# Patient Record
Sex: Male | Born: 1955 | Race: White | Hispanic: No | Smoking: Former smoker
Health system: Southern US, Community
[De-identification: ages and names within clinical notes are randomized; demographics above are authoritative.]

## PROBLEM LIST (undated history)

## (undated) DIAGNOSIS — Z860100 Personal history of colon polyps, unspecified: Secondary | ICD-10-CM

## (undated) DIAGNOSIS — Z8249 Family history of ischemic heart disease and other diseases of the circulatory system: Secondary | ICD-10-CM

## (undated) DIAGNOSIS — M1990 Unspecified osteoarthritis, unspecified site: Secondary | ICD-10-CM

## (undated) DIAGNOSIS — I719 Aortic aneurysm of unspecified site, without rupture: Secondary | ICD-10-CM

## (undated) DIAGNOSIS — J189 Pneumonia, unspecified organism: Secondary | ICD-10-CM

## (undated) DIAGNOSIS — I739 Peripheral vascular disease, unspecified: Secondary | ICD-10-CM

## (undated) DIAGNOSIS — R519 Headache, unspecified: Secondary | ICD-10-CM

## (undated) DIAGNOSIS — I10 Essential (primary) hypertension: Secondary | ICD-10-CM

## (undated) DIAGNOSIS — N189 Chronic kidney disease, unspecified: Secondary | ICD-10-CM

## (undated) DIAGNOSIS — R0600 Dyspnea, unspecified: Secondary | ICD-10-CM

## (undated) DIAGNOSIS — I2510 Atherosclerotic heart disease of native coronary artery without angina pectoris: Secondary | ICD-10-CM

## (undated) DIAGNOSIS — G5602 Carpal tunnel syndrome, left upper limb: Secondary | ICD-10-CM

## (undated) DIAGNOSIS — I671 Cerebral aneurysm, nonruptured: Secondary | ICD-10-CM

## (undated) DIAGNOSIS — K219 Gastro-esophageal reflux disease without esophagitis: Secondary | ICD-10-CM

## (undated) DIAGNOSIS — C6290 Malignant neoplasm of unspecified testis, unspecified whether descended or undescended: Secondary | ICD-10-CM

## (undated) DIAGNOSIS — D352 Benign neoplasm of pituitary gland: Secondary | ICD-10-CM

## (undated) HISTORY — DX: Malignant neoplasm of unspecified testis, unspecified whether descended or undescended: C62.90

## (undated) HISTORY — DX: Personal history of colonic polyps: Z86.010

## (undated) HISTORY — DX: Personal history of colon polyps, unspecified: Z86.0100

## (undated) HISTORY — DX: Unspecified osteoarthritis, unspecified site: M19.90

## (undated) HISTORY — PX: INGUINAL HERNIA REPAIR: SUR1180

## (undated) HISTORY — PX: OTHER SURGICAL HISTORY: SHX169

## (undated) HISTORY — PX: CARPAL TUNNEL RELEASE: SHX101

## (undated) HISTORY — DX: Family history of ischemic heart disease and other diseases of the circulatory system: Z82.49

## (undated) HISTORY — PX: MOHS SURGERY: SUR867

## (undated) HISTORY — DX: Benign neoplasm of pituitary gland: D35.2

## (undated) HISTORY — PX: APPENDECTOMY: SHX54

## (undated) HISTORY — PX: TONSILLECTOMY: SUR1361

## (undated) HISTORY — DX: Cerebral aneurysm, nonruptured: I67.1

## (undated) HISTORY — DX: Essential (primary) hypertension: I10

## (undated) HISTORY — PX: JOINT REPLACEMENT: SHX530

---

## 1976-10-11 DIAGNOSIS — C6290 Malignant neoplasm of unspecified testis, unspecified whether descended or undescended: Secondary | ICD-10-CM

## 1976-10-11 HISTORY — PX: OTHER SURGICAL HISTORY: SHX169

## 1976-10-11 HISTORY — PX: ABDOMINAL EXPLORATION SURGERY: SHX538

## 1976-10-11 HISTORY — DX: Malignant neoplasm of unspecified testis, unspecified whether descended or undescended: C62.90

## 1999-12-22 ENCOUNTER — Emergency Department (HOSPITAL_COMMUNITY): Admission: EM | Admit: 1999-12-22 | Discharge: 1999-12-22 | Payer: Self-pay | Admitting: Emergency Medicine

## 1999-12-22 ENCOUNTER — Encounter: Payer: Self-pay | Admitting: Emergency Medicine

## 2003-05-12 DIAGNOSIS — D352 Benign neoplasm of pituitary gland: Secondary | ICD-10-CM | POA: Insufficient documentation

## 2003-05-12 DIAGNOSIS — D353 Benign neoplasm of craniopharyngeal duct: Secondary | ICD-10-CM

## 2003-05-12 HISTORY — DX: Benign neoplasm of pituitary gland: D35.2

## 2003-05-23 ENCOUNTER — Ambulatory Visit (HOSPITAL_COMMUNITY): Admission: RE | Admit: 2003-05-23 | Discharge: 2003-05-23 | Payer: Self-pay | Admitting: Internal Medicine

## 2003-05-23 ENCOUNTER — Encounter: Payer: Self-pay | Admitting: Internal Medicine

## 2003-06-12 HISTORY — PX: OTHER SURGICAL HISTORY: SHX169

## 2005-06-04 ENCOUNTER — Ambulatory Visit: Payer: Self-pay | Admitting: Internal Medicine

## 2005-12-07 ENCOUNTER — Ambulatory Visit: Payer: Self-pay | Admitting: Internal Medicine

## 2006-03-22 ENCOUNTER — Ambulatory Visit: Payer: Self-pay | Admitting: Internal Medicine

## 2006-06-07 ENCOUNTER — Ambulatory Visit: Payer: Self-pay | Admitting: Internal Medicine

## 2006-06-07 LAB — CONVERTED CEMR LAB: PSA: 0.62 ng/mL

## 2006-12-07 ENCOUNTER — Ambulatory Visit: Payer: Self-pay | Admitting: Internal Medicine

## 2006-12-07 LAB — CONVERTED CEMR LAB
Basophils Absolute: 0 10*3/uL (ref 0.0–0.1)
Basophils Relative: 0.3 % (ref 0.0–1.0)
Eosinophils Absolute: 0.3 10*3/uL (ref 0.0–0.6)
Eosinophils Relative: 2.6 % (ref 0.0–5.0)
H Pylori IgG: NEGATIVE
HCT: 48.5 % (ref 39.0–52.0)
Hemoglobin: 16.8 g/dL (ref 13.0–17.0)
Lymphocytes Relative: 21.6 % (ref 12.0–46.0)
MCHC: 34.7 g/dL (ref 30.0–36.0)
MCV: 93.4 fL (ref 78.0–100.0)
Monocytes Absolute: 0.5 10*3/uL (ref 0.2–0.7)
Monocytes Relative: 4.3 % (ref 3.0–11.0)
Neutro Abs: 7.5 10*3/uL (ref 1.4–7.7)
Neutrophils Relative %: 71.2 % (ref 43.0–77.0)
Platelets: 198 10*3/uL (ref 150–400)
RBC: 5.2 M/uL (ref 4.22–5.81)
RDW: 12.2 % (ref 11.5–14.6)
WBC: 10.6 10*3/uL — ABNORMAL HIGH (ref 4.5–10.5)

## 2006-12-09 ENCOUNTER — Ambulatory Visit: Payer: Self-pay | Admitting: Surgery

## 2006-12-10 HISTORY — PX: ESOPHAGOGASTRODUODENOSCOPY: SHX1529

## 2006-12-14 ENCOUNTER — Ambulatory Visit: Payer: Self-pay | Admitting: Internal Medicine

## 2007-01-02 ENCOUNTER — Ambulatory Visit: Payer: Self-pay | Admitting: Unknown Physician Specialty

## 2007-06-08 ENCOUNTER — Encounter: Payer: Self-pay | Admitting: Internal Medicine

## 2007-06-08 DIAGNOSIS — I10 Essential (primary) hypertension: Secondary | ICD-10-CM | POA: Insufficient documentation

## 2007-06-08 DIAGNOSIS — I671 Cerebral aneurysm, nonruptured: Secondary | ICD-10-CM | POA: Insufficient documentation

## 2007-06-08 DIAGNOSIS — Z8547 Personal history of malignant neoplasm of testis: Secondary | ICD-10-CM | POA: Insufficient documentation

## 2007-06-14 ENCOUNTER — Ambulatory Visit: Payer: Self-pay | Admitting: Unknown Physician Specialty

## 2007-06-22 ENCOUNTER — Ambulatory Visit: Payer: Self-pay | Admitting: Internal Medicine

## 2007-06-22 DIAGNOSIS — N529 Male erectile dysfunction, unspecified: Secondary | ICD-10-CM | POA: Insufficient documentation

## 2007-06-23 LAB — CONVERTED CEMR LAB
ALT: 32 units/L (ref 0–53)
AST: 25 units/L (ref 0–37)
Albumin: 3.9 g/dL (ref 3.5–5.2)
Alkaline Phosphatase: 79 units/L (ref 39–117)
BUN: 13 mg/dL (ref 6–23)
Basophils Absolute: 0 10*3/uL (ref 0.0–0.1)
Basophils Relative: 0.3 % (ref 0.0–1.0)
Bilirubin, Direct: 0.2 mg/dL (ref 0.0–0.3)
CO2: 27 meq/L (ref 19–32)
Calcium: 9 mg/dL (ref 8.4–10.5)
Chloride: 105 meq/L (ref 96–112)
Creatinine, Ser: 1.3 mg/dL (ref 0.4–1.5)
Eosinophils Absolute: 0.2 10*3/uL (ref 0.0–0.6)
Eosinophils Relative: 2.5 % (ref 0.0–5.0)
Free T4: 0.6 ng/dL (ref 0.6–1.6)
GFR calc Af Amer: 75 mL/min
GFR calc non Af Amer: 62 mL/min
Glucose, Bld: 88 mg/dL (ref 70–99)
HCT: 46.7 % (ref 39.0–52.0)
Hemoglobin: 16.3 g/dL (ref 13.0–17.0)
Lymphocytes Relative: 25.3 % (ref 12.0–46.0)
MCHC: 35 g/dL (ref 30.0–36.0)
MCV: 93.5 fL (ref 78.0–100.0)
Monocytes Absolute: 0.5 10*3/uL (ref 0.2–0.7)
Monocytes Relative: 4.7 % (ref 3.0–11.0)
Neutro Abs: 6.6 10*3/uL (ref 1.4–7.7)
Neutrophils Relative %: 67.2 % (ref 43.0–77.0)
Phosphorus: 3.7 mg/dL (ref 2.3–4.6)
Platelets: 177 10*3/uL (ref 150–400)
Potassium: 4.7 meq/L (ref 3.5–5.1)
Prolactin: 5.6 ng/mL
RBC: 4.99 M/uL (ref 4.22–5.81)
RDW: 12 % (ref 11.5–14.6)
Sodium: 138 meq/L (ref 135–145)
TSH: 1.49 microintl units/mL (ref 0.35–5.50)
Total Bilirubin: 0.9 mg/dL (ref 0.3–1.2)
Total Protein: 5.9 g/dL — ABNORMAL LOW (ref 6.0–8.3)
WBC: 9.8 10*3/uL (ref 4.5–10.5)

## 2008-01-11 ENCOUNTER — Telehealth: Payer: Self-pay | Admitting: Internal Medicine

## 2008-01-22 ENCOUNTER — Telehealth (INDEPENDENT_AMBULATORY_CARE_PROVIDER_SITE_OTHER): Payer: Self-pay | Admitting: *Deleted

## 2008-07-11 ENCOUNTER — Encounter: Payer: Self-pay | Admitting: Family Medicine

## 2008-07-12 ENCOUNTER — Ambulatory Visit: Payer: Self-pay | Admitting: Family Medicine

## 2008-07-12 DIAGNOSIS — K219 Gastro-esophageal reflux disease without esophagitis: Secondary | ICD-10-CM | POA: Insufficient documentation

## 2008-07-17 ENCOUNTER — Ambulatory Visit: Payer: Self-pay | Admitting: Cardiovascular Disease

## 2008-07-24 ENCOUNTER — Ambulatory Visit: Payer: Self-pay | Admitting: Cardiology

## 2008-07-24 ENCOUNTER — Ambulatory Visit: Payer: Self-pay

## 2008-07-30 ENCOUNTER — Ambulatory Visit: Payer: Self-pay | Admitting: Cardiology

## 2008-09-20 ENCOUNTER — Encounter: Payer: Self-pay | Admitting: Internal Medicine

## 2008-11-29 ENCOUNTER — Ambulatory Visit: Payer: Self-pay | Admitting: Family Medicine

## 2008-11-29 DIAGNOSIS — K5732 Diverticulitis of large intestine without perforation or abscess without bleeding: Secondary | ICD-10-CM | POA: Insufficient documentation

## 2008-11-29 LAB — CONVERTED CEMR LAB
Bilirubin Urine: NEGATIVE
Blood in Urine, dipstick: NEGATIVE
Glucose, Urine, Semiquant: NEGATIVE
Ketones, urine, test strip: NEGATIVE
Nitrite: NEGATIVE
Specific Gravity, Urine: 1.01
Urobilinogen, UA: 0.2
WBC Urine, dipstick: NEGATIVE
pH: 6

## 2008-12-02 LAB — CONVERTED CEMR LAB
Basophils Absolute: 0 10*3/uL (ref 0.0–0.1)
Basophils Relative: 0 % (ref 0.0–3.0)
Eosinophils Absolute: 0.2 10*3/uL (ref 0.0–0.7)
Eosinophils Relative: 2.5 % (ref 0.0–5.0)
HCT: 48.4 % (ref 39.0–52.0)
Hemoglobin: 17 g/dL (ref 13.0–17.0)
Lymphocytes Relative: 25.6 % (ref 12.0–46.0)
MCHC: 35.1 g/dL (ref 30.0–36.0)
MCV: 94.6 fL (ref 78.0–100.0)
Monocytes Absolute: 0.3 10*3/uL (ref 0.1–1.0)
Monocytes Relative: 3.5 % (ref 3.0–12.0)
Neutro Abs: 5.7 10*3/uL (ref 1.4–7.7)
Neutrophils Relative %: 68.4 % (ref 43.0–77.0)
Platelets: 184 10*3/uL (ref 150–400)
RBC: 5.11 M/uL (ref 4.22–5.81)
RDW: 12.1 % (ref 11.5–14.6)
WBC: 8.4 10*3/uL (ref 4.5–10.5)

## 2009-07-09 ENCOUNTER — Ambulatory Visit: Payer: Self-pay | Admitting: Internal Medicine

## 2009-07-23 ENCOUNTER — Ambulatory Visit: Payer: Self-pay | Admitting: Cardiology

## 2009-07-30 ENCOUNTER — Encounter: Payer: Self-pay | Admitting: Cardiology

## 2009-07-30 ENCOUNTER — Ambulatory Visit: Payer: Self-pay | Admitting: Cardiology

## 2009-08-01 LAB — CONVERTED CEMR LAB
AST: 17 units/L (ref 0–37)
Albumin: 4.1 g/dL (ref 3.5–5.2)
Alkaline Phosphatase: 81 units/L (ref 39–117)
BUN: 15 mg/dL (ref 6–23)
Calcium: 8.8 mg/dL (ref 8.4–10.5)
Chloride: 108 meq/L (ref 96–112)
Creatinine, Ser: 1.13 mg/dL (ref 0.40–1.50)
Glucose, Bld: 100 mg/dL — ABNORMAL HIGH (ref 70–99)
HDL: 40 mg/dL (ref >39)
TSH: 1.608 microintl units/mL (ref 0.350–4.500)
Total CHOL/HDL Ratio: 5.1
Triglycerides: 243 mg/dL — ABNORMAL HIGH (ref <150)

## 2009-08-04 ENCOUNTER — Telehealth: Payer: Self-pay | Admitting: Cardiology

## 2009-09-22 ENCOUNTER — Ambulatory Visit: Payer: Self-pay | Admitting: Internal Medicine

## 2009-09-22 ENCOUNTER — Encounter: Payer: Self-pay | Admitting: Cardiology

## 2009-09-22 DIAGNOSIS — E785 Hyperlipidemia, unspecified: Secondary | ICD-10-CM | POA: Insufficient documentation

## 2009-09-24 LAB — CONVERTED CEMR LAB
ALT: 24 units/L (ref 0–53)
BUN: 12 mg/dL (ref 6–23)
CO2: 23 meq/L (ref 19–32)
Calcium: 8.9 mg/dL (ref 8.4–10.5)
Chloride: 106 meq/L (ref 96–112)
Cholesterol: 211 mg/dL — ABNORMAL HIGH (ref 0–200)
Creatinine, Ser: 1.1 mg/dL (ref 0.40–1.50)
Glucose, Bld: 94 mg/dL (ref 70–99)
HDL: 41 mg/dL (ref >39)
Total Bilirubin: 0.4 mg/dL (ref 0.3–1.2)
Total CHOL/HDL Ratio: 5.1
Triglycerides: 155 mg/dL — ABNORMAL HIGH (ref <150)
VLDL: 31 mg/dL (ref 0–40)

## 2010-01-06 ENCOUNTER — Ambulatory Visit: Payer: Self-pay | Admitting: Internal Medicine

## 2010-01-07 LAB — CONVERTED CEMR LAB: Free T4: 0.7 ng/dL (ref 0.6–1.6)

## 2010-08-05 ENCOUNTER — Ambulatory Visit: Payer: Self-pay | Admitting: Internal Medicine

## 2010-08-05 DIAGNOSIS — R42 Dizziness and giddiness: Secondary | ICD-10-CM | POA: Insufficient documentation

## 2010-08-11 ENCOUNTER — Telehealth: Payer: Self-pay | Admitting: Internal Medicine

## 2010-09-28 ENCOUNTER — Encounter: Payer: Self-pay | Admitting: Internal Medicine

## 2010-11-10 NOTE — Assessment & Plan Note (Signed)
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--increased work but more success also  Still smoking Terrible reaction to chantix Did quit on patch in 2002  No real exercise--hopes to go bird hunting  Has a place behind left ear Long standing--since he was a child Now somewhat larger Notes slight discomfort at it  Preventive Screening-Counseling & Management  Alcohol-Tobacco     Smoking Status: current  Allergies: 1)  Chantix (Varenicline Tartrate) 2)  Wellbutrin (Bupropion Hcl) 3)  Zegerid (Omeprazole-Sodium Bicarbonate)  Past History:  Past medical, surgical, family and social histories (including risk factors) reviewed for relevance to current acute and chronic problems.  Past Medical History: Reviewed history from 11/29/2008 and no changes required. 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 Bil. ing hernia - child Tonsillectomy Right testes removed 1978 Abd. exploration  1978 Prolactinoma excision [name]) 09/04 Colon/EGD - hiatal hernia 03/08 Appendectomy (at same time as lymph node dissection)  Stress Nuclear neg, 10/09  Family History: Reviewed history from 06/08/2007 and no changes required. Dad died of suicide.  Had glioblastoma Pat aunt died of lung cancer Brother with melanoma Cancer strong in family  Social  History: Reviewed history from 11/29/2008 and no changes required. Marital Status: Divorced Long term monogamous relationship Children: None Occupation: Public relations account executive Alcohol use-yes Current Smoker  Review of Systems General:  Denies sleep disorder; weight stable wears seat belt. Eyes:  Denies double vision and vision loss-1 eye. ENT:  Complains of decreased hearing and ringing in ears; no change in chronic symptoms Uses hearing protection teeth okay--regular with dentist. CV:  Complains of chest pain or discomfort and lightheadness; denies difficulty breathing at night, difficulty breathing while lying down, fainting, palpitations, and shortness of breath with exertion; occ tightness in chest if he gets tense. Resp:  Complains of cough; denies shortness of breath; AM congestion but then clears. GI:  Complains of indigestion; denies abdominal pain, bloody stools, change in bowel habits, dark tarry stools, nausea, and vomiting; more reflux due to stress and no exercise using OTC acid reducer with good result (ranitidine). GU:  Complains of erectile dysfunction; denies urinary frequency and urinary hesitancy; still uses cialis---not as good as viagra but he got flushed with it. MS:  Complains of joint pain; denies joint swelling; occ knee and shoulder pain--no meds. Derm:  Denies lesion(s) and rash. Neuro:  Complains of headaches; denies weakness; left arm numbness if holds it wrong--quickly resolves occ headaches--sinus?. Psych:  Denies anxiety and depression; no ongoing mood issues despite stress Rarely uses valium to help sleep. Heme:  Denies abnormal bruising and enlarge lymph nodes. Allergy:  Complains of seasonal allergies and sneezing; seens to have some spring symptoms.  Physical Exam  General:  alert and normal appearance.   Eyes:  pupils equal, pupils round, pupils reactive  to light, and no optic disk abnormalities.   Ears:  R ear normal and L ear normal.   Mouth:   no erythema and no lesions.   Neck:  supple, no masses, no thyromegaly, no carotid bruits, and no cervical lymphadenopathy.   Lungs:  normal respiratory effort and normal breath sounds.   Heart:  normal rate, regular rhythm, no murmur, and no gallop.   Abdomen:  soft and non-tender.   Rectal:  no hemorrhoids and no masses.   Prostate:  no gland enlargement and no nodules.   Msk:  no joint tenderness and no joint swelling.   Pulses:  2+ in feet Extremities:  no edema Neurologic:  alert & oriented X3, strength normal in all extremities, and gait normal.   Skin:  no rashes and no suspicious lesions.   Has  ~1cm raised lesion behind left ear (?dermatofibroma) Axillary Nodes:  No palpable lymphadenopathy Psych:  normally interactive, good eye contact, not anxious appearing, and not depressed appearing.     Impression & Recommendations:  Problem # 1:  PREVENTIVE HEALTH CARE (ICD-V70.0) Assessment Comment Only discussed exercise and smoking cessation due for PSA  Problem # 2:  HYPERTENSION (ICD-401.9) Assessment: Unchanged reasonable though suboptimal control he should be able to bring it down with lifestyle measures  His updated medication list for this problem includes:    Amlodipine Besylate 5 Mg Tabs (Amlodipine besylate) .Marland Kitchen... 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 1/2 -1 tab about 1 hour before sex 2)  Amlodipine Besylate 5 Mg Tabs (Amlodipine besylate) .Marland Kitchen.. 1 tab by mouth daily for high blood pressure 3)  Aspirin 81 Mg Tbec (Aspirin) .... Take one tablet by mouth daily as needed 4)  Niacin Cr 1000 Mg Cr-tabs (Niacin) .... 2 tabs daily 5)  Vitamin E 1000 Unit Caps (Vitamin e) .... Takes 4000 units once daily 6)  Acid Reducer 75 Mg Tabs (Ranitidine hcl) .Marland Kitchen.. 1 daily  Other Orders: TLB-PSA (Prostate Specific Antigen) (84153-PSA)  Patient Instructions: 1)  Please try loratadine 10mg  1-2 tabs daily for allergy symptoms 2)  Please stop smoking using the patch for 2-3 months 3)  Please schedule a follow-up appointment in 1 year for physical  Current Allergies (reviewed today): CHANTIX (VARENICLINE TARTRATE) WELLBUTRIN (BUPROPION HCL) ZEGERID (OMEPRAZOLE-SODIUM BICARBONATE)  Appended Document: CPX/DLO Blood work is fine Thyroid and prostate tests are both normal

## 2010-11-10 NOTE — Progress Notes (Signed)
Summary: pt requests phone call  Phone Note Call from Patient Call back at Home Phone [phone]   Caller: Patient Call For: Cindee Salt MD Summary of Call: Pt is asking that you call him today, he wants to discuss the meds that you gave him for vertigo. Initial call taken by: Lowella Petties CMA, AAMA,  August 11, 2010 10:02 AM  Follow-up for Phone Call        message left Cindee Salt MD  August 11, 2010 1:54 PM   Seems to have had some improvement with the meclizine but does make him a bit groggy Esp bad in AM when he first moves his head Has had better relief with baby aspirin--taking two times a day Okay to continue that and reduce daytime meclizine to help sedation can expect he will be able to wean off these in the upcoming time Follow-up by: Cindee Salt MD,  August 12, 2010 8:03 AM

## 2010-11-10 NOTE — Assessment & Plan Note (Signed)
Summary: ?VERTIGO/CLE   Vital Signs:  Patient profile:   55 year old male Weight:      216 pounds BMI:     32.96 Temp:     98.5 degrees F oral Pulse rate:   76 / minute Pulse rhythm:   regular BP sitting:   138 / 88  (left arm) Cuff size:   large  Vitals Entered By: Mervin Hack CMA Duncan Dull) (August 05, 2010 9:38 AM) CC: DIZZY   History of Present Illness: Having bad vertigo Started about 2 weeks ago at boat show in [place] felt swimmy headed while brushing teeth seemed to get better after shower and coffee  Now gets worse anytime he lies down horizontal Has had some nausea  No true rotatory symptoms but does have sense of movement  Did try baby aspirin once and he felt better 30 minutes later Tried single bonine without much help  No sinus symptoms BP has been okay  Chronic tinnitus and hearing loss  Allergies: 1)  Chantix (Varenicline Tartrate) 2)  Wellbutrin (Bupropion Hcl) 3)  Zegerid (Omeprazole-Sodium Bicarbonate)  Past History:  Past medical, surgical, family and social histories (including risk factors) reviewed for relevance to current acute and chronic problems.  Past Medical History: Reviewed history from 11/29/2008 and no changes required. 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 Bil. ing hernia - child Tonsillectomy Right testes removed 1978 Abd. exploration  1978 Prolactinoma excision [name]) 09/04 Colon/EGD - hiatal hernia 03/08 Appendectomy (at same time as lymph node dissection)  Stress Nuclear neg, Aug 15, 2023  Family History: Dad died of suicide.  Had glioblastoma and aortic aneurysm Pat aunt died of lung cancer Brother with melanoma Cancer strong in family  Social History: Reviewed history from 01/06/2010  and no changes required. Marital Status: Divorced Long term monogamous relationship Children: None Occupation: Public relations account executive Alcohol use-yes Current Smoker  Review of Systems       weight is down a few pounds no edema No speech or swallowing problems No focal weakness  Physical Exam  General:  alert and normal appearance.   Eyes:  pupils equal, pupils round, pupils reactive to light, no optic disk abnormalities, and no nystagmus.   Mouth:  no erythema and no exudates.   Neck:  supple, no masses, no thyromegaly, no carotid bruits, and no cervical lymphadenopathy.   Lungs:  normal respiratory effort, no intercostal retractions, no accessory muscle use, and normal breath sounds.   Heart:  normal rate, regular rhythm, no murmur, and no gallop.   Extremities:  no edema Neurologic:  alert & oriented X3, cranial nerves II-XII intact, strength normal in all extremities, gait normal, finger-to-nose normal, and Romberg negative.   Psych:  normally interactive, good eye contact, not anxious appearing, and not depressed appearing.     Impression & Recommendations:  Problem # 1:  VERTIGO (ICD-780.4) Assessment New  clearly seems to be vestibular Nothing to suggest intracranial process or Meniere's neuro exam reassuring  discussed salt restriction  will try meclizine he did use one valium and he seemed to be worse  His updated medication list for this problem includes:    Meclizine Hcl 25 Mg Tabs (Meclizine hcl) .Marland Kitchen... 1  tab by mouth three times a day for vertigo. wean when symptoms are gone for several days  Complete Medication List: 1)  Cialis 20 Mg Tabs (Tadalafil) .... 1/2 -1 tab about 1 hour before sex 2)  Amlodipine Besylate 5 Mg Tabs (Amlodipine besylate) .Marland Kitchen.. 1 tab by mouth daily for high blood pressure 3)  Aspirin 81 Mg Tbec (Aspirin) .... Take one tablet by mouth daily as needed 4)  Niacin Cr 1000 Mg Cr-tabs (Niacin) .... 2 tabs daily 5)  Vitamin E 1000 Unit Caps  (Vitamin e) .... Takes 4000 units once daily 6)  Acid Reducer 75 Mg Tabs (Ranitidine hcl) .Marland Kitchen.. 1 daily 7)  Meclizine Hcl 25 Mg Tabs (Meclizine hcl) .Marland Kitchen.. 1  tab by mouth three times a day for vertigo. wean when symptoms are gone for several days  Patient Instructions: 1)  Please call for reevaluation if the vertigo doesn't get better 2)  Keep regular follow up Prescriptions: MECLIZINE HCL 25 MG TABS (MECLIZINE HCL) 1  tab by mouth three times a day for vertigo. Wean when symptoms are gone for several days  #90 x 1   Entered and Authorized by:   Cindee Salt MD   Signed by:   Cindee Salt MD on 08/05/2010   Method used:   Electronically to        [facility]  [address] [id number] (retail)       [address]       [place], [place]  [address]       Ph: [phone] or [phone]       Fax: [phone]   RxID:   [id number]    Orders Added: 1)  Est. Patient Level IV [32951]    Current Allergies (reviewed today): CHANTIX (VARENICLINE TARTRATE) WELLBUTRIN (BUPROPION HCL) ZEGERID (OMEPRAZOLE-SODIUM BICARBONATE)

## 2010-11-12 NOTE — Letter (Signed)
Summary: Cancer Registry Form/[facility]  Cancer Registry Form/[facility]   Imported By: Lanelle Bal 10/06/2010 15:12:11  _____________________________________________________________________  External Attachment:    Type:   Image     Comment:   External Document

## 2010-11-30 ENCOUNTER — Other Ambulatory Visit: Payer: Self-pay | Admitting: Family Medicine

## 2010-11-30 ENCOUNTER — Ambulatory Visit (INDEPENDENT_AMBULATORY_CARE_PROVIDER_SITE_OTHER): Payer: 59 | Admitting: Family Medicine

## 2010-11-30 ENCOUNTER — Encounter: Payer: Self-pay | Admitting: Family Medicine

## 2010-11-30 DIAGNOSIS — R42 Dizziness and giddiness: Secondary | ICD-10-CM

## 2010-11-30 DIAGNOSIS — R269 Unspecified abnormalities of gait and mobility: Secondary | ICD-10-CM | POA: Insufficient documentation

## 2010-12-01 ENCOUNTER — Encounter: Payer: Self-pay | Admitting: Family Medicine

## 2010-12-02 ENCOUNTER — Encounter (INDEPENDENT_AMBULATORY_CARE_PROVIDER_SITE_OTHER): Payer: Self-pay | Admitting: *Deleted

## 2010-12-02 ENCOUNTER — Other Ambulatory Visit: Payer: Self-pay | Admitting: Internal Medicine

## 2010-12-02 ENCOUNTER — Other Ambulatory Visit (INDEPENDENT_AMBULATORY_CARE_PROVIDER_SITE_OTHER): Payer: 59

## 2010-12-02 ENCOUNTER — Encounter: Payer: Self-pay | Admitting: Internal Medicine

## 2010-12-02 DIAGNOSIS — R42 Dizziness and giddiness: Secondary | ICD-10-CM

## 2010-12-02 LAB — CREATININE, SERUM: Creatinine, Ser: 1.3 mg/dL (ref 0.4–1.5)

## 2010-12-03 ENCOUNTER — Encounter: Payer: Self-pay | Admitting: Family Medicine

## 2010-12-03 ENCOUNTER — Ambulatory Visit
Admission: RE | Admit: 2010-12-03 | Discharge: 2010-12-03 | Disposition: A | Discharge status: Home / Self Care | Payer: 59 | Source: Ambulatory Visit | Attending: Family Medicine | Admitting: Family Medicine

## 2010-12-03 DIAGNOSIS — R42 Dizziness and giddiness: Secondary | ICD-10-CM

## 2010-12-03 MED ORDER — GADOBENATE DIMEGLUMINE 529 MG/ML IV SOLN
20.0000 mL | Freq: Once | INTRAVENOUS | Status: AC | PRN
  Administered 2010-12-03: 20 mL via INTRAVENOUS

## 2010-12-08 NOTE — Assessment & Plan Note (Signed)
Summary: VERTIGO   Vital Signs:  Patient profile:   55 year old male Height:      68 inches Weight:      221 pounds BMI:     33.72 Temp:     98.1 degrees F oral Pulse rate:   76 / minute Pulse rhythm:   regular BP sitting:   120 / 90  (left arm) Cuff size:   large  Vitals Entered By: Benny Lennert CMA Duncan Dull) (November 30, 2010 12:32 PM)  History of Present Illness: 55 year old male with notable history for prolactinoma s/p resection in 2004 and a father with Glioblastoma dx at 64 who presents with a 3 day history of acute vertigo, but with vertigo sensations for 5 months with several exacerbations.   ome vertigo in the fall. woke up yesterday omrning, and two mornings in a row. Now has lasted up into the morning. Dizzy here just sitting. At various times with severe vertigo, awoke from the morning.   Took a meclizine a couple of hours ago. Made his sensations improve somewhat.   Distantly, had a problem for the first time [place]. then had some relatively brief amount of dizziness.   He may be coming down with a viral illness as well.    Allergies: 1)  Chantix (Varenicline Tartrate) 2)  Wellbutrin (Bupropion Hcl) 3)  Zegerid (Omeprazole-Sodium Bicarbonate)  Past History:  Past medical, surgical, family and social histories (including risk factors) reviewed, and no changes noted (except as noted below).  Past Medical History: Reviewed history from 11/29/2008 and no changes required. 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 Bil. ing hernia - child Tonsillectomy Right testes removed 1978 Abd. exploration  1978 Prolactinoma excision [name]) 09/04 Colon/EGD - hiatal hernia 03/08 Appendectomy (at same time as lymph node dissection)  Stress Nuclear neg, 10/09   Family  History: Reviewed history from 08/05/2010 and no changes required. Dad died of suicide.  Had glioblastoma and aortic aneurysm Pat aunt died of lung cancer Brother with melanoma Cancer strong in family  Social History: Reviewed history from 01/06/2010 and no changes required. Marital Status: Divorced Long term monogamous relationship Children: None Occupation: Public relations account executive Alcohol use-yes Current Smoker  Review of Systems      See HPI General:  Complains of fatigue; denies chills and fever. GI:  Complains of nausea. Neuro:  Complains of disturbances in coordination, poor balance, and sensation of room spinning; denies inability to speak, memory loss, numbness, and tremors.  Physical Exam  General:  Well-developed,well-nourished,in no acute distress; alert,appropriate and cooperative throughout examination Head:  normocephalic and atraumatic.   Ears:  no external deformities.   LAID SUPINE, WITH RAPID HEAD ROTATION, NO NYSTAGMUS, BUT WITH SEVERE VERTIGO ON SITTING UP. Mouth:  Oral mucosa and oropharynx without lesions or exudates.  Teeth in good repair. Neck:  No deformities, masses, or tenderness noted. Lungs:  normal respiratory effort.   Psych:  Cognition and judgment appear intact. Alert and cooperative with normal attention span and concentration. No apparent delusions, illusions, hallucinations   Detailed Neurologic Exam  Speech:    Speech is normal; fluent and spontaneous with normal comprehension Cognition:    The patient is oriented to person, place, and time; memory intact; language fluent; normal attention, concentration, and fund of knowledge Cranial Nerves:    The pupils are equal, round, and reactive to light. The  fundi are normal and spontaneous venous pulsations are present. Visual fields are full to finger confrontation. Extraocular movements are intact. Trigeminal sensation is intact and the muscles of mastication are normal. The face is symmetric.  The palate elevates in the midline. Voice is normal. Shoulder shrug is normal. The tongue has normal motion without fasciculations.  Coordination:    finger nose normal romberg normal  Gait:    UNABLE TO COMPLETE HEEL TO TOE WALKING, IMBALANCE Observation:    No asymmetry, no atrophy, and no involuntary movements noted.   Tone:    Normal muscle tone.  Posture:    Posture is normal.  Strength:    Strength is V/V in the upper and lower limbs.  Light Touch:    Normal light touch sensation in upper and lower extremities.  Reflex Exam: DTR's:    Deep tendon reflexes in the upper and lower extremities are normal bilaterally.     Impression & Recommendations:  Problem # 1:  VERTIGO (ICD-780.4) Assessment Deteriorated 5 month history of vertigo in a patient with a history of prolactinoma, s/p resection with a father with glioblastoma. Abnormal neurological exam with failed cerebellar testing. May be due to inner ear process, but with this case, MRI of the brain without contrast to evaluate for potential tumor, to evaluate CN8 is needed given risk.  for now, rest, meclizine, valium over the next few days.  His updated medication list for this problem includes:    Meclizine Hcl 25 Mg Tabs (Meclizine hcl) .Marland Kitchen... 1  tab by mouth three times a day for vertigo. wean when symptoms are gone for several days  Orders: Radiology Referral (Radiology)  Problem # 2:  GAIT DISTURBANCE (ICD-781.2) Assessment: New  Orders: Radiology Referral (Radiology)  Complete Medication List: 1)  Cialis 20 Mg Tabs (Tadalafil) .... 1/2 -1 tab about 1 hour before sex 2)  Amlodipine Besylate 5 Mg Tabs (Amlodipine besylate) .Marland Kitchen.. 1 tab by mouth daily for high blood pressure 3)  Aspirin 81 Mg Tbec (Aspirin) .... Take one tablet by mouth daily as needed 4)  Niacin Cr 1000 Mg Cr-tabs (Niacin) .... 2 tabs daily 5)  Vitamin E 1000 Unit Caps (Vitamin e) .... Takes 4000 units once daily 6)  Acid Reducer 75 Mg Tabs  (Ranitidine hcl) .Marland Kitchen.. 1 daily 7)  Meclizine Hcl 25 Mg Tabs (Meclizine hcl) .Marland Kitchen.. 1  tab by mouth three times a day for vertigo. wean when symptoms are gone for several days 8)  Diazepam 2 Mg Tabs (Diazepam) .Marland Kitchen.. 1 by mouth three times a day  Patient Instructions: 1)  Referral Appointment Information 2)  Day/Date: 3)  Time: 4)  Place/MD: 5)  Address: 6)  Phone/Fax: 7)  Patient given appointment information. Information/Orders faxed/mailed.  Prescriptions: DIAZEPAM 2 MG TABS (DIAZEPAM) 1 by mouth three times a day  #30 x 0   Entered and Authorized by:   Hannah Beat MD   Signed by:   Hannah Beat MD on 11/30/2010   Method used:   Print then Give to Patient   RxID:   [id number]    Orders Added: 1)  Radiology Referral [Radiology] 2)  Est. Patient Level IV [95621]    Current Allergies (reviewed today): CHANTIX (VARENICLINE TARTRATE) WELLBUTRIN (BUPROPION HCL) ZEGERID (OMEPRAZOLE-SODIUM BICARBONATE)

## 2010-12-22 NOTE — Letter (Signed)
Summary: [facility]   [facility]   Imported By: Kassie Mends 12/14/2010 11:00:16  _____________________________________________________________________  External Attachment:    Type:   Image     Comment:   External Document  Appended Document: [facility]  Epley manuever done for vertigo getting MRI

## 2010-12-29 NOTE — Letter (Signed)
Summary: [employer]   Imported By: Kassie Mends 12/21/2010 10:06:41  _____________________________________________________________________  External Attachment:    Type:   Image     Comment:   External Document

## 2011-02-23 NOTE — Assessment & Plan Note (Signed)
[facility]                            [place] OFFICE NOTE   NAME:[name], Dustin                          MRN:          [id number]  DATE:07/30/2008                            DOB:          10-28-1955    The patient comes in today for followup.  He saw Verne Carrow in  my absence.  He is a friend of the [place].   He had a stress Myoview, which showed an ejection fraction of 60% with  normal perfusion.  There was no ischemia.   He quit smoking for 6 days.  He complains of cold feet and poor  circulation.  His blood pressure has also been running a little high  around 140-155 over about 90.   His blood pressure today is 166/100 and his pulse is 76 and regular.  The rest of exam is unchanged.  He has good capillary refill bounding  posterior tibial and dorsalis pedis pulses.   He had been prescribed HCTZ in the past but makes him feel bad.   I have had a long discussion, I placed him on amlodipine 2.5 mg q.a.m.  He will watch his blood pressure.  Goal is less than 140 over less than  90.  If he is doing, we will see him back in a year or p.r.n.     Thomas C. Daleen Squibb, MD, [facility]  Electronically Signed    TCW/MedQ  DD: 07/30/2008  DT: 07/30/2008  Job #: [id number]   cc:   Karie Schwalbe, MD

## 2011-02-23 NOTE — Assessment & Plan Note (Signed)
[name]                            [place] OFFICE NOTE   NAME:Dustin [name], Dustin [name]                        MRN:          [id number]  DATE:07/17/2008                            DOB:          31-Aug-1956    HISTORY OF PRESENT ILLNESS:  Mr. Dustin [name] is a pleasant 55 year old  Caucasian male with a past medical history significant for tobacco  abuse, hypertension, mild obesity, hiatal hernia, prolactinoma and  testicular cancer who comes in to establish cardiology care with  complaints of one episode of chest discomfort 5 days ago.  The patient  was seen by Dr. Karleen Hampshire [name] in his primary care office on July 12, 2008, after he awoke that morning with left-sided chest pain.  The  patient describes it as a dull and aching sensation underneath his left  breast.  There was no radiation of the pain.  There were no associated  palpitations, although the pain did seem to cause mild shortness of  breath and worsened slightly when he would take in deep breath.  The  patient got up and worked a little, but had no resolution of the pain.  He came home and had another cup of coffee and relaxes and the pain  resolved on its own.  The pain lasted for approximately 2 hours.  There  was no associated diaphoresis, nausea, vomiting, dizziness, near-  syncope, or syncope.  He also denies any orthopnea, PND, or lower  extremity edema.  He tells me that he has not had chest pain like this  prior.  He does occasionally have bronchitis, which he relates to his  chronic use of tobacco.  Dr. Patsy [name] felt that the patient had some  wheezing on exam during his visit there 5 days ago.  The patient was  given azithromycin for possible bronchitis.  He tells me today that he  has had no recurrence of his chest pain at all.  He feels that his  breathing is back at baseline and says that he feels much better.  The  patient also tells me that he has had borderline hypertension over the  last several years.  Of note, he did have a stress test 5 years ago that  he tells me was normal.   PAST MEDICAL HISTORY:  1. Tobacco abuse with 2 packs per day for the last 35-40 years.  2. Hypertension.  3. Obesity.  4. Prolactinoma, status post removal.  5. Testicular cancer, status post removal of the right testes.  6. Hiatal hernia.  7. Bilateral inguinal hernia repair as a child.  8. Tonsillectomy.  9. Abdominal exploration.   ALLERGIES:  No known drug allergies.   MEDICATIONS:  None.   SOCIAL HISTORY:  The patient smokes 2 packs of cigarettes per day and  has done so for 35-40 years.  He admits to drinking 2-3 alcoholic  beverages per week.  He denies use of illicit drugs.  He is single and  has a long time live-in girlfriend.  He does not have any children.  He  is employed as a Astronomer.  FAMILY HISTORY:  The patient's father committed suicide after being  diagnosed with a brain tumor.  His father also had an abdominal aortic  aneurysm prior to his death.  His mother is alive and has  osteoarthritis.  He has 2 brothers that have no coronary artery disease.  There is no other family history that sounds significant for premature  coronary artery disease.   REVIEW OF SYSTEMS:  As stated in the history of present illness, is  otherwise negative.   PHYSICAL EXAMINATION:  GENERAL:  He is a pleasant middle-aged Caucasian  male, in no acute distress.  VITAL SIGNS:  Blood pressure 151/97, pulse 109 and regular, respirations  12 and nonlabored.  NECK:  No JVD.  No carotid bruits.  No thyromegaly, no lymphadenopathy.  SKIN:  Warm and dry.  HEENT:  Oropharynx clear.  Mucous membranes moist.  NEUROLOGIC:  Nonfocal.  Cranial nerves II through XII grossly intact.  LUNGS:  Clear to auscultation bilaterally with no wheezes, rhonchi, or  crackles noted.  CARDIOVASCULAR:  Regular rate and rhythm without murmurs, gallops, or  rubs noted.  ABDOMEN:  Soft, nontender, and  nondistended.  Bowel sounds are present.  EXTREMITIES:  No evidence of edema.  Pulses are 2+ in all extremities.   DIAGNOSTIC STUDIES:  A 12-lead electrocardiogram obtained in the office  of Dr. Patsy [name] on July 11, 2008, shows normal sinus rhythm with normal  axis.  There are no ischemic changes noted.  All the intervals are  within normal ranges.   ASSESSMENT AND PLAN:  This is a pleasant 55 year old Caucasian male with  several risk factors for coronary artery disease including tobacco  abuse, hypertension, and mild obesity who presents for evaluation of one  episode of left-sided chest discomfort that was associated with  shortness of breath, but no diaphoresis, nausea, dizziness, or  palpitations.  It seems like his symptoms on Friday were associated with  pleuritic-type pain and have improved since he was treated with  antibiotics for possible bronchitis.  I am still concerned about the  left-sided pain.  I feel that it would be best to proceed with an  exercise treadmill nuclear stress test.  We will set this up for some  time in the next week.  The patient's blood pressure is elevated today  and has been elevated the last several times it has been checked.  I  have talked about this and would like to start hydrochlorothiazide 25 mg  once daily.  I have also encouraged the patient start taking aspirin 81  mg once daily as well as a multivitamin daily.  I will plan on seeing  him back in this office in several weeks following his stress test.  I  have encouraged him to continue to follow in Dr. Cyndie [name] office for  his primary care needs.     Dustin Carrow, MD  Electronically Signed    CM/MedQ  DD: 07/17/2008  DT: 07/18/2008  Job #: [id number]   cc:   Dustin China, MD

## 2011-03-01 ENCOUNTER — Encounter: Payer: Self-pay | Admitting: Internal Medicine

## 2011-03-01 ENCOUNTER — Ambulatory Visit (INDEPENDENT_AMBULATORY_CARE_PROVIDER_SITE_OTHER): Payer: 59 | Admitting: Internal Medicine

## 2011-03-01 VITALS — BP 138/84 | HR 84 | Temp 98.8°F | Wt 215.2 lb

## 2011-03-01 DIAGNOSIS — R1033 Periumbilical pain: Secondary | ICD-10-CM

## 2011-03-01 DIAGNOSIS — F411 Generalized anxiety disorder: Secondary | ICD-10-CM

## 2011-03-01 MED ORDER — FLUOXETINE HCL (PMDD) 10 MG PO CAPS: 1.0000 | ORAL_CAPSULE | Freq: Every day | ORAL | Status: DC

## 2011-03-01 NOTE — Progress Notes (Signed)
Subjective:    Patient ID: Dustin [name], male    DOB: 1956-06-30, 55 y.o.   MRN: [id number]  HPI Was at [facility] for boat show Awoke 2 days ago and had coffee and Egg McMuffin Several hours later started having periumbilical discomfort Thought it might be hunger pains Worsened after [employer] Radiated up to epigastrium and some nausea  Went to ER with girlfriend He was fairly anxious by this time Used 1/2 of her xanax Has had worsening of anxiety Now with daily depression as well  Records from [facility] reviewed Was feeling cold then Did have some heaviness on chest at first--gone after vomiting Got med for nausea---then vomited and had diarrhea  Stools have been normal brown Notes clear drainage from rectum lately Generally gets this when he wipes Did feel better after vomiting Wanted to admit but cardiology eval didn't seem to think it was cardiac---so he left after cardiologist  Current outpatient prescriptions:amLODipine (NORVASC) 5 MG tablet, Take 5 mg by mouth daily.  , Disp: , Rfl: ;  aspirin 81 MG tablet, Take 81 mg by mouth once a week. , Disp: , Rfl: ;  diazepam (VALIUM) 2 MG tablet, Take by mouth 3 (three) times daily as needed. , Disp: , Rfl: ;  ranitidine (ZANTAC) 75 MG tablet, Take 75 mg by mouth daily.  , Disp: , Rfl:  tadalafil (CIALIS) 20 MG tablet, Take 1/2 to 1 tablet about 1 hour before sex , Disp: , Rfl: ;  Niacin CR 1000 MG TBCR, Take by mouth. Take 2 tablets by mouth daily , Disp: , Rfl: ;  vitamin E 1000 UNIT capsule, Take 4000 units once daily , Disp: , Rfl:   Past Medical History  Diagnosis Date  . History of colonic polyps   . Hypertension   . Cerebral aneurysm   . 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 Inguinal hernia repair child    bilateral  . Tonsillectomy   . Right testicle removed 1978  . Abdominal exploration surgery 1978  . Prolactinoma excision 09/04     [name]  . Esophagogastroduodenoscopy 03/08     and colon  . Appendectomy     at same time as lymph node dissection  . Stress nuclear negative    10/09    Family History  Problem Relation Age of Onset  . Cancer Brother     melanoma  . Cancer Paternal Aunt     lung  . Cancer Other     strong in family    History   Social History  . Marital Status: Significant Other    Spouse Name: N/A    Number of Children: 0  . 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 Smoking status: Current Everyday Smoker -- 1.5 packs/day for 40 years  . Smokeless tobacco: Not on file  . Alcohol Use: Yes     occassionally  . Drug Use: No  . Sexually Active: Not on file   Other Topics Concern  . Not on file   Social History Narrative   Cardiology -Dr. Clearnce Hasten, long term monogamous relationship   Review of Systems No similar episodes Was exposed to man at meeting who did have GI illness Does note indigestion with fried food---tums will resolve SOme chronic cough---relates to cigarettes and allergies No blood in stool  Objective:   Physical Exam  Constitutional: He appears well-developed and well-nourished. No distress.  Neck: Normal range of motion. Neck supple. No thyromegaly present.  Cardiovascular: Normal rate, regular rhythm, normal heart sounds and intact distal pulses.  Exam reveals no gallop.   No murmur heard. Pulmonary/Chest: Effort normal and breath sounds normal. No respiratory distress. He has no wheezes. He has no rales.  Abdominal: Soft. Bowel sounds are normal. He exhibits no mass. There is tenderness. There is no rebound and no guarding.       Mild non distinct tenderness on left side  Musculoskeletal: Normal range of motion. He exhibits no edema and no tenderness.  Lymphadenopathy:    He has no cervical adenopathy.  Psychiatric: His behavior is normal. Judgment and thought content normal.        Some anxiety Seems dysthymic          Assessment & Plan:

## 2011-03-20 ENCOUNTER — Encounter: Payer: Self-pay | Admitting: Internal Medicine

## 2011-03-22 ENCOUNTER — Encounter: Payer: Self-pay | Admitting: Internal Medicine

## 2011-03-22 ENCOUNTER — Ambulatory Visit (INDEPENDENT_AMBULATORY_CARE_PROVIDER_SITE_OTHER): Payer: 59 | Admitting: Internal Medicine

## 2011-03-22 VITALS — BP 128/80 | HR 86 | Temp 98.9°F | Ht 68.0 in | Wt 215.0 lb

## 2011-03-22 DIAGNOSIS — F419 Anxiety disorder, unspecified: Secondary | ICD-10-CM

## 2011-03-22 DIAGNOSIS — F39 Unspecified mood [affective] disorder: Secondary | ICD-10-CM | POA: Insufficient documentation

## 2011-03-22 DIAGNOSIS — F411 Generalized anxiety disorder: Secondary | ICD-10-CM

## 2011-03-22 NOTE — Assessment & Plan Note (Signed)
Ongoing throughout his life Prefers not to take the valium Has used girlfriend's xanax rarely Stomach spell likely anxiety related  Didn't do well with the fluoxetine due to ED Not excited about trying something else  Will hold off  See back in 3 months

## 2011-03-22 NOTE — Progress Notes (Signed)
Subjective:    Patient ID: Dustin [name], male    DOB: May 04, 1956, 55 y.o.   MRN: [id number]  HPI Has not had any problems with his stomach No further spells and eating okay  Did note some reduction in anger while on the prozac Stopped it last week due to severe ED  He notes ongoing mood problems that he feels are largely business related Prefers not to even take the valium he has  Admits he had some lack of energy, nothing "to look forward to" Some degree of anhedonia  Current Outpatient Prescriptions on File Prior to Visit  Medication Sig Dispense Refill  . amLODipine (NORVASC) 5 MG tablet Take 5 mg by mouth daily.        Marland Kitchen aspirin 81 MG tablet Take 81 mg by mouth once a week.       . diazepam (VALIUM) 2 MG tablet Take by mouth 3 (three) times daily as needed.       . ranitidine (ZANTAC) 75 MG tablet Take 75 mg by mouth daily.        . tadalafil (CIALIS) 20 MG tablet Take 1/2 to 1 tablet about 1 hour before sex       . DISCONTD: Fluoxetine HCl, PMDD, 10 MG CAPS Take 1 capsule (10 mg total) by mouth daily.  30 each  5  . DISCONTD: Niacin CR 1000 MG TBCR Take by mouth. Take 2 tablets by mouth daily        Past Medical History  Diagnosis Date  . History of colonic polyps   . Hypertension   . Cerebral aneurysm   . 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 Inguinal hernia repair child    bilateral  . Tonsillectomy   . Right testicle removed 1978  . Abdominal exploration surgery 1978  . Prolactinoma excision 09/04    [name]  . Esophagogastroduodenoscopy 03/08     and colon  . Appendectomy     at same time as lymph node dissection  . Stress nuclear negative    10/09    Family History  Problem Relation Age of Onset  . Cancer Brother     melanoma  . Cancer Paternal Aunt     lung  . Cancer Other     strong in family    History   Social History  . Marital Status: Significant Other    Spouse  Name: N/A    Number of Children: 0  . 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 Smoking status: Current Everyday Smoker -- 1.5 packs/day for 40 years  . Smokeless tobacco: Not on file  . Alcohol Use: Yes     occassionally  . Drug Use: No  . Sexually Active: Not on file   Other Topics Concern  . Not on file   Social History Narrative   Cardiology -Dr. 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 No distress.  Skin:       Skin tag in right groin Some noninflamed ingrown hair follicles in groin as well  Psychiatric: He has a normal mood and affect. His behavior is normal. Judgment and  thought content normal.          Assessment & Plan:

## 2011-03-26 ENCOUNTER — Encounter: Payer: Self-pay | Admitting: Cardiovascular Disease

## 2011-04-07 ENCOUNTER — Ambulatory Visit (INDEPENDENT_AMBULATORY_CARE_PROVIDER_SITE_OTHER): Payer: 59 | Admitting: Internal Medicine

## 2011-04-07 ENCOUNTER — Encounter: Payer: Self-pay | Admitting: Internal Medicine

## 2011-04-07 VITALS — BP 158/80 | HR 89 | Temp 98.6°F | Ht 68.0 in | Wt 215.0 lb

## 2011-04-07 DIAGNOSIS — R109 Unspecified abdominal pain: Secondary | ICD-10-CM

## 2011-04-07 DIAGNOSIS — N410 Acute prostatitis: Secondary | ICD-10-CM | POA: Insufficient documentation

## 2011-04-07 LAB — POCT URINALYSIS DIPSTICK
Ketones, UA: NEGATIVE
Leukocytes, UA: NEGATIVE
Nitrite, UA: NEGATIVE
Protein, UA: NEGATIVE
Urobilinogen, UA: 0.2

## 2011-04-07 MED ORDER — CIPROFLOXACIN HCL 500 MG PO TABS: 500.0000 mg | ORAL_TABLET | Freq: Two times a day (BID) | ORAL | Status: AC

## 2011-04-07 MED ORDER — HYDROCODONE-ACETAMINOPHEN 5-325 MG PO TABS: 1.0000 | ORAL_TABLET | Freq: Four times a day (QID) | ORAL | Status: AC | PRN

## 2011-04-07 NOTE — Assessment & Plan Note (Addendum)
Fairly classic presentation Urinalysis negative for blood and infection May be related to retrograde ejaculation but this is not new Will treat with cipro for 3 weeks Hydrocodone for pain---NSAIDs no help

## 2011-04-07 NOTE — Progress Notes (Signed)
Subjective:    Patient ID: Dustin [name], male    DOB: 03/14/1956, 55 y.o.   MRN: [id number]  HPI Has been having back pain radiating to front since 4 days ago Worried about stone but no history in past Some relief with hydrocodone Very stiff in AM  Increased fluids and may have helped some No dysuria or hematuria No urgency No nausea or vomiting  No sex since symptoms have started Chronic retrograde ejaculation  Current Outpatient Prescriptions on File Prior to Visit  Medication Sig Dispense Refill  . amLODipine (NORVASC) 5 MG tablet Take 5 mg by mouth daily.        Marland Kitchen aspirin 81 MG tablet Take 81 mg by mouth once a week.       . diazepam (VALIUM) 2 MG tablet Take by mouth 3 (three) times daily as needed.       . ranitidine (ZANTAC) 75 MG tablet Take 75 mg by mouth daily.        . tadalafil (CIALIS) 20 MG tablet Take 1/2 to 1 tablet about 1 hour before sex       . DISCONTD: meclizine (ANTIVERT) 25 MG tablet Take 25 mg by mouth 3 (three) times daily as needed.        Marland Kitchen DISCONTD: Niacin CR 1000 MG TBCR Take 2 tablets by mouth daily.        Marland Kitchen DISCONTD: vitamin E 1000 UNIT capsule Take 4,000 Units by mouth daily.          Allergies  Allergen Reactions  . Bupropion Hcl     REACTION: Mood swings  . Omeprazole-Sodium Bicarbonate     REACTION: Didn't feel good  . Varenicline Tartrate     Past Medical History  Diagnosis Date  . History of colonic polyps   . Hypertension   . Cerebral aneurysm   . 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 Inguinal hernia repair child    bilateral  . Tonsillectomy   . Right testicle removed 1978  . Abdominal exploration surgery 1978  . Prolactinoma excision 09/04    [name]  . Esophagogastroduodenoscopy 03/08     and colon  . Appendectomy     at same time as lymph node dissection  . Stress nuclear negative    10/09    Family History  Problem Relation Age of Onset   . Cancer Brother     melanoma  . Cancer Paternal Aunt     lung  . Cancer Other     strong in family    History   Social History  . Marital Status: Significant Other    Spouse Name: N/A    Number of Children: 0  . 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 Smoking status: Current Everyday Smoker -- 1.5 packs/day for 40 years  . Smokeless tobacco: Not on file  . Alcohol Use: Yes     occassionally  . Drug Use: No  . Sexually Active: Not on file   Other Topics Concern  . Not on file   Social History Narrative   Cardiology -Dr. Clearnce Hasten, long term monogamous relationship   Review of Systems No fever Appetite okay    Objective:   Physical Exam  Constitutional: He appears well-developed and well-nourished. No distress.  Abdominal:       Mild  suprapubic and LLQ tenderness  Genitourinary:       Scrotum quiet Right testis absent Prostate without nodule---moderate tenderness  Musculoskeletal:       Pain centered over mid lumbar back but no specific tenderness No CVA tenderness          Assessment & Plan:

## 2011-05-07 ENCOUNTER — Other Ambulatory Visit: Payer: Self-pay | Admitting: *Deleted

## 2011-05-07 MED ORDER — TADALAFIL 20 MG PO TABS: ORAL_TABLET | ORAL | Status: DC

## 2011-05-07 NOTE — Telephone Encounter (Signed)
LETVAK PATIENT, ok to refill? 

## 2011-05-07 NOTE — Telephone Encounter (Signed)
Sent in

## 2011-06-21 ENCOUNTER — Ambulatory Visit (INDEPENDENT_AMBULATORY_CARE_PROVIDER_SITE_OTHER): Payer: 59 | Admitting: Internal Medicine

## 2011-06-21 ENCOUNTER — Encounter: Payer: Self-pay | Admitting: Internal Medicine

## 2011-06-21 DIAGNOSIS — D1739 Benign lipomatous neoplasm of skin and subcutaneous tissue of other sites: Secondary | ICD-10-CM

## 2011-06-21 DIAGNOSIS — F17210 Nicotine dependence, cigarettes, uncomplicated: Secondary | ICD-10-CM | POA: Insufficient documentation

## 2011-06-21 DIAGNOSIS — F411 Generalized anxiety disorder: Secondary | ICD-10-CM

## 2011-06-21 DIAGNOSIS — I10 Essential (primary) hypertension: Secondary | ICD-10-CM

## 2011-06-21 DIAGNOSIS — F17200 Nicotine dependence, unspecified, uncomplicated: Secondary | ICD-10-CM

## 2011-06-21 DIAGNOSIS — D170 Benign lipomatous neoplasm of skin and subcutaneous tissue of head, face and neck: Secondary | ICD-10-CM | POA: Insufficient documentation

## 2011-06-21 DIAGNOSIS — F419 Anxiety disorder, unspecified: Secondary | ICD-10-CM

## 2011-06-21 NOTE — Assessment & Plan Note (Signed)
Has ~2x2cm mass under left occipital scalp No inflammation or tenderness Could be cyst but probably lipoma No Rx needed

## 2011-06-21 NOTE — Assessment & Plan Note (Signed)
counselled 5 minutes Gave info on 1-800 [employer]

## 2011-06-21 NOTE — Assessment & Plan Note (Signed)
Improved Mild depressed mood at times as well but nothing that is severe Will hold off on meds Rarely uses diazepam

## 2011-06-21 NOTE — Assessment & Plan Note (Signed)
BP Readings from Last 3 Encounters:  06/21/11 142/85  04/07/11 158/80  03/22/11 128/80   Reasonable control No changes

## 2011-06-21 NOTE — Progress Notes (Signed)
Subjective:    Patient ID: Dustin [name], male    DOB: 07-10-1956, 54 y.o.   MRN: [id number]  HPI Prostatitis cleared up completely  Hasn't been on regular meds for his anxiety Has had spells of anxiety but is better Not on the road as much so less stress occ depressed mood but nothing persistent No regular use of valium  Has area on the back of his head Seems to be more itchy  Still concerned about his smoking Feels his mood is better and he could try again Got "ill" ---angry, on bupropion chantix didn't work  No chest pain No SOB  Current Outpatient Prescriptions on File Prior to Visit  Medication Sig Dispense Refill  . amLODipine (NORVASC) 5 MG tablet Take 5 mg by mouth daily.        Marland Kitchen aspirin 81 MG tablet Take 81 mg by mouth once a week.       . diazepam (VALIUM) 2 MG tablet Take by mouth 3 (three) times daily as needed.       . ranitidine (ZANTAC) 75 MG tablet Take 75 mg by mouth daily.        . tadalafil (CIALIS) 20 MG tablet Take 1/2 to 1 tablet about 1 hour before sex  10 tablet  0    Allergies  Allergen Reactions  . Bupropion Hcl     REACTION: Mood swings  . Omeprazole-Sodium Bicarbonate     REACTION: Didn't feel good  . Varenicline Tartrate     Past Medical History  Diagnosis Date  . History of colonic polyps   . Hypertension   . Cerebral aneurysm   . 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 Inguinal hernia repair child    bilateral  . Tonsillectomy   . Right testicle removed 1978  . Abdominal exploration surgery 1978  . Prolactinoma excision 09/04    [name]  . Esophagogastroduodenoscopy 03/08     and colon  . Appendectomy     at same time as lymph node dissection  . Stress nuclear negative    10/09    Family History  Problem Relation Age of Onset  . Cancer Brother     melanoma  . Cancer Paternal Aunt     lung  . Cancer Other     strong in family    History    Social History  . Marital Status: Significant Other    Spouse Name: N/A    Number of Children: 0  . 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 Smoking status: Current Everyday Smoker -- 1.5 packs/day for 40 years  . Smokeless tobacco: Never Used  . Alcohol Use: Yes     occassionally  . Drug Use: No  . Sexually Active: Not on file   Other Topics Concern  . Not on file   Social History Narrative   Cardiology -Dr. Clearnce Hasten, long term monogamous relationship   Review of Systems Sleeps well occ early awakening if he has a lot on his mind    Objective:   Physical Exam  Constitutional: He appears well-developed and well-nourished. No distress.  HENT:       Lump on scalp---left occiput 2x2cm No inflammation  Psychiatric: He has a normal mood and affect. His behavior is normal. Judgment and thought content normal.  Assessment & Plan:

## 2011-08-23 ENCOUNTER — Other Ambulatory Visit: Payer: Self-pay | Admitting: Internal Medicine

## 2011-10-26 ENCOUNTER — Encounter: Payer: Self-pay | Admitting: Internal Medicine

## 2011-10-26 ENCOUNTER — Ambulatory Visit (INDEPENDENT_AMBULATORY_CARE_PROVIDER_SITE_OTHER): Payer: 59 | Admitting: Internal Medicine

## 2011-10-26 VITALS — BP 138/80 | HR 84 | Temp 98.2°F | Ht 68.0 in | Wt 217.0 lb

## 2011-10-26 DIAGNOSIS — Z0000 Encounter for general adult medical examination without abnormal findings: Secondary | ICD-10-CM

## 2011-10-26 DIAGNOSIS — D352 Benign neoplasm of pituitary gland: Secondary | ICD-10-CM

## 2011-10-26 DIAGNOSIS — I10 Essential (primary) hypertension: Secondary | ICD-10-CM

## 2011-10-26 DIAGNOSIS — F419 Anxiety disorder, unspecified: Secondary | ICD-10-CM

## 2011-10-26 DIAGNOSIS — F411 Generalized anxiety disorder: Secondary | ICD-10-CM

## 2011-10-26 DIAGNOSIS — Z0001 Encounter for general adult medical examination with abnormal findings: Secondary | ICD-10-CM | POA: Insufficient documentation

## 2011-10-26 DIAGNOSIS — E785 Hyperlipidemia, unspecified: Secondary | ICD-10-CM

## 2011-10-26 LAB — HEPATIC FUNCTION PANEL
Albumin: 3.8 g/dL (ref 3.5–5.2)
Total Bilirubin: 0.5 mg/dL (ref 0.3–1.2)

## 2011-10-26 LAB — CBC WITH DIFFERENTIAL/PLATELET
Basophils Absolute: 0 10*3/uL (ref 0.0–0.1)
Eosinophils Absolute: 0.3 10*3/uL (ref 0.0–0.7)
Eosinophils Relative: 3.6 % (ref 0.0–5.0)
MCHC: 34.5 g/dL (ref 30.0–36.0)
MCV: 95.2 fl (ref 78.0–100.0)
Monocytes Absolute: 0.5 10*3/uL (ref 0.1–1.0)
Neutrophils Relative %: 65.8 % (ref 43.0–77.0)
Platelets: 185 10*3/uL (ref 150.0–400.0)
RDW: 12.8 % (ref 11.5–14.6)
WBC: 8.9 10*3/uL (ref 4.5–10.5)

## 2011-10-26 LAB — BASIC METABOLIC PANEL
BUN: 17 mg/dL (ref 6–23)
Chloride: 107 mEq/L (ref 96–112)
Creatinine, Ser: 1.2 mg/dL (ref 0.4–1.5)
Glucose, Bld: 89 mg/dL (ref 70–99)

## 2011-10-26 LAB — TSH: TSH: 1.34 u[IU]/mL (ref 0.35–5.50)

## 2011-10-26 LAB — LIPID PANEL
Cholesterol: 233 mg/dL — ABNORMAL HIGH (ref 0–200)
VLDL: 29.4 mg/dL (ref 0.0–40.0)

## 2011-10-26 LAB — LDL CHOLESTEROL, DIRECT: Direct LDL: 153.2 mg/dL

## 2011-10-26 LAB — PROLACTIN: Prolactin: 4.9 ng/mL (ref 2.1–17.1)

## 2011-10-26 MED ORDER — DIAZEPAM 2 MG PO TABS: 2.0000 mg | ORAL_TABLET | Freq: Two times a day (BID) | ORAL | Status: DC | PRN

## 2011-10-26 NOTE — Assessment & Plan Note (Signed)
Will recheck prolactin given nipple sensitivity (though no PE findings) No weakness detected on left side either

## 2011-10-26 NOTE — Assessment & Plan Note (Signed)
Worse with multiple stressors Also with dysthymia Will set up with Dr Laymond Purser

## 2011-10-26 NOTE — Progress Notes (Signed)
Subjective:    Patient ID: Dustin [name], male    DOB: [birth year], 56 y.o.   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 buying a company and then he would have more control  Has noticed right breast sensitivity in right nipple in shower Even without real hot water No swelling No nipple discharge  Left arm and leg are weaker than right Notices when getting up from sitting Trouble carrying water buckets Pain when trying to pick things up Sleeps on left side advil may help the pain some--uses it in the evening prn  Lots of anxiety and depression in the past few months Had some improvement with fluoxetine but completely destroyed sex life No energy Some degree of anhedonia No sig thoughts about death or suicide but self esteem issues (then wonders about his place in the world) If he gets out to do something, he does feel better Not every day but will have a bad 3-7 days-----then improves again Feels he got worse since having to put down 57 year old horse of his mothers (had been in distress, stuck in muddy pond). This may have brought back memories of father's suicide (he shot himself when had inoperable brain cancer) Did see counselor after dad's death (and going through divorce)  Current Outpatient Prescriptions on File Prior to Visit  Medication Sig Dispense Refill  . amLODipine (NORVASC) 5 MG tablet TAKE1 TAB BY MOUTH DAILY FOR HIGH BLOOD PRESSURE  30 tablet  3  . ranitidine (ZANTAC) 75 MG tablet Take 75 mg by mouth daily.        . tadalafil (CIALIS) 20 MG tablet Take 1/2 to 1 tablet about 1 hour before sex  10 tablet  0    Allergies  Allergen Reactions  . Bupropion Hcl     REACTION: Mood swings  . Omeprazole-Sodium Bicarbonate     REACTION: Didn't feel good  . Varenicline Tartrate     Past Medical History  Diagnosis Date  . History of colonic polyps   .  Hypertension   . Cerebral aneurysm   . 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 Inguinal hernia repair child    bilateral  . Tonsillectomy   . Right testicle removed 1978  . Abdominal exploration surgery 1978  . Prolactinoma excision 09/04    [name]  . Esophagogastroduodenoscopy 03/08     and colon  . Appendectomy     at same time as lymph node dissection  . Stress nuclear negative    10/09    Family History  Problem Relation Age of Onset  . Cancer Brother     melanoma  . Cancer Paternal Aunt     lung  . Cancer Other     strong in family    History   Social History  . Marital Status: Significant Other    Spouse Name: N/A    Number of Children: 0  . 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 Smoking status: Current Everyday Smoker -- 1.5 packs/day for 40 years  . Smokeless tobacco: Never Used  . Alcohol Use: Yes     occassionally  . Drug Use: No  . Sexually Active: Not on file   Other Topics Concern  . Not on  file   Social History Narrative   Cardiology -Dr. Clearnce Hasten, long term monogamous relationship   Review of Systems  Constitutional: Positive for fatigue. Negative for unexpected weight change.       Wears seat belt  HENT: Positive for congestion, rhinorrhea, dental problem and tinnitus. Negative for hearing loss.        Doesn't use allergy meds Regular with dentist---some gum issues and cracked molar  Eyes: Negative for visual disturbance.       Some vision changes but no diplopia or unilateral vision loss  Respiratory: Negative for cough, chest tightness and shortness of breath.   Cardiovascular: Negative for chest pain, palpitations and leg swelling.  Gastrointestinal: Negative for nausea, vomiting, abdominal pain, constipation and blood in stool.       Occ reflux---mostly controlled with qAM ranitidine    Genitourinary: Negative for dysuria, urgency and difficulty urinating.       Not totally satisfied with the cialis Got flushing with viagra but it helped  Musculoskeletal: Positive for back pain and arthralgias. Negative for joint swelling.       Various joint pains Occ back sprains  Skin: Negative for rash.       No suspicious areas---does want me to check a couple of areas on vertex  Neurological: Positive for dizziness, weakness, numbness and headaches. Negative for syncope and light-headedness.       Has vertigo if extends neck or gets up too quick Occ headaches  Hematological: Negative for adenopathy. Does not bruise/bleed easily.  Psychiatric/Behavioral: Positive for dysphoric mood. Negative for sleep disturbance. The patient is nervous/anxious.        Objective:   Physical Exam  Constitutional: He is oriented to person, place, and time. He appears well-developed and well-nourished. No distress.  HENT:  Head: Normocephalic and atraumatic.  Right Ear: External ear normal.  Left Ear: External ear normal.  Mouth/Throat: Oropharynx is clear and moist. No oropharyngeal exudate.       TMs normal  Eyes: Conjunctivae and EOM are normal. Pupils are equal, round, and reactive to light.       Fundi benign  Neck: Normal range of motion. Neck supple. No thyromegaly present.  Cardiovascular: Normal rate, regular rhythm, normal heart sounds and intact distal pulses.  Exam reveals no gallop.   No murmur heard. Pulmonary/Chest: Effort normal and breath sounds normal. No respiratory distress. He has no wheezes. He has no rales.  Abdominal: Soft. There is no tenderness.  Musculoskeletal: Normal range of motion. He exhibits no edema and no tenderness.  Lymphadenopathy:    He has no cervical adenopathy.  Neurological: He is alert and oriented to person, place, and time.  Skin: No rash noted.       No scalp lesions Multiple benign nevi  Psychiatric: His behavior is normal. Judgment and  thought content normal.       Mild anxiety          Assessment & Plan:

## 2011-10-26 NOTE — Assessment & Plan Note (Signed)
BP Readings from Last 3 Encounters:  10/26/11 138/80  06/21/11 142/85  04/07/11 158/80   Reasonable control No changes

## 2011-10-26 NOTE — Assessment & Plan Note (Signed)
Will restart niacin and ASA before

## 2011-10-26 NOTE — Assessment & Plan Note (Signed)
UTD on colon  Will check PSA after discussion Discussed fitness

## 2011-11-16 ENCOUNTER — Ambulatory Visit (INDEPENDENT_AMBULATORY_CARE_PROVIDER_SITE_OTHER): Payer: 59 | Admitting: Psychology

## 2011-11-16 DIAGNOSIS — F331 Major depressive disorder, recurrent, moderate: Secondary | ICD-10-CM

## 2011-11-24 ENCOUNTER — Ambulatory Visit (INDEPENDENT_AMBULATORY_CARE_PROVIDER_SITE_OTHER): Payer: 59 | Admitting: Psychology

## 2011-11-24 DIAGNOSIS — F4321 Adjustment disorder with depressed mood: Secondary | ICD-10-CM

## 2011-12-20 ENCOUNTER — Other Ambulatory Visit: Payer: Self-pay | Admitting: Internal Medicine

## 2012-04-16 ENCOUNTER — Other Ambulatory Visit: Payer: Self-pay | Admitting: Internal Medicine

## 2012-04-24 ENCOUNTER — Encounter: Payer: Self-pay | Admitting: Internal Medicine

## 2012-04-24 ENCOUNTER — Ambulatory Visit (INDEPENDENT_AMBULATORY_CARE_PROVIDER_SITE_OTHER): Payer: 59 | Admitting: Internal Medicine

## 2012-04-24 VITALS — BP 148/80 | HR 77 | Temp 97.9°F | Ht 68.0 in | Wt 216.0 lb

## 2012-04-24 DIAGNOSIS — F411 Generalized anxiety disorder: Secondary | ICD-10-CM

## 2012-04-24 DIAGNOSIS — F17210 Nicotine dependence, cigarettes, uncomplicated: Secondary | ICD-10-CM

## 2012-04-24 DIAGNOSIS — F419 Anxiety disorder, unspecified: Secondary | ICD-10-CM

## 2012-04-24 DIAGNOSIS — I10 Essential (primary) hypertension: Secondary | ICD-10-CM

## 2012-04-24 DIAGNOSIS — F17200 Nicotine dependence, unspecified, uncomplicated: Secondary | ICD-10-CM

## 2012-04-24 MED ORDER — BUPROPION HCL ER (SR) 150 MG PO TB12: 150.0000 mg | ORAL_TABLET | Freq: Two times a day (BID) | ORAL | Status: DC

## 2012-04-24 MED ORDER — SILDENAFIL CITRATE 100 MG PO TABS: 100.0000 mg | ORAL_TABLET | Freq: Every day | ORAL | Status: DC | PRN

## 2012-04-24 NOTE — Progress Notes (Signed)
Subjective:    Patient ID: Dustin [name], male    DOB: 11-09-1955, 56 y.o.   MRN: [id number]  HPI Is wondering about chantix again He got "extremely ill" but thinks it may be related to other stressors at the time Business is picking up No debt now Much less stress Tried wellbutrin but only briefly hsan't found nicotine replacement helpful  Went to Dr Laymond Purser  Able to get past issue with putting horse down Done with counseling now  No chest pain No SOB Only does yard work No set exercise Has some hip pain if he walks too much  Occ numbness in right arm when sitting upright---relates to shoulder problems Plans to try chiropractor  Current Outpatient Prescriptions on File Prior to Visit  Medication Sig Dispense Refill  . amLODipine (NORVASC) 5 MG tablet TAKE1 TAB BY MOUTH DAILY FOR HIGH BLOOD PRESSURE  30 tablet  3  . tadalafil (CIALIS) 20 MG tablet Take 1/2 to 1 tablet about 1 hour before sex  10 tablet  0    Allergies  Allergen Reactions  . Bupropion Hcl     REACTION: Mood swings  . Omeprazole-Sodium Bicarbonate     REACTION: Didn't feel good  . Varenicline Tartrate     Past Medical History  Diagnosis Date  . History of colonic polyps   . Hypertension   . Cerebral aneurysm   . 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 Inguinal hernia repair child    bilateral  . Tonsillectomy   . Right testicle removed 1978  . Abdominal exploration surgery 1978  . Prolactinoma excision 09/04    [name]  . Esophagogastroduodenoscopy 03/08     and colon  . Appendectomy     at same time as lymph node dissection  . Stress nuclear negative    10/09    Family History  Problem Relation Age of Onset  . Cancer Brother     melanoma  . Cancer Paternal Aunt     lung  . Cancer Other     strong in family    History   Social History  . Marital Status: Significant Other    Spouse Name: N/A    Number of  Children: 0  . 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 Smoking status: Current Everyday Smoker -- 1.5 packs/day for 40 years  . Smokeless tobacco: Never Used  . Alcohol Use: Yes     occassionally  . Drug Use: No  . Sexually Active: Not on file   Other Topics Concern  . Not on file   Social History Narrative   Cardiology -Dr. Clearnce Hasten, long term monogamous relationship   Review of Systems Still has some nipple sensitivity--no breast mass Sleeps okay Weight is stable     Objective:   Physical Exam  Constitutional: He appears well-developed and well-nourished. No distress.  Neck: Normal range of motion. Neck supple. No thyromegaly present.  Cardiovascular: Normal rate, regular rhythm and normal heart sounds.  Exam reveals no gallop.   No murmur heard. Pulmonary/Chest: Effort normal and breath sounds normal. No respiratory distress. He has no wheezes. He has no rales.  Genitourinary:       No breast mass  Musculoskeletal: He exhibits no edema and no tenderness.  Lymphadenopathy:    He has no cervical adenopathy.  Psychiatric:  He has a normal mood and affect. His behavior is normal.          Assessment & Plan:

## 2012-04-24 NOTE — Assessment & Plan Note (Signed)
Better now Worked through the stressful things

## 2012-04-24 NOTE — Assessment & Plan Note (Signed)
BP Readings from Last 3 Encounters:  04/24/12 148/80  10/26/11 138/80  06/21/11 142/85   Up a little No change for now

## 2012-04-24 NOTE — Assessment & Plan Note (Signed)
Discussed options Will try bupropion  Use nicotine lozenges if needed

## 2012-04-24 NOTE — Patient Instructions (Signed)
Start bupropion 150mg  daily for 3 days. If no side effects, go up to twice a day after 3 days. Your quit date should be about 10 days after stopping Please use the nicotine patch 21mg  daily at your quit date. Use this for at least 1-2 months also

## 2012-05-26 ENCOUNTER — Ambulatory Visit (INDEPENDENT_AMBULATORY_CARE_PROVIDER_SITE_OTHER): Payer: 59 | Admitting: Internal Medicine

## 2012-05-26 ENCOUNTER — Encounter: Payer: Self-pay | Admitting: Internal Medicine

## 2012-05-26 ENCOUNTER — Telehealth: Payer: Self-pay | Admitting: Internal Medicine

## 2012-05-26 VITALS — BP 148/82 | HR 87 | Temp 97.7°F | Wt 214.0 lb

## 2012-05-26 DIAGNOSIS — J209 Acute bronchitis, unspecified: Secondary | ICD-10-CM

## 2012-05-26 MED ORDER — AMOXICILLIN 500 MG PO TABS: 1000.0000 mg | ORAL_TABLET | Freq: Two times a day (BID) | ORAL | Status: AC

## 2012-05-26 NOTE — Telephone Encounter (Signed)
Caller: Camron/Patient; Patient Name: Dustin [name]; PCP: Tillman Abide; Best Callback Phone Number: [phone]. Started not feeling well onset 05/25/12. Took 2 Advil  and felt better. This morning woke up and with drainage in throat, and chest tender at sternum. Didn't sleep will last night. Feeling slightly short of breath and occasional dry cough worse with laying down. Afebrile. Coughing up brownish sputum. Hx Bronchitis and Pneumonia. Triage and Care Advice per Cough Protocol and Appointment scheduled @ 1530- 05/26/12.

## 2012-05-26 NOTE — Assessment & Plan Note (Signed)
Clearly seems to have viral infection Discussed supportive care Will give Rx for amoxil if seems to have secondary bacterial infection---discussed this

## 2012-05-26 NOTE — Progress Notes (Signed)
  Subjective:    Patient ID: Dustin [name], male    DOB: 01-24-1956, 56 y.o.   MRN: [id number]  HPI Very hoarse since yesterday---coming down with something No fever Feels "rotten" No major congestion in head Has soreness in sternum Not much cough No sig SOB No ear pain Worried abut bronchitis  Current Outpatient Prescriptions on File Prior to Visit  Medication Sig Dispense Refill  . amLODipine (NORVASC) 5 MG tablet TAKE1 TAB BY MOUTH DAILY FOR HIGH BLOOD PRESSURE  30 tablet  3  . buPROPion (WELLBUTRIN SR) 150 MG 12 hr tablet Take 1 tablet (150 mg total) by mouth 2 (two) times daily.  60 tablet  5  . DISCONTD: sildenafil (VIAGRA) 100 MG tablet Take 1 tablet (100 mg total) by mouth daily as needed for erectile dysfunction.  6 tablet  11    Allergies  Allergen Reactions  . Bupropion Hcl     REACTION: Mood swings  . Omeprazole-Sodium Bicarbonate     REACTION: Didn't feel good  . Varenicline Tartrate     Past Medical History  Diagnosis Date  . History of colonic polyps   . Hypertension   . Cerebral aneurysm   . 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 Inguinal hernia repair child    bilateral  . Tonsillectomy   . Right testicle removed 1978  . Abdominal exploration surgery 1978  . Prolactinoma excision 09/04    [name]  . Esophagogastroduodenoscopy 03/08     and colon  . Appendectomy     at same time as lymph node dissection  . Stress nuclear negative    10/09    Family History  Problem Relation Age of Onset  . Cancer Brother     melanoma  . Cancer Paternal Aunt     lung  . Cancer Other     strong in family    History   Social History  . Marital Status: Significant Other    Spouse Name: N/A    Number of Children: 0  . 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 Smoking status: Former Smoker -- 1.5 packs/day for 40  years    Quit date: 04/25/2012  . Smokeless tobacco: Never Used  . Alcohol Use: Yes     occassionally  . Drug Use: No  . Sexually Active: Not on file   Other Topics Concern  . Not on file   Social History Narrative   Cardiology -Dr. Clearnce Hasten, long term monogamous relationship   Review of Systems Will be travelling over next couple of weeks Bad sleep last night No nausea or vomiting    Objective:   Physical Exam  Constitutional: He appears well-developed and well-nourished. No distress.  HENT:  Mouth/Throat: Oropharynx is clear and moist. No oropharyngeal exudate.       No sinus tenderness Mild nasal congestion TMs normal  Neck: Normal range of motion. Neck supple. No thyromegaly present.  Pulmonary/Chest: Effort normal and breath sounds normal. No respiratory distress. He has no wheezes. He has no rales.  Lymphadenopathy:    He has no cervical adenopathy.          Assessment & Plan:

## 2012-08-19 ENCOUNTER — Other Ambulatory Visit: Payer: Self-pay | Admitting: Internal Medicine

## 2012-08-25 ENCOUNTER — Emergency Department: Payer: Self-pay | Admitting: Emergency Medicine

## 2012-08-25 LAB — BASIC METABOLIC PANEL
Chloride: 108 mmol/L — ABNORMAL HIGH (ref 98–107)
Co2: 24 mmol/L (ref 21–32)
Creatinine: 1.28 mg/dL (ref 0.60–1.30)
EGFR (African American): >60
Glucose: 91 mg/dL (ref 65–99)
Osmolality: 280 (ref 275–301)
Potassium: 4.1 mmol/L (ref 3.5–5.1)
Sodium: 140 mmol/L (ref 136–145)

## 2012-08-25 LAB — CBC
HCT: 44.4 % (ref 40.0–52.0)
HGB: 15.7 g/dL (ref 13.0–18.0)
RBC: 4.78 10*6/uL (ref 4.40–5.90)
RDW: 12.7 % (ref 11.5–14.5)
WBC: 7.1 10*3/uL (ref 3.8–10.6)

## 2012-08-25 LAB — CK TOTAL AND CKMB (NOT AT ARMC): CK-MB: 2.4 ng/mL (ref 0.5–3.6)

## 2012-08-25 LAB — TROPONIN I: Troponin-I: <0.02 ng/mL

## 2012-10-23 ENCOUNTER — Telehealth: Payer: Self-pay | Admitting: Internal Medicine

## 2012-10-23 NOTE — Telephone Encounter (Signed)
He quit smoking and is ready to try off the bupropion He feels it may have helped his mood and his girlfriend really feels that way Concerned about ED he feels may be coming from this  Only ever took it once a day Advised him to decrease to 6 days a week and then drop another day each week till he is down to 2-3 per week. At that point he can just stop.  If he worsens with mood, he should just restart Has PE in [place]

## 2012-10-23 NOTE — Telephone Encounter (Signed)
Patient would like to discuss coming off of the medication you put him on to help him to stop smoking.

## 2012-11-18 ENCOUNTER — Other Ambulatory Visit: Payer: Self-pay | Admitting: Internal Medicine

## 2012-11-27 ENCOUNTER — Ambulatory Visit (INDEPENDENT_AMBULATORY_CARE_PROVIDER_SITE_OTHER): Payer: 59 | Admitting: Internal Medicine

## 2012-11-27 ENCOUNTER — Encounter: Payer: Self-pay | Admitting: Internal Medicine

## 2012-11-27 VITALS — BP 140/80 | HR 84 | Temp 98.4°F | Ht 69.0 in | Wt 231.0 lb

## 2012-11-27 DIAGNOSIS — Z0000 Encounter for general adult medical examination without abnormal findings: Secondary | ICD-10-CM

## 2012-11-27 DIAGNOSIS — F419 Anxiety disorder, unspecified: Secondary | ICD-10-CM

## 2012-11-27 DIAGNOSIS — E785 Hyperlipidemia, unspecified: Secondary | ICD-10-CM

## 2012-11-27 DIAGNOSIS — F411 Generalized anxiety disorder: Secondary | ICD-10-CM

## 2012-11-27 DIAGNOSIS — I10 Essential (primary) hypertension: Secondary | ICD-10-CM

## 2012-11-27 LAB — LIPID PANEL
HDL: 39.9 mg/dL (ref >39.00)
VLDL: 54.2 mg/dL — ABNORMAL HIGH (ref 0.0–40.0)

## 2012-11-27 LAB — BASIC METABOLIC PANEL
GFR: 55.82 mL/min — ABNORMAL LOW (ref >60.00)
Potassium: 4.2 mEq/L (ref 3.5–5.1)
Sodium: 137 mEq/L (ref 135–145)

## 2012-11-27 LAB — CBC WITH DIFFERENTIAL/PLATELET
Basophils Relative: 0.3 % (ref 0.0–3.0)
Eosinophils Relative: 3.3 % (ref 0.0–5.0)
HCT: 47.7 % (ref 39.0–52.0)
Hemoglobin: 16 g/dL (ref 13.0–17.0)
Lymphs Abs: 2.3 10*3/uL (ref 0.7–4.0)
Monocytes Relative: 5.5 % (ref 3.0–12.0)
Neutro Abs: 5.9 10*3/uL (ref 1.4–7.7)
WBC: 9 10*3/uL (ref 4.5–10.5)

## 2012-11-27 LAB — HEPATIC FUNCTION PANEL
AST: 18 U/L (ref 0–37)
Albumin: 4.1 g/dL (ref 3.5–5.2)
Alkaline Phosphatase: 71 U/L (ref 39–117)
Total Protein: 6.9 g/dL (ref 6.0–8.3)

## 2012-11-27 NOTE — Assessment & Plan Note (Signed)
Healthy but has gained weight Did stop smoking Discussed fitness efforts PSA every other year UTD on colon

## 2012-11-27 NOTE — Assessment & Plan Note (Signed)
Better Discussed the bupropion I think it may be best to continue daily for now

## 2012-11-27 NOTE — Assessment & Plan Note (Signed)
Lab Results  Component Value Date   LDLCALC 139* 09/22/2009   He is not excited about cholesterol meds Will recheck

## 2012-11-27 NOTE — Assessment & Plan Note (Signed)
BP Readings from Last 3 Encounters:  11/27/12 140/80  05/26/12 148/82  04/24/12 148/80   Good control Due for labs

## 2012-11-27 NOTE — Patient Instructions (Signed)
Please try "MyFitness Pal" on your smart phone to help with proper eating  DASH Diet The DASH diet stands for "Dietary Approaches to Stop Hypertension." It is a healthy eating plan that has been shown to reduce high blood pressure (hypertension) in as little as 14 days, while also possibly providing other significant health benefits. These other health benefits include reducing the risk of breast cancer after menopause and reducing the risk of type 2 diabetes, heart disease, colon cancer, and stroke. Health benefits also include weight loss and slowing kidney failure in patients with chronic kidney disease.  DIET GUIDELINES  Limit salt (sodium). Your diet should contain less than 1500 mg of sodium daily.  Limit refined or processed carbohydrates. Your diet should include mostly whole grains. Desserts and added sugars should be used sparingly.  Include small amounts of heart-healthy fats. These types of fats include nuts, oils, and tub margarine. Limit saturated and trans fats. These fats have been shown to be harmful in the body. CHOOSING FOODS  The following food groups are based on a 2000 calorie diet. See your Registered Dietitian for individual calorie needs. Grains and Grain Products (6 to 8 servings daily)  Eat More Often: Whole-wheat bread, brown rice, whole-grain or wheat pasta, quinoa, popcorn without added fat or salt (air popped).  Eat Less Often: White bread, white pasta, white rice, cornbread. Vegetables (4 to 5 servings daily)  Eat More Often: Fresh, frozen, and canned vegetables. Vegetables may be raw, steamed, roasted, or grilled with a minimal amount of fat.  Eat Less Often/Avoid: Creamed or fried vegetables. Vegetables in a cheese sauce. Fruit (4 to 5 servings daily)  Eat More Often: All fresh, canned (in natural juice), or frozen fruits. Dried fruits without added sugar. One hundred percent fruit juice ( cup [237 mL] daily).  Eat Less Often: Dried fruits with added  sugar. Canned fruit in light or heavy syrup. [employer], Fish, and Poultry (2 servings or less daily. One serving is 3 to 4 oz [85-114 g]).  Eat More Often: Ninety percent or leaner ground beef, tenderloin, sirloin. Round cuts of beef, chicken breast, [place] breast. All fish. Grill, bake, or broil your meat. Nothing should be fried.  Eat Less Often/Avoid: Fatty cuts of meat, [place], or chicken leg, thigh, or wing. Fried cuts of meat or fish. Dairy (2 to 3 servings)  Eat More Often: Low-fat or fat-free milk, low-fat plain or light yogurt, reduced-fat or part-skim cheese.  Eat Less Often/Avoid: Milk (whole, 2%).Whole milk yogurt. Full-fat cheeses. Nuts, Seeds, and Legumes (4 to 5 servings per week)  Eat More Often: All without added salt.  Eat Less Often/Avoid: Salted nuts and seeds, canned beans with added salt. Fats and Sweets (limited)  Eat More Often: Vegetable oils, tub margarines without trans fats, sugar-free gelatin. Mayonnaise and salad dressings.  Eat Less Often/Avoid: Coconut oils, palm oils, butter, stick margarine, cream, half and half, cookies, candy, pie. FOR MORE INFORMATION The Dash Diet Eating Plan: www.dashdiet.org Document Released: 09/16/2011 Document Revised: 12/20/2011 Document Reviewed: 09/16/2011 [facility] Patient Information 2013 [place], [place].

## 2012-11-27 NOTE — Progress Notes (Signed)
Subjective:    Patient ID: Dustin [name], male    DOB: 28-May-1956, 57 y.o.   MRN: [id number]  HPI Did stop smoking--none since July Weight up 17# Starting with personal trainer--eating more since stopping smoking  Still on the bupropion Considering weaning off but mood may be more level on it Some energy problems--no exercise of late  Business seems to be better  Pulled something or tore something in left chest---trying to get up out of bed 3 ibuprofen did help  Has place on bottom of right foot he wants checked  Current Outpatient Prescriptions on File Prior to Visit  Medication Sig Dispense Refill  . amLODipine (NORVASC) 5 MG tablet TAKE1 TAB BY MOUTH DAILY FOR HIGH BLOOD PRESSURE  30 tablet  11  . buPROPion (WELLBUTRIN SR) 150 MG 12 hr tablet Take 150 mg by mouth daily.      . sildenafil (VIAGRA) 100 MG tablet Take 100 mg by mouth daily as needed.       No current facility-administered medications on file prior to visit.    Allergies  Allergen Reactions  . Omeprazole-Sodium Bicarbonate     REACTION: Didn't feel good  . Varenicline Tartrate     Past Medical History  Diagnosis Date  . History of colonic polyps   . Hypertension   . Cerebral aneurysm   . 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 Inguinal hernia repair  child    bilateral  . Tonsillectomy    . Right testicle removed  1978  . Abdominal exploration surgery  1978  . Prolactinoma excision  09/04    [name]  . Esophagogastroduodenoscopy  03/08     and colon  . Appendectomy      at same time as lymph node dissection  . Stress nuclear  negative    10/09    Family History  Problem Relation Age of Onset  . Cancer Brother     melanoma  . Cancer Paternal Aunt     lung  . Cancer Other     strong in family    History   Social History  . Marital Status: Significant Other    Spouse Name: N/A    Number of  Children: 0  . 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 Smoking status: Former Smoker -- 1.50 packs/day for 40 years    Quit date: 04/25/2012  . Smokeless tobacco: Never Used  . Alcohol Use: Yes     Comment: occassionally  . Drug Use: No  . Sexually Active: Not on file   Other Topics Concern  . Not on file   Social History Narrative   Cardiology -Dr. Juanito Doom   Divorced, long term monogamous relationship         Review of Systems  Constitutional: Positive for appetite change, fatigue and unexpected weight change.       Wears seat belt  HENT: Positive for hearing loss, dental problem and tinnitus. Negative for congestion and rhinorrhea.        Sees Erline Hau for follow up--uses earplugs when shooting Some bleeding gums--regular with dentist  Eyes: Negative for visual disturbance.       No diplopia or unilateral vision loss   Respiratory: Positive for cough. Negative for chest tightness and shortness of breath.   Cardiovascular: Positive for palpitations.  Negative for chest pain and leg swelling.       Heart goes fast with exertion  Gastrointestinal: Positive for abdominal pain. Negative for nausea, vomiting, constipation and blood in stool.       Under umbilicus--?hernia Occ heartburn---uses TUMS only at night Stopped acid reducer No dysphagia  Endocrine: Negative for cold intolerance and heat intolerance.  Genitourinary: Positive for difficulty urinating. Negative for urgency and frequency.       Nocturia x 1 Some dribbling Libido is down  Skin: Negative for pallor and rash.       Had skin cancer surgery  Allergic/Immunologic: Negative for environmental allergies.  Neurological: Positive for dizziness and numbness. Negative for syncope, weakness, light-headedness and headaches.       Still has vertigo if he looks up Some numbness in hands if holds in stable position for a long time  Hematological:  Negative for adenopathy. Does not bruise/bleed easily.  Psychiatric/Behavioral: Negative for sleep disturbance and dysphoric mood. The patient is not nervous/anxious.        Anxiety mostly better Rarely uses partner's xanax       Objective:   Physical Exam  Constitutional: He is oriented to person, place, and time. He appears well-developed and well-nourished. No distress.  HENT:  Head: Normocephalic and atraumatic.  Right Ear: External ear normal.  Left Ear: External ear normal.  Mouth/Throat: Oropharynx is clear and moist. No oropharyngeal exudate.  Eyes: Conjunctivae and EOM are normal. Pupils are equal, round, and reactive to light.  Neck: Normal range of motion. Neck supple. No thyromegaly present.  Cardiovascular: Normal rate, regular rhythm, normal heart sounds and intact distal pulses.  Exam reveals no gallop.   No murmur heard. Pulmonary/Chest: Effort normal and breath sounds normal. No respiratory distress. He has no wheezes. He has no rales.  Abdominal: Soft. There is no tenderness.  Musculoskeletal: He exhibits no edema and no tenderness.  Nodule along right plantar fascia  Lymphadenopathy:    He has no cervical adenopathy.  Neurological: He is alert and oriented to person, place, and time.  Skin: No rash noted. No erythema.  Psychiatric: He has a normal mood and affect. His behavior is normal.          Assessment & Plan:

## 2013-02-20 ENCOUNTER — Telehealth: Payer: Self-pay | Admitting: Internal Medicine

## 2013-02-20 NOTE — Telephone Encounter (Signed)
Spoke with insurance agent and advised results

## 2013-02-20 NOTE — Telephone Encounter (Signed)
Caller: Shirley/; Phone: [phone]; Reason for Call: Insurance company calling concerning patients current medical status.  He is trying to get inusrance.  She has the patient medical records and HIPPA form.  She needs to speak to a nurse regarding patient diagnosis.  His PMH a "Cerebral Bleed".  No date, no other mention of the incident.  Patient denies this history.  SHE NEEDS TO SPEAK TO A NURSE WHO CAN CLARIFY THIS MEDICAL HISTORY.  PLEASE CONTACT [phone]

## 2013-02-20 NOTE — Telephone Encounter (Signed)
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 The radiologist noted "a suspicion of 2.28mm aneurysm at MCA bifurcation" but it wasn't clear cut He has never had a cerebral bleed

## 2013-03-07 ENCOUNTER — Encounter: Payer: Self-pay | Admitting: Internal Medicine

## 2013-03-07 DIAGNOSIS — Z0279 Encounter for issue of other medical certificate: Secondary | ICD-10-CM

## 2013-05-28 ENCOUNTER — Ambulatory Visit (INDEPENDENT_AMBULATORY_CARE_PROVIDER_SITE_OTHER): Payer: BC Managed Care – PPO | Admitting: Internal Medicine

## 2013-05-28 ENCOUNTER — Encounter: Payer: Self-pay | Admitting: Internal Medicine

## 2013-05-28 VITALS — BP 148/90 | HR 76 | Temp 97.9°F | Wt 230.0 lb

## 2013-05-28 DIAGNOSIS — F419 Anxiety disorder, unspecified: Secondary | ICD-10-CM

## 2013-05-28 DIAGNOSIS — F411 Generalized anxiety disorder: Secondary | ICD-10-CM

## 2013-05-28 DIAGNOSIS — I10 Essential (primary) hypertension: Secondary | ICD-10-CM

## 2013-05-28 DIAGNOSIS — R42 Dizziness and giddiness: Secondary | ICD-10-CM

## 2013-05-28 NOTE — Progress Notes (Signed)
Subjective:    Patient ID: Dustin [name], male    DOB: 1956/02/16, 57 y.o.   MRN: [id number]  HPI Has had increased vertigo over the past month Has gotten repositioning from Dr Jenne Campus Has meclizine but hasn't used much  Hasn't smoked in about 1 year Weight still up despite regular exercise Did go off the bupropion by 5 months ago No problems with anxiety  Has some umbilical pain at times Wonders about a hernia  Has had some upper chest pain across both sides Feels muscular (like after working in yard) No SOB  Current Outpatient Prescriptions on File Prior to Visit  Medication Sig Dispense Refill  . amLODipine (NORVASC) 5 MG tablet TAKE1 TAB BY MOUTH DAILY FOR HIGH BLOOD PRESSURE  30 tablet  11  . sildenafil (VIAGRA) 100 MG tablet Take 100 mg by mouth daily as needed.       No current facility-administered medications on file prior to visit.    Allergies  Allergen Reactions  . Omeprazole-Sodium Bicarbonate     REACTION: Didn't feel good  . Varenicline Tartrate     Past Medical History  Diagnosis Date  . History of colonic polyps   . Hypertension   . Cerebral aneurysm   . 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 Inguinal hernia repair  child    bilateral  . Tonsillectomy    . Right testicle removed  1978  . Abdominal exploration surgery  1978  . Prolactinoma excision  09/04    [name]  . Esophagogastroduodenoscopy  03/08     and colon  . Appendectomy      at same time as lymph node dissection  . Stress nuclear  negative    10/09  . Mohs surgery Left nose    at [facility]--- 2013    Family History  Problem Relation Age of Onset  . Cancer Brother     melanoma  . Cancer Paternal Aunt     lung  . Cancer Other     strong in family    History   Social History  . Marital Status: Significant Other    Spouse Name: N/A    Number of Children: 0  . Years of Education:  N/A   Occupational History  . 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 Smoking status: Former Smoker -- 1.50 packs/day for 40 years    Quit date: 04/25/2012  . Smokeless tobacco: Never Used  . Alcohol Use: Yes     Comment: occassionally  . Drug Use: No  . Sexual Activity: Not on file   Other Topics Concern  . Not on file   Social History Narrative   Cardiology -Dr. Juanito Doom   Divorced, long term monogamous relationship         Review of Systems Occasional knee and hip pain if he pushes things    Objective:   Physical Exam  Constitutional: He appears well-developed and well-nourished. No distress.  Neck: Normal range of motion. Neck supple. No thyromegaly present.  Cardiovascular: Normal rate, regular rhythm and normal heart sounds.  Exam reveals no gallop.   No murmur heard. Pulmonary/Chest: Effort normal and breath sounds normal. No respiratory distress. He has no wheezes. He has no rales.  Abdominal: Soft. There is no tenderness.  No umbilical hernia  Musculoskeletal:  Thickened nails without true infection. No overt ingrowing  Flexor  cyst on plantar right foot by arch  Lymphadenopathy:    He has no cervical adenopathy.  Neurological:  Mild vertigo getting up from supine          Assessment & Plan:

## 2013-05-28 NOTE — Assessment & Plan Note (Signed)
Ongoing problems  Will use the meclizine

## 2013-05-28 NOTE — Assessment & Plan Note (Signed)
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

## 2013-05-28 NOTE — Assessment & Plan Note (Signed)
BP Readings from Last 3 Encounters:  05/28/13 148/90  11/27/12 140/80  05/26/12 148/82   Was 126/80 3 days ago Will just work on fitness

## 2013-08-12 ENCOUNTER — Other Ambulatory Visit: Payer: Self-pay | Admitting: Internal Medicine

## 2013-08-16 ENCOUNTER — Other Ambulatory Visit: Payer: Self-pay

## 2013-11-28 ENCOUNTER — Encounter: Payer: BC Managed Care – PPO | Admitting: Internal Medicine

## 2013-11-28 ENCOUNTER — Ambulatory Visit: Payer: BC Managed Care – PPO | Admitting: Internal Medicine

## 2013-11-28 ENCOUNTER — Encounter: Payer: Self-pay | Admitting: Internal Medicine

## 2013-11-28 ENCOUNTER — Ambulatory Visit (INDEPENDENT_AMBULATORY_CARE_PROVIDER_SITE_OTHER): Payer: BC Managed Care – PPO | Admitting: Internal Medicine

## 2013-11-28 VITALS — BP 140/80 | HR 79 | Temp 98.1°F | Ht 69.0 in | Wt 241.0 lb

## 2013-11-28 DIAGNOSIS — I671 Cerebral aneurysm, nonruptured: Secondary | ICD-10-CM

## 2013-11-28 DIAGNOSIS — K219 Gastro-esophageal reflux disease without esophagitis: Secondary | ICD-10-CM

## 2013-11-28 DIAGNOSIS — I10 Essential (primary) hypertension: Secondary | ICD-10-CM

## 2013-11-28 DIAGNOSIS — Z125 Encounter for screening for malignant neoplasm of prostate: Secondary | ICD-10-CM

## 2013-11-28 DIAGNOSIS — R209 Unspecified disturbances of skin sensation: Secondary | ICD-10-CM

## 2013-11-28 DIAGNOSIS — Z0000 Encounter for general adult medical examination without abnormal findings: Secondary | ICD-10-CM

## 2013-11-28 DIAGNOSIS — R42 Dizziness and giddiness: Secondary | ICD-10-CM

## 2013-11-28 DIAGNOSIS — R200 Anesthesia of skin: Secondary | ICD-10-CM

## 2013-11-28 DIAGNOSIS — R0600 Dyspnea, unspecified: Secondary | ICD-10-CM

## 2013-11-28 DIAGNOSIS — R0989 Other specified symptoms and signs involving the circulatory and respiratory systems: Secondary | ICD-10-CM

## 2013-11-28 DIAGNOSIS — R0609 Other forms of dyspnea: Secondary | ICD-10-CM

## 2013-11-28 LAB — CBC WITH DIFFERENTIAL/PLATELET
Basophils Absolute: 0 10*3/uL (ref 0.0–0.1)
Basophils Relative: 0.4 % (ref 0.0–3.0)
EOS ABS: 0.2 10*3/uL (ref 0.0–0.7)
Eosinophils Relative: 3.1 % (ref 0.0–5.0)
HCT: 46.1 % (ref 39.0–52.0)
Hemoglobin: 15.1 g/dL (ref 13.0–17.0)
LYMPHS ABS: 2 10*3/uL (ref 0.7–4.0)
Lymphocytes Relative: 29.3 % (ref 12.0–46.0)
MCHC: 32.8 g/dL (ref 30.0–36.0)
MCV: 93.7 fl (ref 78.0–100.0)
MONO ABS: 0.4 10*3/uL (ref 0.1–1.0)
Monocytes Relative: 5.3 % (ref 3.0–12.0)
NEUTROS PCT: 61.9 % (ref 43.0–77.0)
Neutro Abs: 4.2 10*3/uL (ref 1.4–7.7)
PLATELETS: 181 10*3/uL (ref 150.0–400.0)
RBC: 4.92 Mil/uL (ref 4.22–5.81)
RDW: 13 % (ref 11.5–14.6)
WBC: 6.8 10*3/uL (ref 4.5–10.5)

## 2013-11-28 LAB — LIPID PANEL
CHOL/HDL RATIO: 6
Cholesterol: 239 mg/dL — ABNORMAL HIGH (ref 0–200)
HDL: 40.5 mg/dL (ref >39.00)
Triglycerides: 253 mg/dL — ABNORMAL HIGH (ref 0.0–149.0)
VLDL: 50.6 mg/dL — ABNORMAL HIGH (ref 0.0–40.0)

## 2013-11-28 LAB — COMPREHENSIVE METABOLIC PANEL
ALBUMIN: 3.8 g/dL (ref 3.5–5.2)
ALK PHOS: 62 U/L (ref 39–117)
ALT: 31 U/L (ref 0–53)
AST: 22 U/L (ref 0–37)
BUN: 15 mg/dL (ref 6–23)
CO2: 26 mEq/L (ref 19–32)
Calcium: 9 mg/dL (ref 8.4–10.5)
Chloride: 104 mEq/L (ref 96–112)
Creatinine, Ser: 1.1 mg/dL (ref 0.4–1.5)
GFR: 72.87 mL/min (ref >60.00)
Glucose, Bld: 78 mg/dL (ref 70–99)
POTASSIUM: 4.2 meq/L (ref 3.5–5.1)
SODIUM: 136 meq/L (ref 135–145)
TOTAL PROTEIN: 6.2 g/dL (ref 6.0–8.3)
Total Bilirubin: 0.7 mg/dL (ref 0.3–1.2)

## 2013-11-28 LAB — VITAMIN B12: Vitamin B-12: 227 pg/mL (ref 211–911)

## 2013-11-28 LAB — TSH: TSH: 2.08 u[IU]/mL (ref 0.35–5.50)

## 2013-11-28 LAB — PSA: PSA: 1.04 ng/mL (ref 0.10–4.00)

## 2013-11-28 LAB — T4, FREE: FREE T4: 0.65 ng/dL (ref 0.60–1.60)

## 2013-11-28 LAB — LDL CHOLESTEROL, DIRECT: Direct LDL: 152.8 mg/dL

## 2013-11-28 MED ORDER — HYDROCHLOROTHIAZIDE 12.5 MG PO CAPS: 12.5000 mg | ORAL_CAPSULE | Freq: Every day | ORAL | Status: DC

## 2013-11-28 NOTE — Patient Instructions (Signed)
Exercise to Lose Weight Exercise and a healthy diet may help you lose weight. Your doctor may suggest specific exercises. EXERCISE IDEAS AND TIPS  Choose low-cost things you enjoy doing, such as walking, bicycling, or exercising to workout videos.  Take stairs instead of the elevator.  Walk during your lunch break.  Park your car further away from work or school.  Go to a gym or an exercise class.  Start with 5 to 10 minutes of exercise each day. Build up to 30 minutes of exercise 4 to 6 days a week.  Wear shoes with good support and comfortable clothes.  Stretch before and after working out.  Work out until you breathe harder and your heart beats faster.  Drink extra water when you exercise.  Do not do so much that you hurt yourself, feel dizzy, or get very short of breath. Exercises that burn about 150 calories:  Running 1  miles in 15 minutes.  Playing volleyball for 45 to 60 minutes.  Washing and waxing a car for 45 to 60 minutes.  Playing touch football for 45 minutes.  Walking 1  miles in 35 minutes.  Pushing a stroller 1  miles in 30 minutes.  Playing basketball for 30 minutes.  Raking leaves for 30 minutes.  Bicycling 5 miles in 30 minutes.  Walking 2 miles in 30 minutes.  Dancing for 30 minutes.  Shoveling snow for 15 minutes.  Swimming laps for 20 minutes.  Walking up stairs for 15 minutes.  Bicycling 4 miles in 15 minutes.  Gardening for 30 to 45 minutes.  Jumping rope for 15 minutes.  Washing windows or floors for 45 to 60 minutes. Document Released: 10/30/2010 Document Revised: 12/20/2011 Document Reviewed: 10/30/2010 [facility] Patient Information 2014 [place], [place]. DASH Diet The DASH diet stands for "Dietary Approaches to Stop Hypertension." It is a healthy eating plan that has been shown to reduce high blood pressure (hypertension) in as little as 14 days, while also possibly providing other significant health benefits. These other  health benefits include reducing the risk of breast cancer after menopause and reducing the risk of type 2 diabetes, heart disease, colon cancer, and stroke. Health benefits also include weight loss and slowing kidney failure in patients with chronic kidney disease.  DIET GUIDELINES  Limit salt (sodium). Your diet should contain less than 1500 mg of sodium daily.  Limit refined or processed carbohydrates. Your diet should include mostly whole grains. Desserts and added sugars should be used sparingly.  Include small amounts of heart-healthy fats. These types of fats include nuts, oils, and tub margarine. Limit saturated and trans fats. These fats have been shown to be harmful in the body. CHOOSING FOODS  The following food groups are based on a 2000 calorie diet. See your Registered Dietitian for individual calorie needs. Grains and Grain Products (6 to 8 servings daily)  Eat More Often: Whole-wheat bread, brown rice, whole-grain or wheat pasta, quinoa, popcorn without added fat or salt (air popped).  Eat Less Often: White bread, white pasta, white rice, cornbread. Vegetables (4 to 5 servings daily)  Eat More Often: Fresh, frozen, and canned vegetables. Vegetables may be raw, steamed, roasted, or grilled with a minimal amount of fat.  Eat Less Often/Avoid: Creamed or fried vegetables. Vegetables in a cheese sauce. Fruit (4 to 5 servings daily)  Eat More Often: All fresh, canned (in natural juice), or frozen fruits. Dried fruits without added sugar. One hundred percent fruit juice ( cup [237 mL] daily).  Eat Less Often: Dried fruits with added sugar. Canned fruit in light or heavy syrup. [employer], Fish, and Poultry (2 servings or less daily. One serving is 3 to 4 oz [85-114 g]).  Eat More Often: Ninety percent or leaner ground beef, tenderloin, sirloin. Round cuts of beef, chicken breast, [place] breast. All fish. Grill, bake, or broil your meat. Nothing should be fried.  Eat Less  Often/Avoid: Fatty cuts of meat, [place], or chicken leg, thigh, or wing. Fried cuts of meat or fish. Dairy (2 to 3 servings)  Eat More Often: Low-fat or fat-free milk, low-fat plain or light yogurt, reduced-fat or part-skim cheese.  Eat Less Often/Avoid: Milk (whole, 2%).Whole milk yogurt. Full-fat cheeses. Nuts, Seeds, and Legumes (4 to 5 servings per week)  Eat More Often: All without added salt.  Eat Less Often/Avoid: Salted nuts and seeds, canned beans with added salt. Fats and Sweets (limited)  Eat More Often: Vegetable oils, tub margarines without trans fats, sugar-free gelatin. Mayonnaise and salad dressings.  Eat Less Often/Avoid: Coconut oils, palm oils, butter, stick margarine, cream, half and half, cookies, candy, pie. FOR MORE INFORMATION The Dash Diet Eating Plan: www.dashdiet.org Document Released: 09/16/2011 Document Revised: 12/20/2011 Document Reviewed: 09/16/2011 [facility] Patient Information 2014 [place], [place].

## 2013-11-28 NOTE — Assessment & Plan Note (Signed)
BP Readings from Last 3 Encounters:  11/28/13 140/80  05/28/13 148/90  11/27/12 140/80   Good control Will try low dose HCTZ due to the vertigo persistently

## 2013-11-28 NOTE — Assessment & Plan Note (Signed)
Asked him to try famotidine 20 or 40mg  bid for now Will discuss again at follow up

## 2013-11-28 NOTE — Progress Notes (Signed)
Subjective:    Patient ID: Dustin [name], male    DOB: [birth year], 58 y.o.   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 Not findings  Had SOB when in [place] pain in legs quickly Only at 4700 feet No headaches Some SOB has persisted here--- even like just bringing out the trash Had been exercising--but stopped in December Not sick--no fever or cough  Constant heartburn and indigestion Stopped the ranitidine some months ago--- may have caused loose stools Just tums now--helps briefly (uses 4 per day) No trouble swallowing All PPIs have caused rectal leakage and trouble staying clean (even off the meds)--relates to a persistent hemorrhoid also Usually moves bowels 2-3 times every morning. Will have blood on wipes at times  Current Outpatient Prescriptions on File Prior to Visit  Medication Sig Dispense Refill  . amLODipine (NORVASC) 5 MG tablet TAKE 1 TABLET BY MOUTH EVERY DAY FOR HIGH BLOOD PRESSURE  30 tablet  11  . sildenafil (VIAGRA) 100 MG tablet Take 100 mg by mouth daily as needed.       No current facility-administered medications on file prior to visit.    Allergies  Allergen Reactions  . Omeprazole-Sodium Bicarbonate     REACTION: Didn't feel good  . Varenicline Tartrate     Past Medical History  Diagnosis Date  . History of colonic polyps   . Hypertension   . Cerebral aneurysm   . 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 Inguinal hernia repair  child   bilateral  . Tonsillectomy    . Right testicle removed  1978  . Abdominal exploration surgery  1978  . Prolactinoma excision  09/04    [name]  . Esophagogastroduodenoscopy  03/08     and colon  . Appendectomy      at same time as lymph node dissection  . Stress nuclear  negative    10/09  . Mohs surgery Left nose    at [facility]--- 2013    Family History  Problem Relation Age of Onset  . Cancer Brother     melanoma  . Cancer Paternal Aunt     lung  . Cancer Other     strong in family    Review of Systems  Constitutional: Positive for unexpected weight change. Negative for fatigue.       Wears seat belt  HENT: Positive for dental problem, hearing loss and tinnitus.        Needs tooth taken care of---has appt with periodontist  Eyes: Negative for visual disturbance.       No diplopia or unilateral vision loss Increased eye drainage  Respiratory: Positive for chest tightness and shortness of breath. Negative for cough.   Cardiovascular: Positive for palpitations. Negative for chest pain and leg swelling.       Rare racing heart---better with calming breathing  Gastrointestinal: Negative for nausea, vomiting and abdominal pain.       Soft  stools Blood on paper Ongoing reflux  Endocrine: Positive for polydipsia and polyuria. Negative for cold intolerance and heat intolerance.  Genitourinary: Positive for difficulty urinating. Negative for urgency.       Some dribbling No sex recently---he has lost desire  Musculoskeletal: Positive for arthralgias. Negative for back pain and joint swelling.       Pain in hands, knees, shoulders  Skin: Positive for rash.       Some scaly places--nothing suspicious  Allergic/Immunologic: Positive for environmental allergies. Negative for immunocompromised state.       No meds--rare sudafed  Neurological: Positive for dizziness, weakness, light-headedness and numbness. Negative for syncope.       Dizzy and lightheaded if looking up or  standing quickly  Psychiatric/Behavioral: Negative for sleep disturbance and dysphoric mood. The patient is not nervous/anxious.        Objective:   Physical Exam  Constitutional: He is oriented to person, place, and time. He appears well-developed and well-nourished. No distress.  HENT:  Head: Normocephalic and atraumatic.  Right Ear: External ear normal.  Left Ear: External ear normal.  Mouth/Throat: Oropharynx is clear and moist. No oropharyngeal exudate.  Eyes: Conjunctivae and EOM are normal. Pupils are equal, round, and reactive to light.  Neck: Normal range of motion. Neck supple. No thyromegaly present.  Cardiovascular: Normal rate, regular rhythm, normal heart sounds and intact distal pulses.  Exam reveals no gallop.   No murmur heard. Pulmonary/Chest: Effort normal and breath sounds normal. No respiratory distress. He has no wheezes. He has no rales.  Abdominal: Soft. There is no tenderness.  Genitourinary:  No clear hemorrhoids Slightly tender with rectal but not really on the prostate--normal palpation  Musculoskeletal: He exhibits no edema and no tenderness.  Lymphadenopathy:    He has no cervical adenopathy.  Neurological: He is alert and oriented to person, place, and time.  No hand weakness or change in muscle bulk  Skin: No rash noted. No erythema.  Psychiatric: He has a normal mood and affect.          Assessment & Plan:

## 2013-11-28 NOTE — Assessment & Plan Note (Signed)
Several concerns today UTD on colon Will check PSA Lifestyle info given

## 2013-11-28 NOTE — Assessment & Plan Note (Signed)
Will try HCTZ

## 2013-11-28 NOTE — Assessment & Plan Note (Signed)
Found incidentally 10 years ago Strong FH of aneurysm death Will check MRA (discussed best option with neuroradiologist)

## 2013-11-28 NOTE — Assessment & Plan Note (Signed)
Probably carpal tunnel again---has had diagnosis made in past by Dr Christella Noa Check B12 since some numnbess in legs also Will send for hand surgery

## 2013-11-28 NOTE — Assessment & Plan Note (Signed)
EKG and spirometry normal Probably deconditioning Discussed fitness---if no help with working out more, consider cardiology eval

## 2013-11-28 NOTE — Progress Notes (Signed)
Pre-visit discussion using our clinic review tool. No additional management support is needed unless otherwise documented below in the visit note.  

## 2013-11-29 ENCOUNTER — Telehealth: Payer: Self-pay

## 2013-11-29 MED ORDER — BUPROPION HCL ER (SR) 150 MG PO TB12: 150.0000 mg | ORAL_TABLET | Freq: Two times a day (BID) | ORAL | Status: DC

## 2013-11-29 NOTE — Telephone Encounter (Signed)
He does note some easy anger and wants to go back on the bupropion. Will send Rx back  Decided to cancel the MRA because he had had a MRI in 2012 Despite the contrast, the mechanism of the slices makes it hard to exclude an aneurysm there Discussed with neuroradiologist again--he felt that there wasn't a big one for sure--but would need MRA to be sure Left message in this regard for patient in case he wants to reschedule the MRA

## 2013-11-29 NOTE — Telephone Encounter (Signed)
Pt said he was seen 11/28/13 and pt wants to further discuss wellbutrin that pt has previously taken. Pt does not want to go into further detail with me and request cb from Dr Silvio Pate at his convenience.[phone].

## 2013-12-02 ENCOUNTER — Other Ambulatory Visit: Payer: BC Managed Care – PPO

## 2013-12-25 ENCOUNTER — Ambulatory Visit: Payer: BC Managed Care – PPO | Admitting: Internal Medicine

## 2013-12-31 ENCOUNTER — Other Ambulatory Visit: Payer: Self-pay | Admitting: Internal Medicine

## 2014-06-18 ENCOUNTER — Ambulatory Visit (INDEPENDENT_AMBULATORY_CARE_PROVIDER_SITE_OTHER): Payer: BC Managed Care – PPO | Admitting: Internal Medicine

## 2014-06-18 ENCOUNTER — Encounter: Payer: Self-pay | Admitting: Internal Medicine

## 2014-06-18 VITALS — BP 140/80 | HR 88 | Temp 98.0°F | Wt 223.0 lb

## 2014-06-18 DIAGNOSIS — M25569 Pain in unspecified knee: Secondary | ICD-10-CM

## 2014-06-18 DIAGNOSIS — S0180XA Unspecified open wound of other part of head, initial encounter: Secondary | ICD-10-CM

## 2014-06-18 DIAGNOSIS — Z5189 Encounter for other specified aftercare: Secondary | ICD-10-CM

## 2014-06-18 DIAGNOSIS — M25562 Pain in left knee: Secondary | ICD-10-CM

## 2014-06-18 DIAGNOSIS — S0181XA Laceration without foreign body of other part of head, initial encounter: Secondary | ICD-10-CM | POA: Insufficient documentation

## 2014-06-18 DIAGNOSIS — S0181XD Laceration without foreign body of other part of head, subsequent encounter: Secondary | ICD-10-CM

## 2014-06-18 NOTE — Progress Notes (Signed)
   Subjective:    Patient ID: Dustin [name], male    DOB: Mar 11, 1956, 58 y.o.   MRN: [id number]  HPI Leaned over trying to get gators on--- 7 days ago [address] onto [employer] out briefly [facility] [place] [facility] in No apparent concussion No ongoing pain  Also left knee pain Hard to get up from kneeling No swelling Able to maintain his activity 2 aleve in AM and brace do help Hurts mostly in the back of knee  Current Outpatient Prescriptions on File Prior to Visit  Medication Sig Dispense Refill  . amLODipine (NORVASC) 5 MG tablet TAKE 1 TABLET BY MOUTH EVERY DAY FOR HIGH BLOOD PRESSURE  30 tablet  11  . buPROPion (WELLBUTRIN SR) 150 MG 12 hr tablet Take 1 tablet (150 mg total) by mouth 2 (two) times daily.  60 tablet  11  . VIAGRA 100 MG tablet TAKE 1 TABLET (100 MG TOTAL) BY MOUTH DAILY AS NEEDED FOR ERECTILE DYSFUNCTION.  6 tablet  3   No current facility-administered medications on file prior to visit.    Allergies  Allergen Reactions  . Omeprazole-Sodium Bicarbonate     REACTION: Didn't feel good  . Varenicline Tartrate     Past Medical History  Diagnosis Date  . History of colonic polyps   . Hypertension   . Cerebral aneurysm   . 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 Inguinal hernia repair  child    bilateral  . Tonsillectomy    . Right testicle removed  1978  . Abdominal exploration surgery  1978  . Prolactinoma excision  09/04    [name]  . Esophagogastroduodenoscopy  03/08     and colon  . Appendectomy      at same time as lymph node dissection  . Stress nuclear  negative    10/09  . Mohs surgery Left nose    at [facility]--- 2013    Family History  Problem Relation Age of Onset  . Cancer Brother     melanoma  . Cancer Paternal Aunt     lung  . Cancer Other     strong in family    History   Social History  . Marital  Status: Significant Other    Spouse Name: N/A    Number of Children: 0  . 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 Smoking status: Former Smoker -- 1.50 packs/day for 40 years    Quit date: 04/25/2012  . Smokeless tobacco: Never Used  . Alcohol Use: Yes     Comment: occassionally  . Drug Use: No  . Sexual Activity: Not on file   Other Topics Concern  . Not on file   Social History Narrative             Review of Systems No fever No redness    Objective:   Physical Exam  Constitutional: He appears well-developed and well-nourished. No distress.  HENT:  ~83mm laceration across mid forehead 2 staples removed steristrips applied  Musculoskeletal:  Left knee without effusion  Normal ROM No meniscus or ligament findings          Assessment & Plan:

## 2014-06-18 NOTE — Assessment & Plan Note (Signed)
No major findings Discussed quad strengthening No need for referral now

## 2014-06-18 NOTE — Assessment & Plan Note (Signed)
Staples removed Steri strips applied

## 2014-06-18 NOTE — Progress Notes (Signed)
Pre visit review using our clinic review tool, if applicable. No additional management support is needed unless otherwise documented below in the visit note. 

## 2014-07-04 DIAGNOSIS — S83249A Other tear of medial meniscus, current injury, unspecified knee, initial encounter: Secondary | ICD-10-CM | POA: Insufficient documentation

## 2014-07-18 ENCOUNTER — Emergency Department: Payer: Self-pay | Admitting: Emergency Medicine

## 2014-07-18 LAB — CBC
HCT: 47.7 % (ref 40.0–52.0)
HGB: 15.4 g/dL (ref 13.0–18.0)
MCH: 30.1 pg (ref 26.0–34.0)
MCHC: 32.3 g/dL (ref 32.0–36.0)
MCV: 93 fL (ref 80–100)
PLATELETS: 149 10*3/uL — AB (ref 150–440)
RBC: 5.11 10*6/uL (ref 4.40–5.90)
RDW: 12.7 % (ref 11.5–14.5)
WBC: 6.4 10*3/uL (ref 3.8–10.6)

## 2014-07-18 LAB — BASIC METABOLIC PANEL
ANION GAP: 7 (ref 7–16)
BUN: 14 mg/dL (ref 7–18)
CHLORIDE: 107 mmol/L (ref 98–107)
Calcium, Total: 8.5 mg/dL (ref 8.5–10.1)
Co2: 24 mmol/L (ref 21–32)
Creatinine: 1.05 mg/dL (ref 0.60–1.30)
EGFR (Non-African Amer.): >60
GLUCOSE: 93 mg/dL (ref 65–99)
Osmolality: 276 (ref 275–301)
Potassium: 4.4 mmol/L (ref 3.5–5.1)
SODIUM: 138 mmol/L (ref 136–145)

## 2014-08-13 ENCOUNTER — Other Ambulatory Visit: Payer: Self-pay | Admitting: Internal Medicine

## 2014-12-21 ENCOUNTER — Other Ambulatory Visit: Payer: Self-pay | Admitting: Internal Medicine

## 2014-12-23 ENCOUNTER — Ambulatory Visit (INDEPENDENT_AMBULATORY_CARE_PROVIDER_SITE_OTHER): Payer: BLUE CROSS/BLUE SHIELD | Admitting: Internal Medicine

## 2014-12-23 ENCOUNTER — Encounter: Payer: Self-pay | Admitting: Internal Medicine

## 2014-12-23 VITALS — BP 140/80 | HR 71 | Temp 97.7°F | Ht 69.0 in | Wt 234.0 lb

## 2014-12-23 DIAGNOSIS — M25562 Pain in left knee: Secondary | ICD-10-CM

## 2014-12-23 DIAGNOSIS — I10 Essential (primary) hypertension: Secondary | ICD-10-CM

## 2014-12-23 DIAGNOSIS — Z0000 Encounter for general adult medical examination without abnormal findings: Secondary | ICD-10-CM

## 2014-12-23 DIAGNOSIS — E785 Hyperlipidemia, unspecified: Secondary | ICD-10-CM

## 2014-12-23 LAB — COMPREHENSIVE METABOLIC PANEL
ALT: 41 U/L (ref 0–53)
AST: 72 U/L — ABNORMAL HIGH (ref 0–37)
Albumin: 4.1 g/dL (ref 3.5–5.2)
Alkaline Phosphatase: 71 U/L (ref 39–117)
BILIRUBIN TOTAL: 0.4 mg/dL (ref 0.2–1.2)
BUN: 21 mg/dL (ref 6–23)
CALCIUM: 9 mg/dL (ref 8.4–10.5)
CHLORIDE: 104 meq/L (ref 96–112)
CO2: 27 mEq/L (ref 19–32)
Creatinine, Ser: 1.21 mg/dL (ref 0.40–1.50)
GFR: 65.04 mL/min (ref >60.00)
GLUCOSE: 84 mg/dL (ref 70–99)
Potassium: 4.2 mEq/L (ref 3.5–5.1)
Sodium: 137 mEq/L (ref 135–145)
TOTAL PROTEIN: 6.4 g/dL (ref 6.0–8.3)

## 2014-12-23 LAB — CBC WITH DIFFERENTIAL/PLATELET
Basophils Absolute: 0 10*3/uL (ref 0.0–0.1)
Basophils Relative: 0.5 % (ref 0.0–3.0)
EOS PCT: 4.1 % (ref 0.0–5.0)
Eosinophils Absolute: 0.3 10*3/uL (ref 0.0–0.7)
HEMATOCRIT: 44.9 % (ref 39.0–52.0)
Hemoglobin: 15.2 g/dL (ref 13.0–17.0)
LYMPHS ABS: 2 10*3/uL (ref 0.7–4.0)
Lymphocytes Relative: 30.4 % (ref 12.0–46.0)
MCHC: 33.9 g/dL (ref 30.0–36.0)
MCV: 91 fl (ref 78.0–100.0)
MONO ABS: 0.4 10*3/uL (ref 0.1–1.0)
Monocytes Relative: 6.2 % (ref 3.0–12.0)
Neutro Abs: 3.9 10*3/uL (ref 1.4–7.7)
Neutrophils Relative %: 58.8 % (ref 43.0–77.0)
PLATELETS: 182 10*3/uL (ref 150.0–400.0)
RBC: 4.93 Mil/uL (ref 4.22–5.81)
RDW: 13 % (ref 11.5–15.5)
WBC: 6.6 10*3/uL (ref 4.0–10.5)

## 2014-12-23 LAB — LIPID PANEL
CHOLESTEROL: 198 mg/dL (ref 0–200)
HDL: 44.8 mg/dL (ref >39.00)
LDL Cholesterol: 122 mg/dL — ABNORMAL HIGH (ref 0–99)
NonHDL: 153.2
TRIGLYCERIDES: 154 mg/dL — AB (ref 0.0–149.0)
Total CHOL/HDL Ratio: 4
VLDL: 30.8 mg/dL (ref 0.0–40.0)

## 2014-12-23 LAB — T4, FREE: FREE T4: 0.69 ng/dL (ref 0.60–1.60)

## 2014-12-23 NOTE — Assessment & Plan Note (Signed)
Reviewed ortho note Discussed tylenol and quad strengthening

## 2014-12-23 NOTE — Assessment & Plan Note (Signed)
He is not interested in statin for primary prevention

## 2014-12-23 NOTE — Patient Instructions (Addendum)
Please try taking tylenol arthritis regularly 3 times a day.  DASH Eating Plan DASH stands for "Dietary Approaches to Stop Hypertension." The DASH eating plan is a healthy eating plan that has been shown to reduce high blood pressure (hypertension). Additional health benefits may include reducing the risk of type 2 diabetes mellitus, heart disease, and stroke. The DASH eating plan may also help with weight loss. WHAT DO I NEED TO KNOW ABOUT THE DASH EATING PLAN? For the DASH eating plan, you will follow these general guidelines:  Choose foods with a percent daily value for sodium of less than 5% (as listed on the food label).  Use salt-free seasonings or herbs instead of table salt or sea salt.  Check with your health care provider or pharmacist before using salt substitutes.  Eat lower-sodium products, often labeled as "lower sodium" or "no salt added."  Eat fresh foods.  Eat more vegetables, fruits, and low-fat dairy products.  Choose whole grains. Look for the word "whole" as the first word in the ingredient list.  Choose fish and skinless chicken or [place] more often than red meat. Limit fish, poultry, and meat to 6 oz (170 g) each day.  Limit sweets, desserts, sugars, and sugary drinks.  Choose heart-healthy fats.  Limit cheese to 1 oz (28 g) per day.  Eat more home-cooked food and less restaurant, buffet, and fast food.  Limit fried foods.  Cook foods using methods other than frying.  Limit canned vegetables. If you do use them, rinse them well to decrease the sodium.  When eating at a restaurant, ask that your food be prepared with less salt, or no salt if possible. WHAT FOODS CAN I EAT? Seek help from a dietitian for individual calorie needs. Grains Whole grain or whole wheat bread. Brown rice. Whole grain or whole wheat pasta. Quinoa, bulgur, and whole grain cereals. Low-sodium cereals. Corn or whole wheat flour tortillas. Whole grain cornbread. Whole grain crackers.  Low-sodium crackers. Vegetables Fresh or frozen vegetables (raw, steamed, roasted, or grilled). Low-sodium or reduced-sodium tomato and vegetable juices. Low-sodium or reduced-sodium tomato sauce and paste. Low-sodium or reduced-sodium canned vegetables.  Fruits All fresh, canned (in natural juice), or frozen fruits. Meat and Other Protein Products Ground beef (85% or leaner), grass-fed beef, or beef trimmed of fat. Skinless chicken or [place]. Ground chicken or [place]. Pork trimmed of fat. All fish and seafood. Eggs. Dried beans, peas, or lentils. Unsalted nuts and seeds. Unsalted canned beans. Dairy Low-fat dairy products, such as skim or 1% milk, 2% or reduced-fat cheeses, low-fat ricotta or cottage cheese, or plain low-fat yogurt. Low-sodium or reduced-sodium cheeses. Fats and Oils Tub margarines without trans fats. Light or reduced-fat mayonnaise and salad dressings (reduced sodium). Avocado. Safflower, olive, or canola oils. Natural peanut or almond butter. Other Unsalted popcorn and pretzels. The items listed above may not be a complete list of recommended foods or beverages. Contact your dietitian for more options. WHAT FOODS ARE NOT RECOMMENDED? Grains White bread. White pasta. White rice. Refined cornbread. Bagels and croissants. Crackers that contain trans fat. Vegetables Creamed or fried vegetables. Vegetables in a cheese sauce. Regular canned vegetables. Regular canned tomato sauce and paste. Regular tomato and vegetable juices. Fruits Dried fruits. Canned fruit in light or heavy syrup. Fruit juice. Meat and Other Protein Products Fatty cuts of meat. Ribs, chicken wings, bacon, sausage, bologna, salami, chitterlings, fatback, hot dogs, bratwurst, and packaged luncheon meats. Salted nuts and seeds. Canned beans with salt. Dairy Whole or 2%  milk, cream, half-and-half, and cream cheese. Whole-fat or sweetened yogurt. Full-fat cheeses or blue cheese. Nondairy creamers and whipped  toppings. Processed cheese, cheese spreads, or cheese curds. Condiments Onion and garlic salt, seasoned salt, table salt, and sea salt. Canned and packaged gravies. Worcestershire sauce. Tartar sauce. Barbecue sauce. Teriyaki sauce. Soy sauce, including reduced sodium. Steak sauce. Fish sauce. Oyster sauce. Cocktail sauce. Horseradish. Ketchup and mustard. Meat flavorings and tenderizers. Bouillon cubes. Hot sauce. Tabasco sauce. Marinades. Taco seasonings. Relishes. Fats and Oils Butter, stick margarine, lard, shortening, ghee, and bacon fat. Coconut, palm kernel, or palm oils. Regular salad dressings. Other Pickles and olives. Salted popcorn and pretzels. The items listed above may not be a complete list of foods and beverages to avoid. Contact your dietitian for more information. WHERE CAN I FIND MORE INFORMATION? National Heart, Lung, and Blood Institute: travelstabloid.com Document Released: 09/16/2011 Document Revised: 02/11/2014 Document Reviewed: 08/01/2013 [facility] Patient Information 2015 [place], [place]. This information is not intended to replace advice given to you by your health care provider. Make sure you discuss any questions you have with your health care provider.

## 2014-12-23 NOTE — Assessment & Plan Note (Signed)
BP Readings from Last 3 Encounters:  12/23/14 140/80  06/18/14 140/80  11/28/13 140/80   Reasonable control No changes needed

## 2014-12-23 NOTE — Progress Notes (Signed)
Subjective:    Patient ID: Dustin [name], male    DOB: 04-Jun-1956, 59 y.o.   MRN: [id number]  HPI Here for physical  Having trouble with his knees and hips  Did 6 mile hike this weekend --really hard on his left shin and knee Went to [place] [facility] just in case--- arteries are okay. Mild venous disease Did see ortho also Left knee gets stiff easily  Still has some hand numbness Saw hand surgeon-- paid for  braces but he never picked them up Occasionally drops things with right hand--but strength fairly stable (functions okay) Night pain at times Has started joint supplement and vitamin No success at losing weight lately  Discussed his cholesterol Not interested in trying a statin  Current Outpatient Prescriptions on File Prior to Visit  Medication Sig Dispense Refill  . amLODipine (NORVASC) 5 MG tablet TAKE 1 TABLET BY MOUTH EVERY DAY FOR HIGH BLOOD PRESSURE 30 tablet 11  . VIAGRA 100 MG tablet TAKE 1 TABLET (100 MG TOTAL) BY MOUTH DAILY AS NEEDED FOR ERECTILE DYSFUNCTION. 6 tablet 3   No current facility-administered medications on file prior to visit.    Allergies  Allergen Reactions  . Omeprazole-Sodium Bicarbonate     REACTION: Didn't feel good  . Varenicline Tartrate     Past Medical History  Diagnosis Date  . History of colonic polyps   . Hypertension   . Cerebral aneurysm   . 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 Inguinal hernia repair  child    bilateral  . Tonsillectomy    . Right testicle removed  1978  . Abdominal exploration surgery  1978  . Prolactinoma excision  09/04    [name]  . Esophagogastroduodenoscopy  03/08     and colon  . Appendectomy      at same time as lymph node dissection  . Stress nuclear  negative    10/09  . Mohs surgery Left nose    at [facility]--- 2013    Family History  Problem Relation Age of Onset  . Cancer Brother    melanoma  . Cancer Paternal Aunt     lung  . Cancer Other     strong in family    History   Social History  . Marital Status: Divorced    Spouse Name: N/A  . Number of Children: 0  . 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 Smoking status: Former Smoker -- 1.50 packs/day for 40 years    Quit date: 04/25/2012  . Smokeless tobacco: Never Used  . Alcohol Use: Yes     Comment: occassionally  . Drug Use: No  . Sexual Activity: Not on file   Other Topics Concern  . Not on file   Social History Narrative             Review of Systems  Constitutional: Negative for fatigue and unexpected weight change.       Wears seat belt Quit cigarettes about 3 years ago--weight up 20# since then  HENT: Positive for hearing loss and tinnitus. Negative for dental problem and trouble swallowing.        Vertigo is better since Rx for sinuses this past winter  Eyes: Negative for visual disturbance.       No diplopia or unilateral vision loss  Respiratory: Negative for cough, chest tightness and shortness of breath.   Cardiovascular: Positive for leg swelling. Negative for chest pain and palpitations.  Gastrointestinal: Negative for nausea, vomiting, abdominal pain, constipation and blood in stool.       Uses tums at bedtime for indigestion Ranitidine 150 every morning   Endocrine: Negative for polydipsia and polyuria.  Genitourinary: Negative for urgency, frequency and difficulty urinating.       Nocturia x 2-3 at night Some dribbling ED persists--- but sex isn't too important to them now (med does help-needs 1/2 now)  Musculoskeletal: Positive for arthralgias. Negative for back pain and joint swelling.  Skin: Negative for rash.       No suspicious lesions  Allergic/Immunologic: Negative for environmental allergies and immunocompromised state.  Neurological: Positive for numbness and headaches. Negative for dizziness,  syncope and light-headedness.  Hematological: Negative for adenopathy. Bruises/bleeds easily.  Psychiatric/Behavioral: Negative for sleep disturbance and dysphoric mood. The patient is not nervous/anxious.        Took bupropion to stop smoking Continued since it seem to help mood       Objective:   Physical Exam  Constitutional: He is oriented to person, place, and time. He appears well-developed and well-nourished. No distress.  HENT:  Head: Normocephalic and atraumatic.  Right Ear: External ear normal.  Left Ear: External ear normal.  Mouth/Throat: Oropharynx is clear and moist. No oropharyngeal exudate.  Eyes: Conjunctivae and EOM are normal. Pupils are equal, round, and reactive to light.  Neck: Normal range of motion. Neck supple. No thyromegaly present.  Cardiovascular: Normal rate, regular rhythm, normal heart sounds and intact distal pulses.  Exam reveals no gallop.   No murmur heard. Pulmonary/Chest: Effort normal and breath sounds normal. No respiratory distress. He has no wheezes. He has no rales.  Abdominal: Soft. There is no tenderness.  Musculoskeletal: He exhibits no edema or tenderness.  Lymphadenopathy:    He has no cervical adenopathy.  Neurological: He is alert and oriented to person, place, and time.  Skin: No rash noted. No erythema.  Psychiatric: He has a normal mood and affect. His behavior is normal.          Assessment & Plan:

## 2014-12-23 NOTE — Assessment & Plan Note (Signed)
Healthy but needs to work on fitness Discussed using the braces for CTS to see if that helps UTD on colon--due 2018  Will defer PSA till at least next year

## 2014-12-23 NOTE — Progress Notes (Signed)
Pre visit review using our clinic review tool, if applicable. No additional management support is needed unless otherwise documented below in the visit note. 

## 2014-12-24 ENCOUNTER — Telehealth: Payer: Self-pay | Admitting: Internal Medicine

## 2014-12-24 NOTE — Telephone Encounter (Signed)
emmi emailed °

## 2015-02-24 ENCOUNTER — Other Ambulatory Visit: Payer: Self-pay | Admitting: Internal Medicine

## 2015-05-15 ENCOUNTER — Other Ambulatory Visit: Payer: Self-pay | Admitting: Internal Medicine

## 2015-05-15 NOTE — Telephone Encounter (Signed)
Approved: okay #[id number] x 11

## 2015-05-15 NOTE — Telephone Encounter (Signed)
rx sent to pharmacy by e-script  

## 2015-05-15 NOTE — Telephone Encounter (Signed)
Ok to fill?  12/31/2013

## 2015-09-02 ENCOUNTER — Other Ambulatory Visit: Payer: Self-pay | Admitting: Internal Medicine

## 2015-11-10 DIAGNOSIS — M7540 Impingement syndrome of unspecified shoulder: Secondary | ICD-10-CM | POA: Insufficient documentation

## 2015-11-10 DIAGNOSIS — M5412 Radiculopathy, cervical region: Secondary | ICD-10-CM | POA: Insufficient documentation

## 2015-12-29 ENCOUNTER — Ambulatory Visit (INDEPENDENT_AMBULATORY_CARE_PROVIDER_SITE_OTHER): Payer: BLUE CROSS/BLUE SHIELD | Admitting: Internal Medicine

## 2015-12-29 ENCOUNTER — Encounter: Payer: Self-pay | Admitting: Internal Medicine

## 2015-12-29 VITALS — BP 134/90 | HR 88 | Temp 97.8°F | Ht 69.0 in | Wt 232.5 lb

## 2015-12-29 DIAGNOSIS — I10 Essential (primary) hypertension: Secondary | ICD-10-CM | POA: Diagnosis not present

## 2015-12-29 DIAGNOSIS — Z125 Encounter for screening for malignant neoplasm of prostate: Secondary | ICD-10-CM

## 2015-12-29 DIAGNOSIS — L570 Actinic keratosis: Secondary | ICD-10-CM | POA: Diagnosis not present

## 2015-12-29 DIAGNOSIS — K219 Gastro-esophageal reflux disease without esophagitis: Secondary | ICD-10-CM

## 2015-12-29 DIAGNOSIS — F39 Unspecified mood [affective] disorder: Secondary | ICD-10-CM

## 2015-12-29 DIAGNOSIS — E785 Hyperlipidemia, unspecified: Secondary | ICD-10-CM

## 2015-12-29 DIAGNOSIS — F17200 Nicotine dependence, unspecified, uncomplicated: Secondary | ICD-10-CM

## 2015-12-29 DIAGNOSIS — Z1211 Encounter for screening for malignant neoplasm of colon: Secondary | ICD-10-CM

## 2015-12-29 DIAGNOSIS — Z0001 Encounter for general adult medical examination with abnormal findings: Secondary | ICD-10-CM

## 2015-12-29 DIAGNOSIS — Z0000 Encounter for general adult medical examination without abnormal findings: Secondary | ICD-10-CM

## 2015-12-29 DIAGNOSIS — M25512 Pain in left shoulder: Secondary | ICD-10-CM

## 2015-12-29 LAB — COMPREHENSIVE METABOLIC PANEL
ALT: 25 U/L (ref 0–53)
AST: 20 U/L (ref 0–37)
Albumin: 4.2 g/dL (ref 3.5–5.2)
Alkaline Phosphatase: 77 U/L (ref 39–117)
BUN: 13 mg/dL (ref 6–23)
CALCIUM: 9.2 mg/dL (ref 8.4–10.5)
CHLORIDE: 102 meq/L (ref 96–112)
CO2: 27 meq/L (ref 19–32)
CREATININE: 1.19 mg/dL (ref 0.40–1.50)
GFR: 66.08 mL/min (ref >60.00)
Glucose, Bld: 90 mg/dL (ref 70–99)
Potassium: 3.9 mEq/L (ref 3.5–5.1)
Sodium: 137 mEq/L (ref 135–145)
TOTAL PROTEIN: 6.5 g/dL (ref 6.0–8.3)
Total Bilirubin: 0.5 mg/dL (ref 0.2–1.2)

## 2015-12-29 LAB — CBC WITH DIFFERENTIAL/PLATELET
BASOS PCT: 0.6 % (ref 0.0–3.0)
Basophils Absolute: 0 10*3/uL (ref 0.0–0.1)
Eosinophils Absolute: 0.4 10*3/uL (ref 0.0–0.7)
Eosinophils Relative: 4.5 % (ref 0.0–5.0)
HEMATOCRIT: 46.3 % (ref 39.0–52.0)
Hemoglobin: 16.1 g/dL (ref 13.0–17.0)
LYMPHS PCT: 23.5 % (ref 12.0–46.0)
Lymphs Abs: 2 10*3/uL (ref 0.7–4.0)
MCHC: 34.7 g/dL (ref 30.0–36.0)
MCV: 90.5 fl (ref 78.0–100.0)
MONOS PCT: 6 % (ref 3.0–12.0)
Monocytes Absolute: 0.5 10*3/uL (ref 0.1–1.0)
NEUTROS ABS: 5.5 10*3/uL (ref 1.4–7.7)
Neutrophils Relative %: 65.4 % (ref 43.0–77.0)
PLATELETS: 202 10*3/uL (ref 150.0–400.0)
RBC: 5.12 Mil/uL (ref 4.22–5.81)
RDW: 13 % (ref 11.5–15.5)
WBC: 8.4 10*3/uL (ref 4.0–10.5)

## 2015-12-29 NOTE — Assessment & Plan Note (Signed)
BP Readings from Last 3 Encounters:  12/29/15 134/90  12/23/14 140/80  06/18/14 140/80   Reasonable control No change needed

## 2015-12-29 NOTE — Progress Notes (Signed)
Pre visit review using our clinic review tool, if applicable. No additional management support is needed unless otherwise documented below in the visit note. 

## 2015-12-29 NOTE — Assessment & Plan Note (Signed)
Healthy but has gotten out of shape Will check PSA after discussion Refer for colon

## 2015-12-29 NOTE — Progress Notes (Signed)
Subjective:    Patient ID: Dustin [name], male    DOB: [birth year], 60 y.o.   MRN: [id number]  HPI Here for physical  Having bad pain in left shoulder Notices it especially when using shoulder harness in car 2 cortisone shots in shoulder--helped the first time. For biceps tendonitis. 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 amLODipine (NORVASC) 5 MG tablet TAKE 1 TABLET BY MOUTH EVERY DAY FOR HIGH BLOOD PRESSURE 30 tablet 11  . buPROPion (WELLBUTRIN SR) 150 MG 12 hr tablet TAKE 1 TABLET (150 MG TOTAL) BY MOUTH 2 (TWO) TIMES DAILY. 60 tablet 11  . VIAGRA 100 MG tablet TAKE 1 TABLET BY MOUTH AS NEEDED FOR ERECTILE DYSFUNCTION 6 tablet 11   No current facility-administered medications on file prior to visit.    Allergies  Allergen Reactions  . Omeprazole-Sodium Bicarbonate     REACTION: Didn't feel good  . Varenicline Tartrate     Past Medical History  Diagnosis Date  . History of colonic polyps   . Hypertension   . Cerebral aneurysm   . Prolactinoma ([place]) 08/04  . Testicular cancer [facility]) 1978    teratocarcinoma    Past Surgical History  Procedure Laterality Date  . Inguinal hernia repair  child    bilateral  . Tonsillectomy    . Right testicle removed  1978  . Abdominal exploration surgery  1978  . Prolactinoma excision  09/04    [name]  . Esophagogastroduodenoscopy  03/08     and colon  . Appendectomy      at same time as lymph node dissection  .  Stress nuclear  negative    10/09  . Mohs surgery Left nose    at [facility]--- 2013    Family History  Problem Relation Age of Onset  . Cancer Brother     melanoma  . Cancer Paternal Aunt     lung  . Cancer Other     strong in family    Social History   Social History  . Marital Status: Divorced    Spouse Name: N/A  . Number of Children: 0  . 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 Smoking status: Former Smoker -- 1.50 packs/day for 40 years    Quit date: 04/25/2012  . Smokeless tobacco: Never Used  . Alcohol Use: Yes     Comment: occassionally  . Drug Use: No  . Sexual Activity: Not on file   Other Topics Concern  . Not on file   Social History Narrative             Review of Systems  Constitutional: Negative for fatigue and unexpected weight change.       Wears seat belt  HENT: Positive for hearing loss, tinnitus and trouble swallowing.        Does use hearing protection when he shoots--sees  Dr Monia Sabal up with dentist  Eyes: Negative for visual disturbance.       No diplopia or unilateral vision loss  Respiratory: Negative for chest tightness and shortness of breath.        Some cough with respiratory infection  Cardiovascular: Negative for chest pain, palpitations and leg swelling.  Gastrointestinal: Negative for nausea, abdominal pain and blood in stool.       Regular heartburn   Endocrine: Negative for polydipsia.  Genitourinary: Positive for frequency and difficulty urinating.       Slow stream at times.   Musculoskeletal: Positive for arthralgias. Negative for back pain.  Skin: Negative for rash.       Some axillary skin tags at times  Allergic/Immunologic: Positive for environmental allergies. Negative for immunocompromised state.  Neurological: Positive for dizziness. Negative for syncope and light-headedness.       Some dizziness with sinus problems. Left arm weakness  with shoulder problems  Hematological: Negative for adenopathy. Does not bruise/bleed easily.  Psychiatric/Behavioral: Negative for sleep disturbance and dysphoric mood.       Does get on edge at times---doing well recently       Objective:   Physical Exam  Constitutional: He is oriented to person, place, and time. He appears well-developed and well-nourished. No distress.  HENT:  Head: Normocephalic and atraumatic.  Right Ear: External ear normal.  Left Ear: External ear normal.  Mouth/Throat: Oropharynx is clear and moist. No oropharyngeal exudate.  Eyes: Conjunctivae are normal. Pupils are equal, round, and reactive to light.  Neck: Normal range of motion. Neck supple. No thyromegaly present.  Cardiovascular: Normal rate, regular rhythm, normal heart sounds and intact distal pulses.  Exam reveals no gallop.   No murmur heard. Pulmonary/Chest: Effort normal and breath sounds normal. No respiratory distress. He has no wheezes. He has no rales.  Abdominal: Soft. There is no tenderness.  Musculoskeletal: He exhibits no edema or tenderness.  Lymphadenopathy:    He has no cervical adenopathy.  Neurological: He is alert and oriented to person, place, and time.  Skin: No rash noted.  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 His behavior is normal.          Assessment & Plan:

## 2015-12-29 NOTE — Assessment & Plan Note (Signed)
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

## 2015-12-29 NOTE — Patient Instructions (Signed)
It is okay to try decreasing the dose of the bupropion---try holding one dose a week at first.

## 2015-12-29 NOTE — Assessment & Plan Note (Signed)
Liquid nitrogen 45 seconds x 2 Tolerated well Discussed home care

## 2015-12-29 NOTE — Assessment & Plan Note (Signed)
He will try to wean off but okay to continue if needed

## 2015-12-29 NOTE — Assessment & Plan Note (Signed)
Will defer labs---no meds considered

## 2015-12-29 NOTE — Addendum Note (Signed)
Addended by: Marchia Bond on: 12/29/2015 03:44 PM   Modules accepted: Miquel Dunn

## 2015-12-29 NOTE — Assessment & Plan Note (Signed)
Needs regular meds Can review with GI

## 2015-12-30 ENCOUNTER — Telehealth: Payer: Self-pay

## 2015-12-30 DIAGNOSIS — F17200 Nicotine dependence, unspecified, uncomplicated: Secondary | ICD-10-CM | POA: Insufficient documentation

## 2015-12-30 LAB — PSA: PSA: 1.16 ng/mL (ref 0.10–4.00)

## 2015-12-30 NOTE — Telephone Encounter (Signed)
Pt left v/m; pt was seen on 12/29/15 and has question that he wants to ask to Dr Silvio Pate only. Pt does not want to leave any further information but did say it has to do with mood disorder ([place]) being listed as a diagnosis for the 12/29/15 visit. Pt request cb from Dr Silvio Pate. Pt understands Dr Silvio Pate is seeing pts now.

## 2015-12-30 NOTE — Telephone Encounter (Signed)
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 Discussed that this was related to his stopping smoking so will change the diagnosis to nicotine dependence

## 2015-12-30 NOTE — Addendum Note (Signed)
Addended by: Viviana Simpler I on: 12/30/2015 01:48 PM   Modules accepted: Medications

## 2015-12-30 NOTE — Assessment & Plan Note (Signed)
Has been on daily bupropion to tolerate being off this (or mood would flare) He is trying to wean off now

## 2016-01-12 ENCOUNTER — Encounter: Payer: Self-pay | Admitting: Family Medicine

## 2016-01-12 ENCOUNTER — Ambulatory Visit (INDEPENDENT_AMBULATORY_CARE_PROVIDER_SITE_OTHER): Payer: BLUE CROSS/BLUE SHIELD | Admitting: Family Medicine

## 2016-01-12 VITALS — BP 140/80 | HR 73 | Temp 98.2°F | Ht 69.0 in | Wt 236.0 lb

## 2016-01-12 DIAGNOSIS — R29898 Other symptoms and signs involving the musculoskeletal system: Secondary | ICD-10-CM

## 2016-01-12 DIAGNOSIS — M259 Joint disorder, unspecified: Secondary | ICD-10-CM

## 2016-01-12 DIAGNOSIS — M25512 Pain in left shoulder: Secondary | ICD-10-CM

## 2016-01-12 DIAGNOSIS — M19019 Primary osteoarthritis, unspecified shoulder: Secondary | ICD-10-CM

## 2016-01-12 NOTE — Patient Instructions (Signed)

## 2016-01-12 NOTE — Progress Notes (Signed)
Pre visit review using our clinic review tool, if applicable. No additional management support is needed unless otherwise documented below in the visit note. 

## 2016-01-12 NOTE — Progress Notes (Addendum)
Dr. Frederico Hamman T. Adorian Gwynne, MD, [facility] [place], [address] Phone: [phone] Fax: [phone]  01/12/2016  Patient: Dustin [name], MRN: [id number], DOB: [birth year], 60 y.o.  Primary Physician:  Viviana Simpler, MD   Chief Complaint  Patient presents with  . Shoulder Pain    Left-Radiates down arm   Subjective:   Dustin [name] is a 60 y.o. very pleasant male patient who presents with the following:  Patient known for many years.   Healthy, strong male: RHD, he aggravated a with doing bow hunting, but he has been having problems with this left shoulder now for approximately 2 years. Was holding a heavy compound bow, in the actually moved up to a even heavier compound bow this past year, and he is having difficulty with even using it at all now.   Prior to this he has gone on L context, turbulence, and eardrums, and he killed a record [place] 1-2 year ago with his compound bow.  Last year, saw Dr. Sabra Heck did a cortisone injection and felt better, but then he ? Injured the left. Had injection x 2 from Dr. Sabra Heck. hhe has been very active and done some rehabilitation.   X-rays of Dr. Sabra Heck office did show some mild arthritis.  He did have a distinct injury in the fall this year, moving a deer stand, and his left shoulder was distinctly jolted, and he noted that he had significant worsening of pain and weakness after this event.  Pain goes down his left shoulder.   Infraspinatus. AC crossover  Past Medical History, Surgical History, Social History, Family History, Problem List, Medications, and Allergies have been reviewed and updated if relevant.  Patient Active Problem List   Diagnosis Date Noted  . Nicotine dependence 12/30/2015  . Actinic keratosis 12/29/2015  . Left shoulder pain 12/29/2015  . Routine general medical examination at a health care facility 10/26/2011  . Hyperlipemia 09/22/2009  . GERD  07/12/2008  . 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 Essential hypertension, benign 06/08/2007  . CEREBRAL ANEURYSM 06/08/2007  . 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 History of colonic polyps   . Hypertension   . Cerebral aneurysm   . 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 Inguinal hernia repair  child    bilateral  . Tonsillectomy    . Right testicle removed  1978  . Abdominal exploration surgery  1978  . Prolactinoma excision  09/04    [name]  . Esophagogastroduodenoscopy  03/08     and colon  . Appendectomy      at same time as lymph node dissection  . Stress nuclear  negative    10/09  . Mohs surgery Left nose    at [facility]--- 2013    Social History   Social History  . Marital Status: Divorced    Spouse Name: N/A  . Number of Children: 0  . 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 Smoking status: Former Smoker -- 1.50 packs/day for 40 years    Quit date: 04/25/2012  . Smokeless tobacco: Never Used  . Alcohol Use: Yes     Comment: occassionally  . Drug Use: No  . Sexual Activity: Not  on file   Other Topics Concern  . Not on file   Social History Narrative              Family History  Problem Relation Age of Onset  . Cancer Brother     melanoma  . Cancer Paternal Aunt     lung  . Cancer Other     strong in family    Allergies  Allergen Reactions  . Omeprazole-Sodium Bicarbonate     REACTION: Didn't feel good  . Varenicline Tartrate     Medication list reviewed and updated in full in [place].  GEN: no acute illness or fever CV: No chest pain or shortness of breath MSK: detailed above Neuro: neurological signs are described above ROS O/w per HPI  Objective:   BP 140/80 mmHg  Pulse 73  Temp(Src) 98.2 F (36.8  C) (Oral)  Ht 5\' 9"  (1.753 m)  Wt 236 lb (107.049 kg)  BMI 34.84 kg/m2   GEN: Well-developed,well-nourished,in no acute distress; alert,appropriate and cooperative throughout examination HEENT: Normocephalic and atraumatic without obvious abnormalities. Ears, externally no deformities PULM: Breathing comfortably in no respiratory distress EXT: No clubbing, cyanosis, or edema PSYCH: Normally interactive. Cooperative during the interview. Pleasant. Friendly and conversant. Not anxious or depressed appearing. Normal, full affect.  Shoulder: L Inspection: No muscle wasting or winging Ecchymosis/edema: neg  AC joint, scapula, clavicle: NT Cervical spine: NT, full ROM Spurling's: neg Abduction: full, 4+/5 Flexion: full, 5/5 IR, full, lift-off: 3/5 ER at neutral: full, 5/5 AC crossover: POS Neer: pos Hawkins: pos Drop Test: neg Empty Can: pos Supraspinatus insertion: mild-mod T Bicipital groove: NT Speed's: neg Yergason's: neg Sulcus sign: neg Scapular dyskinesis: none C5-T1 intact  Neuro: Sensation intact Grip 5/5   Radiology: X-rays done at [facility] are not available for my independent review.  Mr Shoulder Left Wo Contrast  01/13/2016  CLINICAL DATA:  Worsening left shoulder pain for 2 years with weakness. EXAM: MRI OF THE LEFT SHOULDER WITHOUT CONTRAST TECHNIQUE: Multiplanar, multisequence MR imaging of the shoulder was performed. No intravenous contrast was administered. COMPARISON:  None. FINDINGS: Rotator cuff: Prominent subscapularis tendinopathy with notable partial thickness articular surface tearing. Moderate supraspinatus and infraspinatus tendinopathy with fissuring in the distal infraspinatus tendon. I do not see a definite full-thickness rotator cuff tear although there is fluid in the subacromial subdeltoid bursa. Muscles: Low-level bands of edema are noted laterally and posteriorly in the deltoid muscle. Edema tracks along the margins of the  supraspinatus and subscapularis muscles. Biceps long head: High-grade partial tearing, with mild medial displacement of the tendon allowed by the articular surface tearing of the subscapularis. Acromioclavicular Joint: Mild degenerative AC joint arthropathy with fluid in the joint and associated spurring. Moderate subacromial subdeltoid bursitis. Glenohumeral Joint: Mild to moderate degenerative glenohumeral chondral thinning. Mild spurring of the humeral head. Trace joint effusion synovitis in the rotator interval and edema in the adipose tissue deep to the coracoid. Labrum:  Degenerated superior labrum, without a definite tear. Bones: Small geode or erosion of the greater tuberosity continuous with some intrasubstance partial thickness tearing of the infraspinatus. IMPRESSION: 1. High-grade partial tearing of the long head of the biceps, medially displaced in the joint. 2. Partial thickness articular surface tearing of the subscapularis with prominent subscapularis tendinopathy. 3. Moderate supraspinatus and infraspinatus tendinopathy 4. Moderate subacromial subdeltoid bursitis. 5. Synovitis in the rotator interval. Mild to moderate degenerative glenohumeral chondral thinning. 6. Degenerated superior labrum. Electronically Signed   By: Van Clines  M.D.   On: 01/13/2016 13:25     Assessment and Plan:   Left shoulder pain - Plan: MR Shoulder Left Wo Contrast  Left arm weakness - Plan: MR Shoulder Left Wo Contrast  AC joint arthropathy  >25 minutes spent in face to face time with patient, >50% spent in counselling or coordination of care  Acute worsening of long-standing shoulder pain with dramatic weakness on his shoulder exam, most notable with external rotation, concerning for tear of his infraspinatus +/- supraspinatus with ac arthropathy.  Obtain an MRI of the left shoulder without contrast to evaluate the patient's rotator cuff.  He was given Xanax 1 mg to take 30 minutes prior to his  MRI.  Addendum, 01/15/2016. I saw the patient on January 12, 2016, and we actually were able to obtain his MRI the following day.  I discussed with him on the phone within a few hours, greater than 15 minute conversation.  The patient has actually called and discussed with me 2 additional times regarding his shoulder, and I have done my best to reassure him.  This is been a two-year long problem, with worsening over the last 6 months and is nonemergent.  He asked for recommendations for shoulder surgeons and I recommended shoulder trained surgeons Dr. Tamera Punt in [place] or Dr. Johna Roles in [place] along with several orthopedic surgeons at [facility].  Initially I had him set up to see Dr. Malena Catholic, shoulder surgeon at [facility], and the patient canceled this appointment.  He made his own appointment with Dr. Melvern Sample at [facility] and this is upcoming.  As do this outside of our radiological PACS system, he will obtain a MRI CD, and he can do so from our office. Electronically Signed  By: Owens Loffler, MD On: 01/15/2016 2:58 PM   Follow-up: No Follow-up on file.  Orders Placed This Encounter  Procedures  . MR Shoulder Left Wo Contrast    Signed,  Haruto Demaria T. Britnay Magnussen, MD   Patient's Medications  New Prescriptions   No medications on file  Previous Medications   AMLODIPINE (NORVASC) 5 MG TABLET    TAKE 1 TABLET BY MOUTH EVERY DAY FOR HIGH BLOOD PRESSURE   BUDESONIDE (RHINOCORT ALLERGY) 32 MCG/ACT NASAL SPRAY    Place 2 sprays into both nostrils daily.   LORATADINE 10 MG CAPS    Take 10 mg by mouth daily.   NAPROXEN SODIUM (ALEVE) 220 MG TABLET    Take 220 mg by mouth 2 (two) times daily with a meal.   RANITIDINE (ZANTAC) 75 MG TABLET    Take 75 mg by mouth 2 (two) times daily.   VIAGRA 100 MG TABLET    TAKE 1 TABLET BY MOUTH AS NEEDED FOR ERECTILE DYSFUNCTION  Modified Medications   No medications on file  Discontinued Medications   BUPROPION (WELLBUTRIN SR) 150 MG 12 HR TABLET     TAKE 1 TABLET (150 MG TOTAL) BY MOUTH 2 (TWO) TIMES DAILY.   RANITIDINE (ZANTAC) 150 MG TABLET    Take 150 mg by mouth 2 (two) times daily.

## 2016-01-13 ENCOUNTER — Ambulatory Visit
Admission: RE | Admit: 2016-01-13 | Discharge: 2016-01-13 | Disposition: A | Discharge status: Home / Self Care | Payer: BLUE CROSS/BLUE SHIELD | Source: Ambulatory Visit | Attending: Family Medicine | Admitting: Family Medicine

## 2016-01-13 ENCOUNTER — Other Ambulatory Visit: Payer: Self-pay | Admitting: Family Medicine

## 2016-01-13 DIAGNOSIS — R29898 Other symptoms and signs involving the musculoskeletal system: Secondary | ICD-10-CM

## 2016-01-13 DIAGNOSIS — M7552 Bursitis of left shoulder: Secondary | ICD-10-CM

## 2016-01-13 DIAGNOSIS — S4382XA Sprain of other specified parts of left shoulder girdle, initial encounter: Secondary | ICD-10-CM

## 2016-01-13 DIAGNOSIS — S46212A Strain of muscle, fascia and tendon of other parts of biceps, left arm, initial encounter: Secondary | ICD-10-CM

## 2016-01-13 DIAGNOSIS — M25512 Pain in left shoulder: Secondary | ICD-10-CM

## 2016-01-13 DIAGNOSIS — S46812A Strain of other muscles, fascia and tendons at shoulder and upper arm level, left arm, initial encounter: Secondary | ICD-10-CM

## 2016-01-13 DIAGNOSIS — M19012 Primary osteoarthritis, left shoulder: Secondary | ICD-10-CM

## 2016-01-15 ENCOUNTER — Telehealth: Payer: Self-pay | Admitting: Family Medicine

## 2016-01-15 NOTE — Telephone Encounter (Signed)
Noted! Thank you

## 2016-01-15 NOTE — Telephone Encounter (Signed)
I spoke to Dustin [name], answered all his questions. He will keep his current appointment.

## 2016-01-15 NOTE — Telephone Encounter (Signed)
I got Mr. Dustin [name] an appointment with Dr. Tamera Punt for 01/28/16. He was not satisfied with this appointment. I explained that April 19 was his 1st available due to him being on vacation and in surgeries. He wants to know is there anyone else you recommend? Or what he should do until then? Or if you think waiting to April 19th is okay? Pt would like you to call him personally. Please advise.

## 2016-01-28 ENCOUNTER — Telehealth: Payer: Self-pay | Admitting: Internal Medicine

## 2016-01-28 NOTE — Telephone Encounter (Signed)
Pt called needing a copy of his mri he had done Please let him know when ready for pick up

## 2016-01-28 NOTE — Telephone Encounter (Signed)
LMOM for pt to pick up disc

## 2016-03-11 HISTORY — PX: SHOULDER OPEN ROTATOR CUFF REPAIR: SHX2407

## 2016-03-31 ENCOUNTER — Telehealth: Payer: Self-pay

## 2016-03-31 ENCOUNTER — Encounter: Payer: Self-pay | Admitting: Family Medicine

## 2016-03-31 ENCOUNTER — Ambulatory Visit (INDEPENDENT_AMBULATORY_CARE_PROVIDER_SITE_OTHER): Payer: BLUE CROSS/BLUE SHIELD | Admitting: Family Medicine

## 2016-03-31 VITALS — BP 176/102 | HR 75 | Temp 98.4°F | Ht 69.0 in | Wt 235.5 lb

## 2016-03-31 DIAGNOSIS — I10 Essential (primary) hypertension: Secondary | ICD-10-CM | POA: Diagnosis not present

## 2016-03-31 MED ORDER — AMLODIPINE BESYLATE 10 MG PO TABS: 10.0000 mg | ORAL_TABLET | Freq: Every day | ORAL | Status: DC

## 2016-03-31 NOTE — Assessment & Plan Note (Signed)
Established problem, worsening. Increasing Norvasc to 10 mg daily. Advise follow-up in one week for recheck. Offered adding additional medication as well and patient declined.

## 2016-03-31 NOTE — Progress Notes (Signed)
Pre visit review using our clinic review tool, if applicable. No additional management support is needed unless otherwise documented below in the visit note. 

## 2016-03-31 NOTE — Progress Notes (Signed)
Subjective:  Patient ID: Dustin [name], male    DOB: April 03, 1956  Age: 60 y.o. MRN: [id number]  CC: Worsening blood pressure  HPI:  60 year old male with HTN presents with complaints of elevated BP.  HTN  Patient states his blood pressure has recently been quite elevated.  He states that this started after undergoing surgery on his left shoulder and biceps.  His pressure recently has been in the [id number] to A999333 systolic.  He is currently on Norvasc 5 mg daily. He endorses compliance.  He is concerned about his blood pressure like to discuss what we can do about this.  No other associated symptoms; no chest pain, SOB, vision changes, etc.  Social Hx   Social History   Social History  . Marital Status: Divorced    Spouse Name: N/A  . Number of Children: 0  . 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 Smoking status: Former Smoker -- 1.50 packs/day for 40 years    Quit date: 04/25/2012  . Smokeless tobacco: Never Used  . Alcohol Use: Yes     Comment: occassionally  . Drug Use: No  . Sexual Activity: Not Asked   Other Topics Concern  . None   Social History Narrative             Review of Systems  Constitutional: Negative.   Cardiovascular:       Elevated BP.   Objective:  BP 176/102 mmHg  Pulse 75  Temp(Src) 98.4 F (36.9 C) (Oral)  Ht 5\' 9"  (1.753 m)  Wt 235 lb 8 oz (106.822 kg)  BMI 34.76 kg/m2  SpO2 96%  BP/Weight 03/31/2016 01/12/2016 A999333  Systolic BP 0000000 [id number]  Diastolic BP A999333 80 90  Wt. (Lbs) 235.5 236 232.5  BMI 34.76 34.84 34.32   Physical Exam  Constitutional: He is oriented to person, place, and time. He appears well-developed. No distress.  Cardiovascular: Normal rate and regular rhythm.   Pulmonary/Chest: Effort normal and breath sounds normal. He has no wheezes. He has no rales.  Neurological: He is alert and oriented to person, place, and time.  Psychiatric:  He has a normal mood and affect.  Vitals reviewed.  Lab Results  Component Value Date   WBC 8.4 12/29/2015   HGB 16.1 12/29/2015   HCT 46.3 12/29/2015   PLT 202.0 12/29/2015   GLUCOSE 90 12/29/2015   CHOL 198 12/23/2014   TRIG 154.0* 12/23/2014   HDL 44.80 12/23/2014   LDLDIRECT 152.8 11/28/2013   LDLCALC 122* 12/23/2014   ALT 25 12/29/2015   AST 20 12/29/2015   NA 137 12/29/2015   K 3.9 12/29/2015   CL 102 12/29/2015   CREATININE 1.19 12/29/2015   BUN 13 12/29/2015   CO2 27 12/29/2015   TSH 2.08 11/28/2013   PSA 1.16 12/29/2015   Assessment & Plan:   Problem List Items Addressed This Visit    Essential hypertension, benign - Primary    Established problem, worsening. Increasing Norvasc to 10 mg daily. Advise follow-up in one week for recheck. Offered adding additional medication as well and patient declined.      Relevant Medications   amLODipine (NORVASC) 10 MG tablet      Meds ordered this encounter  Medications  . amLODipine (NORVASC) 10 MG tablet    Sig: Take 1 tablet (10 mg total) by mouth daily.    Dispense:  90  tablet    Refill:  3   Follow-up: 1 week.  [place]

## 2016-03-31 NOTE — Telephone Encounter (Signed)
Left a message hoping he had not left home yet. Advised him if he had not left, to take his cuff with him to the appt.

## 2016-03-31 NOTE — Patient Instructions (Signed)
I have increase the norvasc to 10 mg daily.  Follow up in 1 week.  Take care  Dr. Lacinda Axon

## 2016-03-31 NOTE — Telephone Encounter (Signed)
That should be fine Please call him to be sure he brings his cuff to the appt so they can be sure it is accurate

## 2016-03-31 NOTE — Telephone Encounter (Signed)
For few weeks BP remaining elevated; pt has not missed BP med. Pt had surgery on shoulder and has been in pain but pain is more manageable now; earlier this morning BP 200/107 around the time pt took BP med. Now BP 160/96 lge cuff rt arm; No CP, SOB, H/A or dizziness. No available appts at [facility] and pt has appt with Dr Lacinda Axon 03/31/16 at 11:15 AM. If pt condition changes or worsens prior to appt pt will go to [facility] or ED. FYI to DR Silvio Pate.

## 2016-04-07 ENCOUNTER — Telehealth: Payer: Self-pay

## 2016-04-07 ENCOUNTER — Emergency Department
Admission: EM | Admit: 2016-04-07 | Discharge: 2016-04-07 | Disposition: A | Discharge status: Home / Self Care | Payer: BLUE CROSS/BLUE SHIELD | Attending: Emergency Medicine | Admitting: Emergency Medicine

## 2016-04-07 ENCOUNTER — Emergency Department: Payer: BLUE CROSS/BLUE SHIELD

## 2016-04-07 DIAGNOSIS — Z79899 Other long term (current) drug therapy: Secondary | ICD-10-CM | POA: Diagnosis not present

## 2016-04-07 DIAGNOSIS — I10 Essential (primary) hypertension: Secondary | ICD-10-CM | POA: Insufficient documentation

## 2016-04-07 DIAGNOSIS — Z8679 Personal history of other diseases of the circulatory system: Secondary | ICD-10-CM | POA: Diagnosis not present

## 2016-04-07 DIAGNOSIS — Z87891 Personal history of nicotine dependence: Secondary | ICD-10-CM | POA: Diagnosis not present

## 2016-04-07 DIAGNOSIS — Z86010 Personal history of colonic polyps: Secondary | ICD-10-CM | POA: Insufficient documentation

## 2016-04-07 DIAGNOSIS — Z8547 Personal history of malignant neoplasm of testis: Secondary | ICD-10-CM | POA: Insufficient documentation

## 2016-04-07 DIAGNOSIS — R42 Dizziness and giddiness: Secondary | ICD-10-CM

## 2016-04-07 LAB — CBC WITH DIFFERENTIAL/PLATELET
BASOS PCT: 1 %
Basophils Absolute: 0 10*3/uL (ref 0–0.1)
EOS ABS: 0.5 10*3/uL (ref 0–0.7)
EOS PCT: 6 %
HCT: 43.2 % (ref 40.0–52.0)
HEMOGLOBIN: 15.2 g/dL (ref 13.0–18.0)
LYMPHS ABS: 1.8 10*3/uL (ref 1.0–3.6)
Lymphocytes Relative: 23 %
MCH: 31.9 pg (ref 26.0–34.0)
MCHC: 35.3 g/dL (ref 32.0–36.0)
MCV: 90.4 fL (ref 80.0–100.0)
Monocytes Absolute: 0.5 10*3/uL (ref 0.2–1.0)
Monocytes Relative: 6 %
NEUTROS PCT: 64 %
Neutro Abs: 5 10*3/uL (ref 1.4–6.5)
PLATELETS: 191 10*3/uL (ref 150–440)
RBC: 4.77 MIL/uL (ref 4.40–5.90)
RDW: 12.9 % (ref 11.5–14.5)
WBC: 7.9 10*3/uL (ref 3.8–10.6)

## 2016-04-07 LAB — COMPREHENSIVE METABOLIC PANEL
ALBUMIN: 4.1 g/dL (ref 3.5–5.0)
ALK PHOS: 73 U/L (ref 38–126)
ALT: 22 U/L (ref 17–63)
ANION GAP: 9 (ref 5–15)
AST: 22 U/L (ref 15–41)
BUN: 16 mg/dL (ref 6–20)
CHLORIDE: 103 mmol/L (ref 101–111)
CO2: 25 mmol/L (ref 22–32)
CREATININE: 1.14 mg/dL (ref 0.61–1.24)
Calcium: 8.7 mg/dL — ABNORMAL LOW (ref 8.9–10.3)
GFR calc non Af Amer: >60 mL/min (ref >60)
GLUCOSE: 99 mg/dL (ref 65–99)
Potassium: 3.9 mmol/L (ref 3.5–5.1)
SODIUM: 137 mmol/L (ref 135–145)
Total Bilirubin: 0.3 mg/dL (ref 0.3–1.2)
Total Protein: 6.5 g/dL (ref 6.5–8.1)

## 2016-04-07 LAB — TROPONIN I: Troponin I: <0.03 ng/mL (ref <0.03)

## 2016-04-07 MED ORDER — LISINOPRIL 10 MG PO TABS: 10.0000 mg | ORAL_TABLET | Freq: Every day | ORAL | Status: DC

## 2016-04-07 MED ORDER — LISINOPRIL 10 MG PO TABS
10.0000 mg | ORAL_TABLET | Freq: Once | ORAL | Status: AC
  Administered 2016-04-07: 10 mg via ORAL
  Filled 2016-04-07: qty 1

## 2016-04-07 NOTE — Telephone Encounter (Signed)
PLEASE NOTE: All timestamps contained within this report are represented as [place] Standard Time. CONFIDENTIALTY NOTICE: This fax transmission is intended only for the addressee. It contains information that is legally privileged, confidential or otherwise protected from use or disclosure. If you are not the intended recipient, you are strictly prohibited from reviewing, disclosing, copying using or disseminating any of this information or taking any action in reliance on or regarding this information. If you have received this fax in error, please notify us immediately by telephone so that we can arrange for its return to [place]. Phone: [phone], Toll-Free: [phone], Fax: [phone] Page: 1 of 2 Call Id: [id number] [place] Patient Name: Dustin [name] Gender: Male DOB: [birth year] Age: 40 Y 2 M 4 D Return Phone Number: [phone] (Primary), [phone] (Secondary) Address: City/State/Zip: [place] [address] Client [facility] Night - Client Client Site [place] Physician Viviana Simpler - MD Contact Type Call Who Is Calling Patient / Member / Family / Caregiver Call Type Triage / Clinical Relationship To Patient Self Return Phone Number [phone] (Primary) Chief Complaint BLOOD PRESSURE HIGH - Systolic (top number) A999333 or greater (with symptoms) Reason for Call Symptomatic / Request for [place] states that BP=205/105, he has taken 4 baby aspirin he is feeling anxious, nausea. PreDisposition Call Doctor Translation No Nurse Assessment Nurse: Joya Gaskins, RN, Vonna Kotyk Date/Time Eilene Ghazi Time): 04/07/2016 4:32:20 PM Confirm and document reason for call. If symptomatic, describe symptoms. You must click the next button to save text entered. ---Caller states that BP=205/105, he has taken 4 baby aspirin  he is feeling anxious, nausea. Caller states that he has been like this for over a week. He states that he was told to double his norvasc last Friday. Caller states that he has an appointment with MD this Friday. Has the patient traveled out of the country within the last 30 days? ---No Does the patient have any new or worsening symptoms? ---Yes Will a triage be completed? ---Yes Related visit to physician within the last 2 weeks? ---Yes Does the PT have any chronic conditions? (i.e. diabetes, asthma, etc.) ---Yes List chronic conditions. ---HTN, shoulder surgery 3 weeks ago Is this a behavioral health or substance abuse call? ---No Guidelines Guideline Title Affirmed Question Affirmed Notes Nurse Date/Time (Eastern Time) High Blood Pressure [id number] Systolic BP >= 0000000 OR Diastolic >= 123XX123 AND A999333 cardiac or neurologic symptoms (e.g., chest pain, difficulty breathing, unsteady gait, blurred vision) Joya Gaskins, RN, Vonna Kotyk 04/07/2016 4:34:30 PM PLEASE NOTE: All timestamps contained within this report are represented as [place] Standard Time. CONFIDENTIALTY NOTICE: This fax transmission is intended only for the addressee. It contains information that is legally privileged, confidential or otherwise protected from use or disclosure. If you are not the intended recipient, you are strictly prohibited from reviewing, disclosing, copying using or disseminating any of this information or taking any action in reliance on or regarding this information. If you have received this fax in error, please notify us immediately by telephone so that we can arrange for its return to [place]. Phone: [phone], Toll-Free: [phone], Fax: [phone] Page: 2 of 2 Call Id: [id number] [place]. Time Eilene Ghazi Time) Disposition Final User 04/07/2016 4:30:22 PM Send to Urgent Queue [place] 04/07/2016 4:37:06 PM Go to ED Now Yes Joya Gaskins, RN, Aviva Kluver Understands: Yes Disagree/Comply: Comply Care Advice Given Per  Guideline GO TO ED NOW: You need  to be seen in the Emergency Department. Go to the ER at ___________ [place] now. Drive carefully. NOTE TO TRIAGER - DRIVING: CALL EMS [id number] IF: CARE ADVICE given per High Blood Pressure (Adult) guideline. Referrals GO TO FACILITY UNDECIDED

## 2016-04-07 NOTE — Telephone Encounter (Signed)
I spoke with pt and he is at [facility] ED now,.

## 2016-04-07 NOTE — ED Notes (Addendum)
Pt arrives to ER via POV c/o hypertension X 3 weeks; dizziness, blurred vision and tingling of right hand X 1 week. Denies CP. Pt alert and oriented X4, active, cooperative, pt in NAD. RR even and unlabored, color WNL.    PT had left sided shoulder surgery and repair; wearing brace upon arrival- only complaint of pain is in shoulder on left side.  Pt took 4 baby ASA PTA  Pt was instructed to take 10mg  Norvasc for last week, no affect in blood pressure per patient.

## 2016-04-07 NOTE — Discharge Instructions (Signed)
Continue norvasc 10 mg daily.   Add lisinopril 10 mg daily.   See Dr. Silvio Pate on Friday as scheduled to recheck your blood pressure.   Return to ER if you have worse headaches, dizziness, vomiting, blurry vision, fevers, passing out.

## 2016-04-07 NOTE — ED Provider Notes (Signed)
CSN: [id number]     Arrival date & time 04/07/16  1658 History   First MD Initiated Contact with Patient 04/07/16 1717     Chief Complaint  Patient presents with  . Hypertension     (Consider location/radiation/quality/duration/timing/severity/associated sxs/prior Treatment) The history is provided by the patient.  Dustin [name] is a 60 y.o. 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 Patient has been having intermittent blurry vision and dizziness for the last 3 weeks. States that whenever he is blood pressure is elevated he just feels dizzy and unwell. States that his blood pressure has been running between [id number] to [id number] systolic. Denies any chest pain or shortness of breath. Of note, about 3 weeks ago he had left bicep tendon repair and rotator cuff surgery and is currently in a brace. He's been weaning himself off of the pain medicine has been in pain more. He called his doctor a week ago and was told to increase norvasc from 5 mg to 10 mg daily but his blood pressure remains elevated. Denies abdominal pain, weakness. Does have right elbow pain but denies injury. Denies arm weakness, just pain when he try to lift up heavy items. He has been using right elbow more in the past few weeks since the left shoulder surgery. On his chart, there was cerebral aneurysm. He had a questionable aneurysm on MRI in 2004 but had MRI since then that showed no aneurysm. Has family hx of cerebral aneurysm but actually was told that he has no aneurysms.     Past Medical History  Diagnosis Date  . History of colonic polyps   . Hypertension   . Cerebral aneurysm   . 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 Inguinal hernia repair  child    bilateral  . Tonsillectomy    . Right testicle removed  1978  . Abdominal exploration surgery  1978  .  Prolactinoma excision  09/04    [name]  . Esophagogastroduodenoscopy  03/08     and colon  . Appendectomy      at same time as lymph node dissection  . Stress nuclear  negative    10/09  . Mohs surgery Left nose    at [facility]--- 2013   Family History  Problem Relation Age of Onset  . Cancer Brother     melanoma  . Cancer Paternal Aunt     lung  . Cancer Other     strong in family   Social History  Substance Use Topics  . Smoking status: Former Smoker -- 1.50 packs/day for 40 years    Quit date: 04/25/2012  . Smokeless tobacco: Never Used  . Alcohol Use: Yes     Comment: occassionally    Review of Systems  Neurological: Positive for dizziness.  All other systems reviewed and are negative.     Allergies  Omeprazole-sodium bicarbonate and Varenicline tartrate  Home Medications   Prior to Admission medications   Medication Sig Start Date End Date Taking? Authorizing Provider  amLODipine (NORVASC) 10 MG tablet Take 1 tablet (10 mg total) by mouth daily. 03/31/16   Barnie Del Cook, DO  budesonide (RHINOCORT ALLERGY) 32 MCG/ACT nasal spray Place 2 sprays into both nostrils daily. Reported on 03/31/2016    Historical Provider, MD  Loratadine 10 MG CAPS Take 10 mg by mouth  daily. Reported on 03/31/2016    Historical Provider, MD  naproxen sodium (ALEVE) 220 MG tablet Take 220 mg by mouth 2 (two) times daily with a meal. Reported on 03/31/2016    Historical Provider, MD  ranitidine (ZANTAC) 75 MG tablet Take 75 mg by mouth 2 (two) times daily. Reported on 03/31/2016    Historical Provider, MD  VIAGRA 100 MG tablet TAKE 1 TABLET BY MOUTH AS NEEDED FOR ERECTILE DYSFUNCTION 05/15/15   Venia Carbon, MD   BP 151/94 mmHg  Pulse 85  Temp(Src) 98.2 F (36.8 C) (Oral)  Resp 14  Wt 235 lb (106.595 kg)  SpO2 96% Physical Exam  Constitutional: He is oriented to person, place, and time. He appears well-developed and well-nourished.  HENT:  Head: Normocephalic.  Mouth/Throat:  Oropharynx is clear and moist.  Eyes: Conjunctivae and EOM are normal. Pupils are equal, round, and reactive to light.  No nystagmus   Neck: Normal range of motion. Neck supple.  Cardiovascular: Normal rate, regular rhythm and normal heart sounds.   Pulmonary/Chest: Effort normal and breath sounds normal. No respiratory distress. He has no wheezes. He has no rales.  Abdominal: Soft. Bowel sounds are normal. He exhibits no distension. There is no tenderness. There is no rebound.  Musculoskeletal: Normal range of motion.  L shoulder brace on. R elbow sore but no bony tenderness, no effusion, nl ROM.   Neurological: He is alert and oriented to person, place, and time.  CN 2-12 intact, nl strength throughout (unable to move the left shoulder due to recent surgery).   Skin: Skin is warm.  Psychiatric: He has a normal mood and affect. His behavior is normal. Judgment and thought content normal.  Nursing note and vitals reviewed.   ED Course  Procedures (including critical care time) Labs Review Labs Reviewed  COMPREHENSIVE METABOLIC PANEL - Abnormal; Notable for the following:    Calcium 8.7 (*)    All other components within normal limits  CBC WITH DIFFERENTIAL/PLATELET  TROPONIN I  TROPONIN I    Imaging Review Ct Head Wo Contrast  04/07/2016  CLINICAL DATA:  Hypertension for 3 weeks with dizziness, blurred vision and right hand tingling over the last week. EXAM: CT HEAD WITHOUT CONTRAST TECHNIQUE: Contiguous axial images were obtained from the base of the skull through the vertex without intravenous contrast. COMPARISON:  Head CT 07/18/2014.  MRI brain 12/03/2010 FINDINGS: Brain: There is no evidence of acute intracranial hemorrhage, mass lesion, brain edema or extra-axial fluid collection. The ventricles and subarachnoid spaces are appropriately sized for age. There is no CT evidence of acute cortical infarction. Bones/sinuses/visualized face: Mild ethmoid sinus mucosal thickening has  improved compared with the prior study. The visualized paranasal sinuses, mastoid air cells and middle ears are otherwise clear. The calvarium is intact. IMPRESSION: No acute intracranial findings. Chronic ethmoid sinus mucosal thickening, improved from previous study of 2015. Electronically Signed   By: Richardean Sale M.D.   On: 04/07/2016 18:53   I have personally reviewed and evaluated these images and lab results as part of my medical decision-making.   EKG Interpretation None       ED ECG REPORT I, YAO, DAVID, the attending physician, personally viewed and interpreted this ECG.   Date: 04/07/2016  EKG Time: 18:09  Rate: 87  Rhythm: normal EKG, normal sinus rhythm  Axis: normal  Intervals:none  ST&T Change: none   MDM   Final diagnoses:  None    Dustin [name] is a  60 y.o. male here with hypertension, dizziness for a week. Hypertensive 176/102 on arrival. Well appearing, nl neuro exam. On the chart, there was hx of cerebral aneurysm but had MRI recently that showed no aneurysm but the diagnosis was not removed from the chart. Will get labs, CT head to look for bleed. Low suspicion for [facility] and won't need LP if CT neg. Likely symptomatic hypertension so will start lisinopril in addition to Norvasc.   7:36 PM BP improved to 150s now. CT head nl. Labs at baseline. Has PCP follow up in 2 days. Will dc with lisinopril 10 mg daily.     Wandra Arthurs, MD 04/07/16 [phone]

## 2016-04-08 NOTE — Telephone Encounter (Signed)
Will await the results of that evaluation

## 2016-04-09 ENCOUNTER — Encounter: Payer: Self-pay | Admitting: Internal Medicine

## 2016-04-09 ENCOUNTER — Ambulatory Visit (INDEPENDENT_AMBULATORY_CARE_PROVIDER_SITE_OTHER): Payer: BLUE CROSS/BLUE SHIELD | Admitting: Internal Medicine

## 2016-04-09 VITALS — BP 132/78 | HR 84 | Temp 98.2°F | Wt 239.0 lb

## 2016-04-09 DIAGNOSIS — I10 Essential (primary) hypertension: Secondary | ICD-10-CM

## 2016-04-09 MED ORDER — LISINOPRIL 10 MG PO TABS: 10.0000 mg | ORAL_TABLET | Freq: Every day | ORAL | Status: DC

## 2016-04-09 NOTE — Assessment & Plan Note (Signed)
BP Readings from Last 3 Encounters:  04/09/16 132/78  04/07/16 179/95  03/31/16 176/102   Better now Some of this may be related to pain

## 2016-04-09 NOTE — Progress Notes (Signed)
Subjective:    Patient ID: Dustin [name], male    DOB: 01/07/1956, 60 y.o.   MRN: [id number]  HPI Here for follow up of HTN  "I have been having fits" BP skyrocketing Reviewed ER evaluation Right shoulder surgery--weaning off the oxcodone Has gotten some nausea yesterday-- worried about withdrawal Pain in shoulder is better than before the surgery  BP was 174/98 before surgery--- this was 6/5 Amlodipine doubled at visit last week At ER lisinopril added Tolerating this so far  Current Outpatient Prescriptions on File Prior to Visit  Medication Sig Dispense Refill  . amLODipine (NORVASC) 10 MG tablet Take 1 tablet (10 mg total) by mouth daily. 90 tablet 3  . budesonide (RHINOCORT ALLERGY) 32 MCG/ACT nasal spray Place 2 sprays into both nostrils daily. Reported on 03/31/2016    . lisinopril (PRINIVIL,ZESTRIL) 10 MG tablet Take 1 tablet (10 mg total) by mouth daily. 30 tablet 0  . Loratadine 10 MG CAPS Take 10 mg by mouth daily. Reported on 03/31/2016    . ranitidine (ZANTAC) 75 MG tablet Take 75 mg by mouth 2 (two) times daily. Reported on 03/31/2016    . VIAGRA 100 MG tablet TAKE 1 TABLET BY MOUTH AS NEEDED FOR ERECTILE DYSFUNCTION 6 tablet 11   No current facility-administered medications on file prior to visit.    Allergies  Allergen Reactions  . Omeprazole-Sodium Bicarbonate     REACTION: Didn't feel good  . Varenicline Tartrate     Past Medical History  Diagnosis Date  . History of colonic polyps   . Hypertension   . Cerebral aneurysm   . 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 Inguinal hernia repair  child    bilateral  . Tonsillectomy    . Right testicle removed  1978  . Abdominal exploration surgery  1978  . Prolactinoma excision  09/04    [name]  . Esophagogastroduodenoscopy  03/08     and colon  . Appendectomy      at same time as lymph node dissection  .  Stress nuclear  negative    10/09  . Mohs surgery Left nose    at [facility]--- 2013    Family History  Problem Relation Age of Onset  . Cancer Brother     melanoma  . Cancer Paternal Aunt     lung  . Cancer Other     strong in family    Social History   Social History  . Marital Status: Divorced    Spouse Name: N/A  . Number of Children: 0  . 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 Smoking status: Former Smoker -- 1.50 packs/day for 40 years    Quit date: 04/25/2012  . Smokeless tobacco: Never Used  . Alcohol Use: Yes     Comment: occassionally  . Drug Use: No  . Sexual Activity: Not on file   Other Topics Concern  . Not on file   Social History Narrative             Review of Systems Now able to sleep Appetite is okay No cough or throat symptoms    Objective:   Physical Exam  Constitutional: He appears well-developed and well-nourished. No distress.  Neck: Normal range of motion. Neck supple.  Cardiovascular: Normal rate, regular  rhythm and normal heart sounds.  Exam reveals no gallop.   No murmur heard. Pulmonary/Chest: Effort normal and breath sounds normal. No respiratory distress. He has no wheezes. He has no rales.  Musculoskeletal: He exhibits no edema.  Lymphadenopathy:    He has no cervical adenopathy.          Assessment & Plan:

## 2016-04-09 NOTE — Progress Notes (Signed)
Pre visit review using our clinic review tool, if applicable. No additional management support is needed unless otherwise documented below in the visit note. 

## 2016-04-19 ENCOUNTER — Encounter: Payer: Self-pay | Admitting: Internal Medicine

## 2016-04-19 ENCOUNTER — Ambulatory Visit (INDEPENDENT_AMBULATORY_CARE_PROVIDER_SITE_OTHER): Payer: BLUE CROSS/BLUE SHIELD | Admitting: Internal Medicine

## 2016-04-19 VITALS — BP 138/82 | HR 83 | Temp 98.3°F | Wt 235.0 lb

## 2016-04-19 DIAGNOSIS — M79601 Pain in right arm: Secondary | ICD-10-CM

## 2016-04-19 NOTE — Assessment & Plan Note (Signed)
Discussed that this doesn't seem to have anything to do with the tick bites Seems to be related to his shoulder Okay to try the aleve again (but monitor blood pressure)

## 2016-04-19 NOTE — Progress Notes (Signed)
Pre visit review using our clinic review tool, if applicable. No additional management support is needed unless otherwise documented below in the visit note. 

## 2016-04-19 NOTE — Progress Notes (Signed)
Subjective:    Patient ID: Dustin [name], male    DOB: [birth year], 60 y.o.   MRN: [id number]  HPI He is here due to concern about weakness in right arm Will have pain and trouble lifting things Sore and achy Several tick bites Pain in elbow---inflammation now seems better  Has had some embedded and on for over a day he suspects No fever Some chills No rash Right shoulder and knee pain--no real change  Had been using lots of aleve before surgery---has shied away from this Ibuprofen doesn't help  Current Outpatient Prescriptions on File Prior to Visit  Medication Sig Dispense Refill  . amLODipine (NORVASC) 10 MG tablet Take 1 tablet (10 mg total) by mouth daily. 90 tablet 3  . budesonide (RHINOCORT ALLERGY) 32 MCG/ACT nasal spray Place 2 sprays into both nostrils daily. Reported on 03/31/2016    . lisinopril (PRINIVIL,ZESTRIL) 10 MG tablet Take 1 tablet (10 mg total) by mouth daily. 90 tablet 3  . Loratadine 10 MG CAPS Take 10 mg by mouth daily. Reported on 03/31/2016    . ranitidine (ZANTAC) 75 MG tablet Take 75 mg by mouth 2 (two) times daily. Reported on 03/31/2016    . VIAGRA 100 MG tablet TAKE 1 TABLET BY MOUTH AS NEEDED FOR ERECTILE DYSFUNCTION 6 tablet 11   No current facility-administered medications on file prior to visit.    Allergies  Allergen Reactions  . Omeprazole-Sodium Bicarbonate     REACTION: Didn't feel good  . Varenicline Tartrate     Past Medical History  Diagnosis Date  . History of colonic polyps   . Hypertension   . Cerebral aneurysm   . 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 Inguinal hernia repair  child    bilateral  . Tonsillectomy    . Right testicle removed  1978  . Abdominal exploration surgery  1978  . Prolactinoma excision  09/04    [name]  . Esophagogastroduodenoscopy  03/08     and colon  . Appendectomy      at same time as lymph  node dissection  . Stress nuclear  negative    10/09  . Mohs surgery Left nose    at [facility]--- 2013  . Shoulder open rotator cuff repair Left 6/17    and biceps repair--[facility]    Family History  Problem Relation Age of Onset  . Cancer Brother     melanoma  . Cancer Paternal Aunt     lung  . Cancer Other     strong in family    Social History   Social History  . Marital Status: Divorced    Spouse Name: N/A  . Number of Children: 0  . 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 Smoking status: Former Smoker -- 1.50 packs/day for 40 years    Quit date: 04/25/2012  . Smokeless tobacco: Never Used  . Alcohol Use: Yes     Comment: occassionally  . Drug Use: No  . Sexual Activity: Not on file   Other Topics Concern  . Not on file   Social History Narrative             Review of Systems  No nausea or vomiting Appetite is okay     Objective:   Physical Exam  Musculoskeletal:  No tenderness in right shoulder Some decreased abduction actively (at horizontal) and external rotation Right elbow normal  Neurological:  No right arm weakness  Skin: No rash noted.  No worrisome skin findings--even at places where tick bites were          Assessment & Plan:

## 2016-05-24 ENCOUNTER — Other Ambulatory Visit: Payer: Self-pay | Admitting: Internal Medicine

## 2016-05-25 ENCOUNTER — Other Ambulatory Visit: Payer: Self-pay | Admitting: *Deleted

## 2016-05-25 NOTE — Telephone Encounter (Signed)
Refill sent to pharmacy electronically as instructed.

## 2016-05-25 NOTE — Telephone Encounter (Signed)
Received refill request from pharmacy electronically Medication was not on current list Added from outside source/[facility] Last refill 5/16/6 #60/11 refills Shows discontinued on medication history Last office visit 04/19/16 Okay to refill as requested from pharmacy?

## 2016-05-25 NOTE — Telephone Encounter (Signed)
Approved: okay to refill for a year 

## 2016-06-28 ENCOUNTER — Encounter: Payer: Self-pay | Admitting: Internal Medicine

## 2016-06-28 ENCOUNTER — Ambulatory Visit (INDEPENDENT_AMBULATORY_CARE_PROVIDER_SITE_OTHER): Payer: BLUE CROSS/BLUE SHIELD | Admitting: Internal Medicine

## 2016-06-28 VITALS — BP 122/78 | HR 85 | Temp 98.2°F | Wt 226.0 lb

## 2016-06-28 DIAGNOSIS — I10 Essential (primary) hypertension: Secondary | ICD-10-CM

## 2016-06-28 DIAGNOSIS — IMO0001 Reserved for inherently not codable concepts without codable children: Secondary | ICD-10-CM

## 2016-06-28 DIAGNOSIS — Z8249 Family history of ischemic heart disease and other diseases of the circulatory system: Secondary | ICD-10-CM

## 2016-06-28 NOTE — Patient Instructions (Signed)
Please stop the lisinopril. Continue the amlodipine daily. Let me know if your blood pressure is regularly over 150/90

## 2016-06-28 NOTE — Assessment & Plan Note (Addendum)
He was smoker Dad died of AAA at 72 Past vascular disease---will check ultrasound Reviewed imaging--had negative CT 2012 reassured

## 2016-06-28 NOTE — Progress Notes (Signed)
Pre visit review using our clinic review tool, if applicable. No additional management support is needed unless otherwise documented below in the visit note. 

## 2016-06-28 NOTE — Assessment & Plan Note (Signed)
BP Readings from Last 3 Encounters:  06/28/16 122/78  04/19/16 138/82  04/09/16 132/78   Improved lifestyle Intolerable side effects with lisinopril --will try off Continue amlodipine

## 2016-06-28 NOTE — Progress Notes (Signed)
Subjective:    Patient ID: Dustin [name], male    DOB: September 15, 1956, 60 y.o.   MRN: [id number]  HPI Here for follow up of HTN  He has worked hard on healthy lifestyle Taking vitamin regimen and supplements Has lost 9#--exercising regularly BP running great  125/75---166/80 (he thinks it was up after stimulant) He has checked his BP machine here and it correlates  Very unhappy about the lisinopril Has caused libido decrease and ED Cut back on the amlodipine to every other day  Concerned about abdominal symptoms Dad died of AAA Was smoker in past  No chest pain No SOB Feels he is getting into better shape  Current Outpatient Prescriptions on File Prior to Visit  Medication Sig Dispense Refill  . amLODipine (NORVASC) 10 MG tablet Take 1 tablet (10 mg total) by mouth daily. 90 tablet 3  . budesonide (RHINOCORT ALLERGY) 32 MCG/ACT nasal spray Place 2 sprays into both nostrils daily. Reported on 03/31/2016    . buPROPion (WELLBUTRIN SR) 150 MG 12 hr tablet 150 mg daily.     Marland Kitchen VIAGRA 100 MG tablet TAKE 1 TABLET BY MOUTH AS NEEDED FOR ERECTILE DYSFUNCTION 6 tablet 11   No current facility-administered medications on file prior to visit.     Allergies  Allergen Reactions  . Omeprazole-Sodium Bicarbonate     REACTION: Didn't feel good  . Varenicline Tartrate     Past Medical History:  Diagnosis Date  . Cerebral aneurysm   . History of colonic polyps   . Hypertension   . 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 Testicular cancer [facility]) 1978   teratocarcinoma    Past Surgical History:  Procedure Laterality Date  . ABDOMINAL EXPLORATION SURGERY  1978  . APPENDECTOMY     at same time as lymph node dissection  . ESOPHAGOGASTRODUODENOSCOPY  03/08    and colon  . INGUINAL HERNIA REPAIR  child   bilateral  . MOHS SURGERY Left nose   at [facility]--- 2013  . prolactinoma excision  09/04   [name]  . right testicle removed  1978  . SHOULDER OPEN ROTATOR CUFF REPAIR Left 6/17   and biceps repair--[facility]  . stress nuclear  negative   10/09  . TONSILLECTOMY      Family History  Problem Relation Age of Onset  . Cancer Brother     melanoma  . Cancer Paternal Aunt     lung  . Cancer Other     strong in family    Social History   Social History  . Marital status: Divorced    Spouse name: N/A  . Number of children: 0  . Years of education: N/A   Occupational History  . 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 Smoking status: Former Smoker    Packs/day: 1.50    Years: 40.00    Quit date: 04/25/2012  . Smokeless tobacco: Never Used  . Alcohol use Yes     Comment: occassionally  . Drug use: No  . Sexual activity: Not on file   Other Topics Concern  . Not on file   Social History Narrative             Review of Systems Some mild dizziness Sleeps well Pain in shoulder has finally resolved    Objective:   Physical Exam  Constitutional: He appears well-developed and well-nourished. No distress.  Neck: Normal range of motion.  Cardiovascular: Normal rate and normal heart sounds.  Exam reveals no gallop.   No murmur heard. Pulmonary/Chest: Effort normal and breath sounds normal. No respiratory distress. He has no wheezes. He has no rales.  Abdominal: Soft. He exhibits no distension and no mass. There is no tenderness. There is no rebound and no guarding.  Musculoskeletal: He exhibits no edema.  Lymphadenopathy:    He has no cervical adenopathy.          Assessment & Plan:

## 2016-09-13 ENCOUNTER — Ambulatory Visit (INDEPENDENT_AMBULATORY_CARE_PROVIDER_SITE_OTHER): Payer: BLUE CROSS/BLUE SHIELD | Admitting: Internal Medicine

## 2016-09-13 ENCOUNTER — Encounter: Payer: Self-pay | Admitting: Internal Medicine

## 2016-09-13 VITALS — BP 124/80 | HR 85 | Temp 98.4°F | Ht 69.0 in | Wt 229.8 lb

## 2016-09-13 DIAGNOSIS — K649 Unspecified hemorrhoids: Secondary | ICD-10-CM | POA: Insufficient documentation

## 2016-09-13 DIAGNOSIS — K644 Residual hemorrhoidal skin tags: Secondary | ICD-10-CM | POA: Diagnosis not present

## 2016-09-13 DIAGNOSIS — I10 Essential (primary) hypertension: Secondary | ICD-10-CM

## 2016-09-13 MED ORDER — LOSARTAN POTASSIUM 50 MG PO TABS: 50.0000 mg | ORAL_TABLET | Freq: Every day | ORAL | 3 refills | Status: DC

## 2016-09-13 NOTE — Assessment & Plan Note (Signed)
Has not been doing well off the lisinopril--but doesn't like it Will try changing to losartan

## 2016-09-13 NOTE — Patient Instructions (Signed)
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

## 2016-09-13 NOTE — Assessment & Plan Note (Signed)
Will try cortisone cream

## 2016-09-13 NOTE — Progress Notes (Signed)
Pre visit review using our clinic review tool, if applicable. No additional management support is needed unless otherwise documented below in the visit note. 

## 2016-09-13 NOTE — Progress Notes (Signed)
Subjective:    Patient ID: Dustin [name], male    DOB: 1956-04-18, 60 y.o.   MRN: [id number]  HPI Here for follow up of HTN  He did try stopping the lisinopril Went back on due to BP staying up [place]  He thought it might be causing some leg swelling (was also taking aleve) Then went up as high as 177/110 before trip BP 150/78, 153/83 today (177/88 before meds this AM) Still feels this cuts his libido (and some ED)  Having pain and bleeding when wiping after moving bowels  Numbness in left thigh While standing especially Doesn't improve with walking Lots of plane travel He shows classic presentation--meralgia paresthetica  Current Outpatient Prescriptions on File Prior to Visit  Medication Sig Dispense Refill  . amLODipine (NORVASC) 10 MG tablet Take 1 tablet (10 mg total) by mouth daily. 90 tablet 3  . budesonide (RHINOCORT ALLERGY) 32 MCG/ACT nasal spray Place 2 sprays into both nostrils daily. Reported on 03/31/2016    . buPROPion (WELLBUTRIN SR) 150 MG 12 hr tablet 150 mg daily.     Marland Kitchen VIAGRA 100 MG tablet TAKE 1 TABLET BY MOUTH AS NEEDED FOR ERECTILE DYSFUNCTION 6 tablet 11   No current facility-administered medications on file prior to visit.     Allergies  Allergen Reactions  . Omeprazole-Sodium Bicarbonate     REACTION: Didn't feel good  . Varenicline Tartrate     Past Medical History:  Diagnosis Date  . Cerebral aneurysm   . History of colonic polyps   . Hypertension   . 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 ABDOMINAL EXPLORATION SURGERY  1978  . APPENDECTOMY     at same time as lymph node dissection  . ESOPHAGOGASTRODUODENOSCOPY  03/08    and colon  . INGUINAL HERNIA REPAIR  child   bilateral  . MOHS SURGERY Left nose   at [facility]--- 2013  . prolactinoma excision  09/04   [name]  .  right testicle removed  1978  . SHOULDER OPEN ROTATOR CUFF REPAIR Left 6/17   and biceps repair--[facility]  . stress nuclear  negative   10/09  . TONSILLECTOMY      Family History  Problem Relation Age of Onset  . Cancer Brother     melanoma  . Cancer Paternal Aunt     lung  . Cancer Other     strong in family    Social History   Social History  . Marital status: Divorced    Spouse name: N/A  . Number of children: 0  . Years of education: N/A   Occupational History  . 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 Smoking status: Former Smoker    Packs/day: 1.50    Years: 40.00    Quit date: 04/25/2012  . Smokeless tobacco: Never Used  . Alcohol use Yes     Comment: occassionally  . Drug use: No  . Sexual activity: Not on file   Other Topics Concern  . Not on file   Social History Narrative             Review of Systems Gets muscle feeling like they are getting ready to cramp--but they don't Sleeping okay--up to void 2-3 times Appetite is fine Ongoing stomach issues--pain in mid abdomen    Objective:  Physical Exam  Constitutional: No distress.  Neck: Normal range of motion. Neck supple. No thyromegaly present.  Cardiovascular: Normal rate, regular rhythm and normal heart sounds.  Exam reveals no gallop.   No murmur heard. Pulmonary/Chest: Effort normal and breath sounds normal. No respiratory distress. He has no wheezes. He has no rales.  Genitourinary:  Genitourinary Comments: Small posterior hemorrhoid Rectal negative Stool heme negative  Lymphadenopathy:    He has no cervical adenopathy.          Assessment & Plan:

## 2016-11-17 ENCOUNTER — Encounter: Payer: Self-pay | Admitting: Internal Medicine

## 2016-11-17 ENCOUNTER — Ambulatory Visit (INDEPENDENT_AMBULATORY_CARE_PROVIDER_SITE_OTHER): Payer: BLUE CROSS/BLUE SHIELD | Admitting: Internal Medicine

## 2016-11-17 VITALS — BP 148/90 | HR 92 | Resp 16 | Wt 231.0 lb

## 2016-11-17 DIAGNOSIS — M130 Polyarthritis, unspecified: Secondary | ICD-10-CM | POA: Diagnosis not present

## 2016-11-17 DIAGNOSIS — N529 Male erectile dysfunction, unspecified: Secondary | ICD-10-CM | POA: Diagnosis not present

## 2016-11-17 DIAGNOSIS — Z87891 Personal history of nicotine dependence: Secondary | ICD-10-CM

## 2016-11-17 DIAGNOSIS — Z8249 Family history of ischemic heart disease and other diseases of the circulatory system: Secondary | ICD-10-CM

## 2016-11-17 DIAGNOSIS — R601 Generalized edema: Secondary | ICD-10-CM | POA: Diagnosis not present

## 2016-11-17 DIAGNOSIS — I10 Essential (primary) hypertension: Secondary | ICD-10-CM | POA: Diagnosis not present

## 2016-11-17 DIAGNOSIS — E785 Hyperlipidemia, unspecified: Secondary | ICD-10-CM

## 2016-11-17 MED ORDER — AMLODIPINE BESYLATE 5 MG PO TABS: 5.0000 mg | ORAL_TABLET | Freq: Every day | ORAL | 6 refills | Status: DC

## 2016-11-17 MED ORDER — FUROSEMIDE 20 MG PO TABS: 20.0000 mg | ORAL_TABLET | Freq: Every day | ORAL | 3 refills | Status: DC

## 2016-11-17 NOTE — Progress Notes (Signed)
Pre visit review using our clinic review tool, if applicable. No additional management support is needed unless otherwise documented below in the visit note. 

## 2016-11-17 NOTE — Patient Instructions (Addendum)
Reduce the amlodipine to 5 mg daily   Resume the losartan   Starting furosemide 20 mg  every other day ,  Ok to use it daily for a maximum of 3 days when the fluid is  Really a problem    Vascular evaluation under way  To see if lymphedema is the cause of the edema    fasting labs this week including testosterone   ECHO  to see if diastolic dysfunction  Is contributing to fluid retention

## 2016-11-17 NOTE — Progress Notes (Signed)
Subjective:  Patient ID: Dustin [name], male    DOB: 10-05-1956  Age: 61 y.o. MRN: [id number]  CC: The primary encounter diagnosis was Generalized edema. Diagnoses of Essential hypertension, Polyarthritis, Erectile dysfunction, unspecified erectile dysfunction type, Family history of dissecting aortic aneurysm, Essential hypertension, benign, Hyperlipidemia LDL goal <100, and History of tobacco abuse were also pertinent to this visit.  HPI Dustin [name] presents for evaluation of multiple complaints, including recurrent lower extremity pitting edema,  Polyarthritis and And left anterior thigh numbness    History of heavy NSAID use prior to left rotator cuff repair, bicpes tenodesis and debridement of humeral head and subacromial space with lipogems injection that was done by Melvern Sample at [facility]  May 2017 , has reduced use from 6 Aleve to 2 daily and has resumed pleasure sports activities. .    He notes Pitting edema occurring daily,  And notes on several occasions that the edema extends tup upper thighs . "I feel like I'm wallking in mud." .  Legs feel heavy all the time.  Has nocturia x 4 at night .  Taking amlodipine 10 mg daily for control of blood pressure and 1-2 aleve daily.    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    varicose veins . History of laser ablation of the right greater saphenous vein by a a vascular group in [facility] 2017  S/p inguinal hernia repair, appendectomy, and stem cell transplant    S/p excision of Pituitary adenoma by Maceo Pro at [facility]  2004  . Used subQ fat from abdominal wall to pack nose.   Occupational and recreational travel frequently, does not wear compression stockings on a regular basis but tried them recently due to edema, not  fitting well.   current travel schedule leaves Friday and returns  next Wednesday.    2) polyarthritis .  Knees, elbows shoulders,  Mother had RA   3) Left anterior thigh goes numb,  Then starts to hurt.  Denies back pain.  Wears a belt every day. Has a protuberant abdomen, due in part to ventral hernia and  (Body mass index is 34.11 kg/m.Does not wear elastic waist bands or suspenders.  Outpatient Medications Prior to Visit  Medication Sig Dispense Refill  . budesonide (RHINOCORT ALLERGY) 32 MCG/ACT nasal spray Place 2 sprays into both nostrils daily. Reported on 03/31/2016    . buPROPion (WELLBUTRIN SR) 150 MG 12 hr tablet 150 mg daily.     Marland Kitchen VIAGRA 100 MG tablet TAKE 1 TABLET BY MOUTH AS NEEDED FOR ERECTILE DYSFUNCTION 6 tablet 11  . amLODipine (NORVASC) 10 MG tablet Take 1 tablet (10 mg total) by mouth daily. 90 tablet 3  . losartan (COZAAR) 50 MG tablet Take 1 tablet (50 mg total) by mouth daily. (Patient not taking: Reported on 11/17/2016) 90 tablet 3   No facility-administered medications prior to visit.     Review of Systems;  Patient denies headache, fevers, malaise, unintentional weight loss, skin rash, eye pain, sinus congestion and sinus pain, sore throat, dysphagia,  hemoptysis , cough, dyspnea, wheezing, chest pain, palpitations, orthopnea, , abdominal pain, nausea, melena, diarrhea, constipation, flank pain, dysuria, hematuria, urinary  Frequency, , tingling, seizures,  Focal weakness, Loss of consciousness,  Tremor, insomnia, depression,  anxiety, and suicidal ideation.      Objective:  BP (!) 148/90   Pulse 92   Resp 16   Wt 231 lb (104.8 kg)   SpO2 96%   BMI 34.11 kg/m   BP Readings from Last 3 Encounters:  11/17/16 (!) 148/90  09/13/16 124/80  06/28/16 122/78    Wt Readings from Last 3 Encounters:  11/17/16 231 lb (104.8 kg)  09/13/16 229 lb 12 oz (104.2 kg)  06/28/16 226 lb (102.5 kg)    General appearance: alert, cooperative and appears stated  age Ears: normal TM's and external ear canals both ears Throat: lips, mucosa, and tongue normal; teeth and gums normal Neck: no adenopathy, no carotid bruit, supple, symmetrical, trachea midline and thyroid not enlarged, symmetric, no tenderness/mass/nodules Back: symmetric, no curvature. ROM normal. No CVA tenderness. Lungs: clear to auscultation bilaterally Heart: regular rate and rhythm, S1, S2 normal, no murmur, click, rub or gallop Abdomen: soft, non-tender; bowel sounds normal; no masses,  no organomegaly Pulses: 2+ and symmetric Skin: varicose veins,  1+ pitting edema bilateral calves.  No rashes or lesions Lymph nodes: Cervical, supraclavicular, and axillary nodes normal.  No results found for: HGBA1C  Lab Results  Component Value Date   CREATININE 1.14 04/07/2016   CREATININE 1.19 12/29/2015   CREATININE 1.21 12/23/2014    Lab Results  Component Value Date   WBC 7.9 04/07/2016   HGB 15.2 04/07/2016   HCT 43.2 04/07/2016   PLT 191 04/07/2016   GLUCOSE 99 04/07/2016   CHOL 198 12/23/2014   TRIG 154.0 (H) 12/23/2014   HDL 44.80 12/23/2014   LDLDIRECT 152.8 11/28/2013   LDLCALC 122 (H) 12/23/2014   ALT 22 04/07/2016   AST 22 04/07/2016   NA 137 04/07/2016   K 3.9 04/07/2016   CL 103 04/07/2016   CREATININE 1.14 04/07/2016   BUN 16 04/07/2016   CO2 25 04/07/2016   TSH 2.08 11/28/2013   PSA 1.16 12/29/2015    Ct Head Wo Contrast  Result Date: 04/07/2016 CLINICAL DATA:  Hypertension for 3 weeks with dizziness, blurred vision and right hand tingling over the last week. EXAM: CT HEAD WITHOUT CONTRAST TECHNIQUE: Contiguous axial images were obtained from the base of the skull through the vertex without intravenous contrast. COMPARISON:  Head CT 07/18/2014.  MRI brain 12/03/2010 FINDINGS: Brain: There is no evidence of acute intracranial hemorrhage, mass lesion, brain edema or extra-axial fluid collection. The ventricles and subarachnoid spaces are appropriately sized for  age. There is no CT evidence of acute cortical infarction. Bones/sinuses/visualized face: Mild ethmoid sinus mucosal thickening has improved compared with the prior study. The visualized paranasal sinuses, mastoid air cells and middle ears are otherwise clear. The calvarium is intact. IMPRESSION: No acute intracranial findings. Chronic ethmoid sinus mucosal thickening, improved from previous study of 2015. Electronically Signed   By: Richardean Sale M.D.   On: 04/07/2016 18:53    Assessment & Plan:   Problem List Items Addressed This Visit    Edema - Primary    Pitting, both LE, often to thighs.  suspect multifactorial.  Rule out lymphedema, diastolic dysfuntion,, medication side effect (amlodipine 10 mg dose), thyroid, nephrotic syndrome. Labs, [place], and ECHO ordered  . Daily use of compression stockings advised,  Every other day use of furosemide advised.       Relevant Orders   Microalbumin / creatinine urine ratio   Ambulatory referral to Vascular Surgery   ECHOCARDIOGRAM COMPLETE   TSH   Essential hypertension,  benign    Current medications may be aggravating his edema.  Patient agrees to reduce amlodipine to 5 mg and resume losartan        Relevant Medications   furosemide (LASIX) 20 MG tablet   amLODipine (NORVASC) 5 MG tablet   History of tobacco abuse   Hyperlipidemia LDL goal <100    Untreated due to patient preference.  Repeat lipids ordered      Relevant Medications   furosemide (LASIX) 20 MG tablet   amLODipine (NORVASC) 5 MG tablet   Polyarthritis    Rule out RA, HH, etc serologies ordered       Relevant Orders   Sedimentation rate   C-reactive protein   Rheumatoid Factor   ANA w/Reflex if Positive   CBC with Differential/Platelet    Other Visit Diagnoses    Essential hypertension       Relevant Medications   furosemide (LASIX) 20 MG tablet   amLODipine (NORVASC) 5 MG tablet   Other Relevant Orders   Comprehensive metabolic panel   Erectile dysfunction,  unspecified erectile dysfunction type       Relevant Orders   Testos,Total,Free and SHBG (Male)   Family history of dissecting aortic aneurysm       Relevant Orders   US Aorta    A total of 45 minutes was spent with patient more than half of which was spent in counseling patient on the above mentioned issues , reviewing and explaining previous imaging studies done, and coordination of care. I have changed Mr. Rycroft amLODipine. I am also having him start on furosemide. Additionally, I am having him maintain his VIAGRA, budesonide, buPROPion, and losartan.  Meds ordered this encounter  Medications  . furosemide (LASIX) 20 MG tablet    Sig: Take 1 tablet (20 mg total) by mouth daily.    Dispense:  30 tablet    Refill:  3  . amLODipine (NORVASC) 5 MG tablet    Sig: Take 1 tablet (5 mg total) by mouth daily.    Dispense:  30 tablet    Refill:  6    Medications Discontinued During This Encounter  Medication Reason  . amLODipine (NORVASC) 10 MG tablet Reorder    Follow-up: Return fasting labs this week , for fasting  labs tomorrow 9 am .   Crecencio Mc, MD

## 2016-11-18 ENCOUNTER — Other Ambulatory Visit (INDEPENDENT_AMBULATORY_CARE_PROVIDER_SITE_OTHER): Payer: BLUE CROSS/BLUE SHIELD

## 2016-11-18 ENCOUNTER — Encounter: Payer: Self-pay | Admitting: Internal Medicine

## 2016-11-18 DIAGNOSIS — I10 Essential (primary) hypertension: Secondary | ICD-10-CM

## 2016-11-18 DIAGNOSIS — M130 Polyarthritis, unspecified: Secondary | ICD-10-CM | POA: Diagnosis not present

## 2016-11-18 DIAGNOSIS — N529 Male erectile dysfunction, unspecified: Secondary | ICD-10-CM

## 2016-11-18 DIAGNOSIS — R601 Generalized edema: Secondary | ICD-10-CM

## 2016-11-18 DIAGNOSIS — R609 Edema, unspecified: Secondary | ICD-10-CM | POA: Insufficient documentation

## 2016-11-18 DIAGNOSIS — Z87891 Personal history of nicotine dependence: Secondary | ICD-10-CM | POA: Insufficient documentation

## 2016-11-18 LAB — CBC WITH DIFFERENTIAL/PLATELET
BASOS ABS: 0.1 10*3/uL (ref 0.0–0.1)
BASOS PCT: 1 % (ref 0.0–3.0)
Eosinophils Absolute: 1.4 10*3/uL — ABNORMAL HIGH (ref 0.0–0.7)
Eosinophils Relative: 15 % — ABNORMAL HIGH (ref 0.0–5.0)
HEMATOCRIT: 46.9 % (ref 39.0–52.0)
Hemoglobin: 15.8 g/dL (ref 13.0–17.0)
LYMPHS ABS: 1.7 10*3/uL (ref 0.7–4.0)
Lymphocytes Relative: 18.2 % (ref 12.0–46.0)
MCHC: 33.8 g/dL (ref 30.0–36.0)
MCV: 92.4 fl (ref 78.0–100.0)
MONOS PCT: 6.7 % (ref 3.0–12.0)
Monocytes Absolute: 0.6 10*3/uL (ref 0.1–1.0)
NEUTROS ABS: 5.4 10*3/uL (ref 1.4–7.7)
NEUTROS PCT: 59.1 % (ref 43.0–77.0)
PLATELETS: 239 10*3/uL (ref 150.0–400.0)
RBC: 5.08 Mil/uL (ref 4.22–5.81)
RDW: 13.5 % (ref 11.5–15.5)
WBC: 9.1 10*3/uL (ref 4.0–10.5)

## 2016-11-18 LAB — COMPREHENSIVE METABOLIC PANEL
ALT: 16 U/L (ref 0–53)
AST: 19 U/L (ref 0–37)
Albumin: 3.5 g/dL (ref 3.5–5.2)
Alkaline Phosphatase: 58 U/L (ref 39–117)
BUN: 16 mg/dL (ref 6–23)
CO2: 31 mEq/L (ref 19–32)
Calcium: 8.7 mg/dL (ref 8.4–10.5)
Chloride: 104 mEq/L (ref 96–112)
Creatinine, Ser: 1.24 mg/dL (ref 0.40–1.50)
GFR: 62.83 mL/min (ref >60.00)
Glucose, Bld: 96 mg/dL (ref 70–99)
Potassium: 4.2 mEq/L (ref 3.5–5.1)
Sodium: 139 mEq/L (ref 135–145)
Total Bilirubin: 0.5 mg/dL (ref 0.2–1.2)
Total Protein: 5.9 g/dL — ABNORMAL LOW (ref 6.0–8.3)

## 2016-11-18 LAB — SEDIMENTATION RATE: Sed Rate: 12 mm/hr (ref 0–20)

## 2016-11-18 LAB — MICROALBUMIN / CREATININE URINE RATIO
CREATININE, U: 376 mg/dL
Microalb Creat Ratio: 0.3 mg/g (ref 0.0–30.0)
Microalb, Ur: 1 mg/dL (ref 0.0–1.9)

## 2016-11-18 LAB — C-REACTIVE PROTEIN: CRP: 1.1 mg/dL (ref 0.5–20.0)

## 2016-11-18 LAB — TSH: TSH: 3.11 u[IU]/mL (ref 0.35–4.50)

## 2016-11-18 NOTE — Assessment & Plan Note (Signed)
Last aortic evaluation was a contrasted CT in 2012 around the time he quit smoking.  Referral to [facility] for abdominal ultrasound

## 2016-11-18 NOTE — Assessment & Plan Note (Signed)
Untreated due to patient preference.  Repeat lipids ordered

## 2016-11-18 NOTE — Assessment & Plan Note (Signed)
Rule out RA, HH, etc serologies ordered

## 2016-11-18 NOTE — Assessment & Plan Note (Addendum)
Pitting, both LE, often to thighs.  suspect multifactorial.  Rule out lymphedema, diastolic dysfuntion,, medication side effect (amlodipine 10 mg dose), thyroid, nephrotic syndrome. Labs, [place], and ECHO ordered  . Daily use of compression stockings advised,  Every other day use of furosemide advised.

## 2016-11-18 NOTE — Assessment & Plan Note (Signed)
Current medications may be aggravating his edema.  Patient agrees to reduce amlodipine to 5 mg and resume losartan

## 2016-11-19 ENCOUNTER — Encounter (INDEPENDENT_AMBULATORY_CARE_PROVIDER_SITE_OTHER): Payer: Self-pay | Admitting: Vascular Surgery

## 2016-11-19 ENCOUNTER — Ambulatory Visit (INDEPENDENT_AMBULATORY_CARE_PROVIDER_SITE_OTHER): Payer: BLUE CROSS/BLUE SHIELD | Admitting: Vascular Surgery

## 2016-11-19 VITALS — BP 102/62 | HR 94 | Resp 16 | Ht 70.0 in | Wt 230.0 lb

## 2016-11-19 DIAGNOSIS — M79605 Pain in left leg: Secondary | ICD-10-CM | POA: Diagnosis not present

## 2016-11-19 DIAGNOSIS — I872 Venous insufficiency (chronic) (peripheral): Secondary | ICD-10-CM | POA: Insufficient documentation

## 2016-11-19 DIAGNOSIS — M7989 Other specified soft tissue disorders: Secondary | ICD-10-CM | POA: Diagnosis not present

## 2016-11-19 DIAGNOSIS — I10 Essential (primary) hypertension: Secondary | ICD-10-CM | POA: Diagnosis not present

## 2016-11-19 DIAGNOSIS — E785 Hyperlipidemia, unspecified: Secondary | ICD-10-CM | POA: Diagnosis not present

## 2016-11-19 DIAGNOSIS — M79604 Pain in right leg: Secondary | ICD-10-CM | POA: Diagnosis not present

## 2016-11-19 LAB — RHEUMATOID FACTOR

## 2016-11-19 NOTE — Assessment & Plan Note (Signed)
Recommend:  The patient has atypical pain symptoms for pure atherosclerotic disease. However, on physical exam there is evidence of mixed venous and arterial disease, given the diminished pulses and the edema associated with venous changes of the legs.  Noninvasive studies including ABI's and venous ultrasound of the legs will be obtained and the patient will follow up with me to review these studies.  The patient should continue walking and begin a more formal exercise program. The patient should continue his antiplatelet therapy and aggressive treatment of the lipid abnormalities.  The patient should begin wearing graduated compression socks 20-30 mmHg strength to control edema.

## 2016-11-19 NOTE — Assessment & Plan Note (Signed)
blood pressure control important in reducing the progression of atherosclerotic disease. On appropriate oral medications.  

## 2016-11-19 NOTE — Progress Notes (Signed)
Patient ID: Dustin [name], male   DOB: 30-Jan-1956, 61 y.o.   MRN: [id number]  Chief Complaint  Patient presents with  . New Evaluation    Carotid/PVD, leg swelling and numbness    HPI Dustin [name] is a 61 y.o. male.  I am asked to see the patient by Dustin [name] for evaluation of leg pain and swelling.  The patient reports Worsening pain and swelling in his legs over the past month or 2. Although this did correlate somewhat after the increase in the dose of his amlodipine, there is not a clear inciting event or causative factor. Both lower extremities are affected. He describes his legs feeling like he is walking and mild and having pain that radiates from the thighs to the calves with activity. This is not relieved with rest. He does not have ulceration or infection. He has no history of DVT or superficial thrombophlebitis to his knowledge. He has worn some compression stockings with mild improvement intermittently. Leg elevation as helped as well. The dose of his amlodipine was cut back and he was given some Lasix with some improvement as well.   Past Medical History:  Diagnosis Date  . Cerebral aneurysm   . History of colonic polyps   . Hypertension   . 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 Testicular cancer [facility]) 1978   teratocarcinoma    Past Surgical History:  Procedure Laterality Date  . ABDOMINAL EXPLORATION SURGERY  1978  . APPENDECTOMY     at same time as lymph node dissection  . ESOPHAGOGASTRODUODENOSCOPY  03/08    and colon  . INGUINAL HERNIA REPAIR  child   bilateral  . MOHS SURGERY Left nose   at Dustin [name]--- 2013  . prolactinoma excision  09/04   Dustin [name]  . right testicle removed  1978  . SHOULDER OPEN ROTATOR CUFF REPAIR Left 6/17   and biceps repair--[facility]  . stress nuclear  negative   10/09  . TONSILLECTOMY      Family History  Problem Relation Age of Onset  . AAA (abdominal aortic aneurysm) Father   . Cancer Paternal Aunt     lung  . Cancer Other       strong in family  . Cancer Brother     melanoma  . Cerebral aneurysm Paternal Grandfather     Social History Social History  Substance Use Topics  . Smoking status: Former Smoker    Packs/day: 1.50    Years: 40.00    Types: Cigarettes    Quit date: 04/25/2012  . Smokeless tobacco: Never Used  . Alcohol use 4.2 oz/week    7 Standard drinks or equivalent per week     Comment: occassionally  No IVDU  Allergies  Allergen Reactions  . Omeprazole-Sodium Bicarbonate     REACTION: Didn't feel good  . Varenicline Tartrate     Current Outpatient Prescriptions  Medication Sig Dispense Refill  . amLODipine (NORVASC) 5 MG tablet Take 1 tablet (5 mg total) by mouth daily. 30 tablet 6  . budesonide (RHINOCORT ALLERGY) 32 MCG/ACT nasal spray Place 2 sprays into both nostrils daily. Reported on 03/31/2016    . buPROPion (WELLBUTRIN SR) 150 MG 12 hr tablet 150 mg daily.     . furosemide (LASIX) 20 MG tablet Take 1 tablet (20 mg total) by mouth daily. 30 tablet 3  . losartan (COZAAR) 50 MG tablet Take 1 tablet (50 mg total) by mouth daily. 90 tablet 3  .  VIAGRA 100 MG tablet TAKE 1 TABLET BY MOUTH AS NEEDED FOR ERECTILE DYSFUNCTION 6 tablet 11   No current facility-administered medications for this visit.       REVIEW OF SYSTEMS (Negative unless checked)  Constitutional: [] Weight loss  [] Fever  [] Chills Cardiac: [] Chest pain   [] Chest pressure   [] Palpitations   [] Shortness of breath when laying flat   [] Shortness of breath at rest   [] Shortness of breath with exertion. Vascular:  [x] Pain in legs with walking   [] Pain in legs at rest   [] Pain in legs when laying flat   [] Claudication   [] Pain in feet when walking  [] Pain in feet at rest  [] Pain in feet when laying flat   [] History of DVT   [] Phlebitis   [x] Swelling in legs   [] Varicose veins   [] Non-healing ulcers Pulmonary:   [] Uses home oxygen   [] Productive cough   [] Hemoptysis   [] Wheeze  [] COPD   [] Asthma Neurologic:  [] Dizziness   [] Blackouts   [] Seizures   [] History of stroke   [] History of TIA  [] Aphasia   [] Temporary blindness   [] Dysphagia   [] Weakness or numbness in arms   [] Weakness or numbness in legs Musculoskeletal:  [] Arthritis   [] Joint swelling   [] Joint pain   [] Low back pain Hematologic:  [] Easy bruising  [] Easy bleeding   [] Hypercoagulable state   [] Anemic  [] Hepatitis Gastrointestinal:  [] Blood in stool   [] Vomiting blood  [] Gastroesophageal reflux/heartburn   [] Abdominal pain Genitourinary:  [] Chronic kidney disease   [] Difficult urination  [] Frequent urination  [] Burning with urination   [] Hematuria Skin:  [] Rashes   [] Ulcers   [] Wounds Psychological:  [] History of anxiety   []  History of major depression.    Physical Exam BP 102/62 (BP Location: Right Arm)   Pulse 94   Resp 16   Ht 5\' 10"  (1.778 m)   Wt 230 lb (104.3 kg)   BMI 33.00 kg/m  Gen:  WD/WN, NAD. Appears younger than stated age Head: [place]/AT, No temporalis wasting. Prominent temp pulse not noted. Ear/Nose/Throat: Hearing grossly intact, nares w/o erythema or drainage, oropharynx w/o Erythema/Exudate Eyes: Conjunctiva clear, sclera non-icteric  Neck: trachea midline.  No bruit or JVD.  Pulmonary:  Good air movement, clear to auscultation bilaterally.  Cardiac: RRR, normal S1, S2, no Murmurs, rubs or gallops. Vascular:  Vessel Right Left  Radial Palpable Palpable  Ulnar Palpable Palpable  Brachial Palpable Palpable  Carotid Palpable, without bruit Palpable, without bruit  Aorta Not palpable N/A  Femoral Palpable Palpable  Popliteal Palpable Palpable  PT 1+ Palpable 1+ Palpable  DP 1+ Palpable Palpable   Gastrointestinal: soft, non-tender/non-distended. No guarding/reflex. No masses, surgical incisions, or scars. Musculoskeletal: M/S 5/5 throughout.  Extremities without ischemic changes.  No deformity or atrophy. 1+ bilateral lower extremity edema. Neurologic: Sensation grossly intact in extremities.  Symmetrical.  Speech is  fluent. Motor exam as listed above. Psychiatric: Judgment intact, Mood & affect appropriate for pt's clinical situation. Dermatologic: No rashes or ulcers noted.  No cellulitis or open wounds. Lymph : No Cervical, Axillary, or Inguinal lymphadenopathy.   Radiology No results found.  Labs Recent Results (from the past 2160 hour(s))  Microalbumin / creatinine urine ratio     Status: None   Collection Time: 11/18/16  8:13 AM  Result Value Ref Range   Microalb, Ur 1.0 0.0 - 1.9 mg/dL   Creatinine,U 376.0 mg/dL   Microalb Creat Ratio 0.3 0.0 - 30.0 mg/g  Comprehensive metabolic panel  Status: Abnormal   Collection Time: 11/18/16  8:13 AM  Result Value Ref Range   Sodium 139 135 - 145 mEq/L   Potassium 4.2 3.5 - 5.1 mEq/L   Chloride 104 96 - 112 mEq/L   CO2 31 19 - 32 mEq/L   Glucose, Bld 96 70 - 99 mg/dL   BUN 16 6 - 23 mg/dL   Creatinine, Ser 1.24 0.40 - 1.50 mg/dL   Total Bilirubin 0.5 0.2 - 1.2 mg/dL   Alkaline Phosphatase 58 39 - 117 U/L   AST 19 0 - 37 U/L   ALT 16 0 - 53 U/L   Total Protein 5.9 (L) 6.0 - 8.3 g/dL   Albumin 3.5 3.5 - 5.2 g/dL   Calcium 8.7 8.4 - 10.5 mg/dL   GFR 62.83 >60.00 mL/min  TSH     Status: None   Collection Time: 11/18/16  8:13 AM  Result Value Ref Range   TSH 3.11 0.35 - 4.50 uIU/mL  Sedimentation rate     Status: None   Collection Time: 11/18/16  8:13 AM  Result Value Ref Range   Sed Rate 12 0 - 20 mm/hr  C-reactive protein     Status: None   Collection Time: 11/18/16  8:13 AM  Result Value Ref Range   CRP 1.1 0.5 - 20.0 mg/dL  CBC with Differential/Platelet     Status: Abnormal   Collection Time: 11/18/16  8:13 AM  Result Value Ref Range   WBC 9.1 4.0 - 10.5 K/uL   RBC 5.08 4.22 - 5.81 Mil/uL   Hemoglobin 15.8 13.0 - 17.0 g/dL   HCT 46.9 39.0 - 52.0 %   MCV 92.4 78.0 - 100.0 fl   MCHC 33.8 30.0 - 36.0 g/dL   RDW 13.5 11.5 - 15.5 %   Platelets 239.0 150.0 - 400.0 K/uL   Neutrophils Relative % 59.1 43.0 - 77.0 %   Lymphocytes  Relative 18.2 12.0 - 46.0 %   Monocytes Relative 6.7 3.0 - 12.0 %   Eosinophils Relative 15.0 (H) 0.0 - 5.0 %   Basophils Relative 1.0 0.0 - 3.0 %   Neutro Abs 5.4 1.4 - 7.7 K/uL   Lymphs Abs 1.7 0.7 - 4.0 K/uL   Monocytes Absolute 0.6 0.1 - 1.0 K/uL   Eosinophils Absolute 1.4 (H) 0.0 - 0.7 K/uL   Basophils Absolute 0.1 0.0 - 0.1 K/uL    Assessment/Plan:  Essential hypertension, benign blood pressure control important in reducing the progression of atherosclerotic disease. On appropriate oral medications.   Swelling of limb Recommend compression stockings 20-30 mmHg worn daily, elevation, and increased activity.  Pain in limb  Recommend:  The patient has atypical pain symptoms for pure atherosclerotic disease. However, on physical exam there is evidence of mixed venous and arterial disease, given the diminished pulses and the edema associated with venous changes of the legs.  Noninvasive studies including ABI's and venous ultrasound of the legs will be obtained and the patient will follow up with me to review these studies.  The patient should continue walking and begin a more formal exercise program. The patient should continue his antiplatelet therapy and aggressive treatment of the lipid abnormalities.  The patient should begin wearing graduated compression socks 20-30 mmHg strength to control edema.       Leotis Pain 11/19/2016, 1:15 PM   This note was created with Dragon medical transcription system.  Any errors from dictation are unintentional.

## 2016-11-19 NOTE — Assessment & Plan Note (Signed)
Recommend compression stockings 20-30 mmHg worn daily, elevation, and increased activity.

## 2016-11-20 LAB — ANA: Anti Nuclear Antibody(ANA): NEGATIVE

## 2016-11-22 ENCOUNTER — Encounter: Payer: Self-pay | Admitting: Internal Medicine

## 2016-11-23 ENCOUNTER — Telehealth: Payer: Self-pay | Admitting: Internal Medicine

## 2016-11-24 LAB — TESTOS,TOTAL,FREE AND SHBG (FEMALE)
SEX HORMONE BINDING GLOB.: 46 nmol/L (ref 22–77)
Testosterone, Free: 76.9 pg/mL (ref 35.0–155.0)
Testosterone,Total,LC/MS/MS: 534 ng/dL (ref 250–1100)

## 2016-11-25 ENCOUNTER — Telehealth: Payer: Self-pay

## 2016-11-25 ENCOUNTER — Ambulatory Visit (INDEPENDENT_AMBULATORY_CARE_PROVIDER_SITE_OTHER): Payer: BLUE CROSS/BLUE SHIELD

## 2016-11-25 ENCOUNTER — Other Ambulatory Visit: Payer: Self-pay | Admitting: Internal Medicine

## 2016-11-25 ENCOUNTER — Other Ambulatory Visit: Payer: Self-pay

## 2016-11-25 DIAGNOSIS — R601 Generalized edema: Secondary | ICD-10-CM

## 2016-11-25 DIAGNOSIS — Z8249 Family history of ischemic heart disease and other diseases of the circulatory system: Secondary | ICD-10-CM

## 2016-11-25 NOTE — Telephone Encounter (Signed)
He can use the furosemide once daily for 2-3 days as discussed at his visit but after that, not more than  every other day and keep wearing the stockings.  If he is tolerating the losartan that we resumed last week,  we can increase the dose to 100 mg and stop the amlodipine completely, as this blood pressure medication may be aggravating his condition of  Chronic venous insufficiency (also discussed at his visit) .  He will need a blood pressure check about a week after he makes the medication change

## 2016-11-25 NOTE — Telephone Encounter (Signed)
Patient has noticed a lot of swelling in both legs legs.  He has been traveling recently. Swelling goes down at night.   No redness or warmth in legs.   Wears compression socks.  Had Echo today.  Please advise patient aware your out of office today.

## 2016-11-26 MED ORDER — HYDROCHLOROTHIAZIDE 25 MG PO TABS: 25.0000 mg | ORAL_TABLET | Freq: Every day | ORAL | 3 refills | Status: DC

## 2016-11-26 NOTE — Telephone Encounter (Signed)
Yes, once he starts the hctz stop the lasix

## 2016-11-26 NOTE — Telephone Encounter (Signed)
Patient advised of below to in regards to medications  Add HCTZ to current dose of losartan.  Patient verbalized understanding.   Please clarify he is to discontinue lasix? Thanks.

## 2016-11-26 NOTE — Telephone Encounter (Signed)
[facility] closed today at 1:00. I will call them Monday morning to see what I can do to get his ultrasound moved up or get him scheduled at the [facility] office.

## 2016-11-26 NOTE — Telephone Encounter (Signed)
All bp medications have the potential to cause ED.  But amlodipine is definitely aggravating his edema so if he has viagra ,  I would encourage him to suspend the amlodipine, and I will try adding hctz to his current dose of losartan , and if that works, we can [employer] it a combination losarta./hctz pill in the future    To melissa:   See patient problem below about the referral  That makes no sense.  They do them in house! We will have to start over with a referral to the group in [place] then.  Melissa can you check to see why [facility] can't do ABIs or venous dopplers on patient  Until April 7 when Dr Lucky Cowboy has already seen him as a new patient?

## 2016-11-26 NOTE — Telephone Encounter (Signed)
Patient advised of below  

## 2016-11-26 NOTE — Telephone Encounter (Signed)
THANK YOU

## 2016-11-26 NOTE — Telephone Encounter (Signed)
Patient advised of below , hesitant to increase Losartan and stop amlodipine  due to afraid of ED being side effect.   He also brings up he was referred to [facility] and they are unable to due his venous doppler until 01/15/17 he would like to be referred elsewhere.  Please advise.

## 2016-11-27 ENCOUNTER — Other Ambulatory Visit: Payer: Self-pay | Admitting: Internal Medicine

## 2016-11-29 ENCOUNTER — Ambulatory Visit
Admission: RE | Admit: 2016-11-29 | Discharge: 2016-11-29 | Disposition: A | Discharge status: Home / Self Care | Payer: BLUE CROSS/BLUE SHIELD | Source: Ambulatory Visit | Attending: Internal Medicine | Admitting: Internal Medicine

## 2016-11-29 DIAGNOSIS — I10 Essential (primary) hypertension: Secondary | ICD-10-CM | POA: Insufficient documentation

## 2016-11-29 DIAGNOSIS — N281 Cyst of kidney, acquired: Secondary | ICD-10-CM | POA: Diagnosis not present

## 2016-11-29 DIAGNOSIS — Z87891 Personal history of nicotine dependence: Secondary | ICD-10-CM | POA: Diagnosis not present

## 2016-11-29 DIAGNOSIS — Z8249 Family history of ischemic heart disease and other diseases of the circulatory system: Secondary | ICD-10-CM | POA: Diagnosis present

## 2016-11-29 NOTE — Telephone Encounter (Signed)
Per [facility] he is on a cancellation list to possibly get moved up. They state that they are still short one ultrasound tech which is why he is booked out till April. I have lvm at [facility]. They will give me a call to see when we can get him in there.

## 2016-12-13 ENCOUNTER — Other Ambulatory Visit: Payer: Self-pay | Admitting: Internal Medicine

## 2016-12-16 NOTE — Telephone Encounter (Signed)
error 

## 2016-12-30 ENCOUNTER — Ambulatory Visit (HOSPITAL_COMMUNITY): Payer: BLUE CROSS/BLUE SHIELD

## 2016-12-30 ENCOUNTER — Encounter (HOSPITAL_COMMUNITY): Payer: BLUE CROSS/BLUE SHIELD

## 2017-01-04 ENCOUNTER — Encounter: Payer: BLUE CROSS/BLUE SHIELD | Admitting: Internal Medicine

## 2017-01-06 LAB — HM COLONOSCOPY

## 2017-01-12 ENCOUNTER — Ambulatory Visit (INDEPENDENT_AMBULATORY_CARE_PROVIDER_SITE_OTHER): Payer: BLUE CROSS/BLUE SHIELD

## 2017-01-12 ENCOUNTER — Encounter (INDEPENDENT_AMBULATORY_CARE_PROVIDER_SITE_OTHER): Payer: Self-pay

## 2017-01-12 ENCOUNTER — Ambulatory Visit (INDEPENDENT_AMBULATORY_CARE_PROVIDER_SITE_OTHER): Payer: BLUE CROSS/BLUE SHIELD | Admitting: Vascular Surgery

## 2017-01-12 DIAGNOSIS — M79605 Pain in left leg: Secondary | ICD-10-CM | POA: Diagnosis not present

## 2017-01-12 DIAGNOSIS — I890 Lymphedema, not elsewhere classified: Secondary | ICD-10-CM | POA: Diagnosis not present

## 2017-01-12 DIAGNOSIS — M79604 Pain in right leg: Secondary | ICD-10-CM

## 2017-01-12 DIAGNOSIS — M7989 Other specified soft tissue disorders: Secondary | ICD-10-CM | POA: Diagnosis not present

## 2017-01-12 NOTE — Progress Notes (Signed)
Subjective:    Patient ID: Dustin [name], male    DOB: 07-01-1956, 61 y.o.   MRN: [id number] Chief Complaint  Patient presents with  . Re-evaluation    Ultrasound follow up   Patient last seen on 11/19/16 for evaluation of bilateral lower extremity "pain and swelling". The patient reported worsening pain and swelling in his legs over the past few months. The patient stands and travels a lot of work. Both lower extremities are affected. His pain radiates from the thighs to the calves with activity. This is not relieved with rest. He does not have ulceration or infection. He has no history of DVT or superficial thrombophlebitis to his knowledge. Since his initial visit, the patient has worn grade one medical one compression stockings, engaged in elevation on a daily basis and has remained active with minimal relief in symptoms requiring the use of OTC anti-inflammatories. His discomfort has progressed to the point he is unable to function on a daily basis without being in significant pain. A venous duplex was notable for a ablated right GSV and reflux located in the deep venous system.    Review of Systems  Constitutional: Negative.   HENT: Negative.   Eyes: Negative.   Respiratory: Negative.   Cardiovascular: Positive for leg swelling.       Lower Extremity Pain  Gastrointestinal: Negative.   Endocrine: Negative.   Genitourinary: Negative.   Musculoskeletal: Negative.   Skin: Negative.   Allergic/Immunologic: Negative.   Neurological: Negative.   Hematological: Negative.   Psychiatric/Behavioral: Negative.       Objective:   Physical Exam  Constitutional: He appears well-developed and well-nourished. No distress.  HENT:  Head: Normocephalic and atraumatic.  Eyes: Conjunctivae are normal. Pupils are equal, round, and reactive to light.  Neck: Normal range of motion.  Cardiovascular: Normal rate, regular rhythm and normal heart sounds.   Pulses:      Radial pulses are 2+ on the  right side, and 2+ on the left side.       Dorsalis pedis pulses are 2+ on the right side, and 2+ on the left side.       Posterior tibial pulses are 2+ on the right side, and 2+ on the left side.  Pulmonary/Chest: Effort normal.  Musculoskeletal: Normal range of motion. He exhibits edema (Moderate Bilateral Edema Noted).  Neurological: He is alert.  Skin: Skin is warm and dry. He is not diaphoretic.  Psychiatric: He has a normal mood and affect. His behavior is normal. Judgment and thought content normal.   There were no vitals taken for this visit.  Past Medical History:  Diagnosis Date  . Cerebral aneurysm   . History of colonic polyps   . Hypertension   . 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 Marital status: Divorced    Spouse name: N/A  . Number of children: 0  . Years of education: N/A   Occupational History  . 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 Smoking status: Former Smoker    Packs/day: 1.50    Years: 40.00    Types: Cigarettes    Quit date: 04/25/2012  . Smokeless tobacco: Never Used  . Alcohol use 4.2 oz/week    7 Standard drinks or equivalent per week     Comment: occassionally  . Drug use: No  . Sexual activity: Yes    Partners:  Female   Other Topics Concern  . Not on file   Social History Narrative             Past Surgical History:  Procedure Laterality Date  . ABDOMINAL EXPLORATION SURGERY  1978  . APPENDECTOMY     at same time as lymph node dissection  . ESOPHAGOGASTRODUODENOSCOPY  03/08    and colon  . INGUINAL HERNIA REPAIR  child   bilateral  . MOHS SURGERY Left nose   at [facility]--- 2013  . prolactinoma excision  09/04   [name]  . right testicle removed  1978  . SHOULDER OPEN ROTATOR CUFF REPAIR Left 6/17   and biceps repair--[facility]  . stress nuclear  negative   10/09  . TONSILLECTOMY     Family History  Problem Relation Age of  Onset  . AAA (abdominal aortic aneurysm) Father   . Cancer Paternal Aunt     lung  . Cancer Other     strong in family  . Cancer Brother     melanoma  . Cerebral aneurysm Paternal Grandfather    Allergies  Allergen Reactions  . Amlodipine Swelling    LE EDEMA  . Omeprazole-Sodium Bicarbonate     REACTION: Didn't feel good  . Varenicline Tartrate       Assessment & Plan:  Patient last seen on 11/19/16 for evaluation of bilateral lower extremity "pain and swelling". The patient reported worsening pain and swelling in his legs over the past few months. The patient stands and travels a lot of work. Both lower extremities are affected. His pain radiates from the thighs to the calves with activity. This is not relieved with rest. He does not have ulceration or infection. He has no history of DVT or superficial thrombophlebitis to his knowledge. Since his initial visit, the patient has worn grade one medical one compression stockings, engaged in elevation on a daily basis and has remained active with minimal relief in symptoms requiring the use of OTC anti-inflammatories. His discomfort has progressed to the point he is unable to function on a daily basis without being in significant pain. A venous duplex was notable for a ablated right GSV and reflux located in the deep venous system.   1. Lymphedema - New Since his initial visit, the patient has worn grade one medical one compression stockings, engaged in elevation on a daily basis and has remained active with minimal relief in symptoms requiring the use of OTC anti-inflammatories.  His discomfort has progressed to the point he is unable to function on a daily basis without being in significant pain. He still presents with Stage One Lymphedema. He would greatly benefit from the added benefit of a lymphedema pump. The patient was given information on the lymphedema pump.  He will call the office if he would like to proceed.  2. Pain in both  lower extremities - Stable Continue conservative treatment. Recommend lymphedema pump.  3. Swelling of limb - Stable Continue conservative treatment. Recommend lymphedema pump.  Current Outpatient Prescriptions on File Prior to Visit  Medication Sig Dispense Refill  . amLODipine (NORVASC) 5 MG tablet Take 1 tablet (5 mg total) by mouth daily. 30 tablet 6  . budesonide (RHINOCORT ALLERGY) 32 MCG/ACT nasal spray Place 2 sprays into both nostrils daily. Reported on 03/31/2016    . buPROPion (WELLBUTRIN SR) 150 MG 12 hr tablet 150 mg daily.     . furosemide (LASIX) 20 MG tablet Take 1 tablet (20 mg total) by  mouth daily. 30 tablet 3  . hydrochlorothiazide (HYDRODIURIL) 25 MG tablet Take 1 tablet (25 mg total) by mouth daily. 30 tablet 3  . losartan (COZAAR) 50 MG tablet Take 1 tablet (50 mg total) by mouth daily. 90 tablet 3  . VIAGRA 100 MG tablet TAKE 1 TABLET BY MOUTH AS NEEDED FOR ERECTILE DYSFUNCTION 6 tablet 1   No current facility-administered medications on file prior to visit.     There are no Patient Instructions on file for this visit. No Follow-up on file.   Shristi Scheib A Deveion Denz, PA-C

## 2017-01-14 ENCOUNTER — Encounter (INDEPENDENT_AMBULATORY_CARE_PROVIDER_SITE_OTHER): Payer: BLUE CROSS/BLUE SHIELD

## 2017-01-25 ENCOUNTER — Other Ambulatory Visit: Payer: Self-pay

## 2017-01-25 MED ORDER — FUROSEMIDE 20 MG PO TABS: 20.0000 mg | ORAL_TABLET | Freq: Every day | ORAL | 0 refills | Status: DC

## 2017-03-28 ENCOUNTER — Other Ambulatory Visit: Payer: Self-pay | Admitting: Internal Medicine

## 2017-04-22 ENCOUNTER — Other Ambulatory Visit: Payer: Self-pay | Admitting: Internal Medicine

## 2017-05-28 ENCOUNTER — Other Ambulatory Visit: Payer: Self-pay | Admitting: Internal Medicine

## 2017-06-15 DIAGNOSIS — G5603 Carpal tunnel syndrome, bilateral upper limbs: Secondary | ICD-10-CM | POA: Insufficient documentation

## 2017-06-17 ENCOUNTER — Other Ambulatory Visit: Payer: Self-pay | Admitting: Internal Medicine

## 2017-06-29 DIAGNOSIS — I739 Peripheral vascular disease, unspecified: Secondary | ICD-10-CM | POA: Insufficient documentation

## 2017-07-04 DIAGNOSIS — G5622 Lesion of ulnar nerve, left upper limb: Secondary | ICD-10-CM | POA: Insufficient documentation

## 2017-07-04 DIAGNOSIS — D497 Neoplasm of unspecified behavior of endocrine glands and other parts of nervous system: Secondary | ICD-10-CM | POA: Insufficient documentation

## 2017-07-13 DIAGNOSIS — G5602 Carpal tunnel syndrome, left upper limb: Secondary | ICD-10-CM | POA: Insufficient documentation

## 2017-07-13 DIAGNOSIS — G5601 Carpal tunnel syndrome, right upper limb: Secondary | ICD-10-CM | POA: Insufficient documentation

## 2017-07-13 DIAGNOSIS — G5622 Lesion of ulnar nerve, left upper limb: Secondary | ICD-10-CM | POA: Insufficient documentation

## 2017-08-08 ENCOUNTER — Other Ambulatory Visit: Payer: Self-pay

## 2017-08-08 ENCOUNTER — Telehealth: Payer: Self-pay

## 2017-08-08 ENCOUNTER — Other Ambulatory Visit: Payer: Self-pay | Admitting: Internal Medicine

## 2017-08-08 DIAGNOSIS — T8189XA Other complications of procedures, not elsewhere classified, initial encounter: Secondary | ICD-10-CM

## 2017-08-08 NOTE — Telephone Encounter (Signed)
Patient has a nonhealing surgical wound that appears to have reepithelialized on the sides preventing closure.  He does not want to return to [facility] .  Referral to [facility] for management.  Advised to use vaseline and non stick dressing and stop using alcohol and betadine

## 2017-08-08 NOTE — Telephone Encounter (Signed)
Patient came in today c/o wound on his right palm. Patient had carpal tunnel surgery done about 3 weeks ago and incision did not close. Dr. Derrel Nip looked at incision and advised patient to keep incision covered and apply vaseline. Patient stated he would like referral to wound care.   Patient also says he would like refill on Wellbutrin. It shows that this medication was written by historical provider and has not been refilled since 05/2016. Patient said while he was here he still had 3 pills left. He has an appointment with you on 08/17/2017. Ok to send in refill?

## 2017-08-08 NOTE — Telephone Encounter (Signed)
Sent in bupropion 12 hr tablet. #30 no refills.

## 2017-08-09 ENCOUNTER — Other Ambulatory Visit: Payer: Self-pay | Admitting: Internal Medicine

## 2017-08-10 ENCOUNTER — Other Ambulatory Visit: Payer: Self-pay

## 2017-08-10 DIAGNOSIS — R29898 Other symptoms and signs involving the musculoskeletal system: Secondary | ICD-10-CM

## 2017-08-17 ENCOUNTER — Ambulatory Visit: Payer: BLUE CROSS/BLUE SHIELD | Admitting: Internal Medicine

## 2017-08-17 ENCOUNTER — Encounter: Payer: Self-pay | Admitting: Internal Medicine

## 2017-08-17 VITALS — BP 120/64 | HR 82 | Temp 97.8°F | Resp 15 | Ht 70.0 in | Wt 234.4 lb

## 2017-08-17 DIAGNOSIS — R3915 Urgency of urination: Secondary | ICD-10-CM | POA: Diagnosis not present

## 2017-08-17 DIAGNOSIS — R5383 Other fatigue: Secondary | ICD-10-CM | POA: Diagnosis not present

## 2017-08-17 DIAGNOSIS — M79605 Pain in left leg: Secondary | ICD-10-CM

## 2017-08-17 DIAGNOSIS — Z125 Encounter for screening for malignant neoplasm of prostate: Secondary | ICD-10-CM

## 2017-08-17 DIAGNOSIS — I10 Essential (primary) hypertension: Secondary | ICD-10-CM | POA: Diagnosis not present

## 2017-08-17 DIAGNOSIS — N401 Enlarged prostate with lower urinary tract symptoms: Secondary | ICD-10-CM

## 2017-08-17 DIAGNOSIS — Z87891 Personal history of nicotine dependence: Secondary | ICD-10-CM

## 2017-08-17 DIAGNOSIS — E538 Deficiency of other specified B group vitamins: Secondary | ICD-10-CM

## 2017-08-17 DIAGNOSIS — R601 Generalized edema: Secondary | ICD-10-CM | POA: Diagnosis not present

## 2017-08-17 DIAGNOSIS — E559 Vitamin D deficiency, unspecified: Secondary | ICD-10-CM

## 2017-08-17 DIAGNOSIS — E785 Hyperlipidemia, unspecified: Secondary | ICD-10-CM | POA: Diagnosis not present

## 2017-08-17 DIAGNOSIS — Z0000 Encounter for general adult medical examination without abnormal findings: Secondary | ICD-10-CM

## 2017-08-17 DIAGNOSIS — N529 Male erectile dysfunction, unspecified: Secondary | ICD-10-CM | POA: Diagnosis not present

## 2017-08-17 DIAGNOSIS — M79604 Pain in right leg: Secondary | ICD-10-CM | POA: Diagnosis not present

## 2017-08-17 DIAGNOSIS — Z1211 Encounter for screening for malignant neoplasm of colon: Secondary | ICD-10-CM | POA: Diagnosis not present

## 2017-08-17 LAB — LIPID PANEL
CHOL/HDL RATIO: 5
Cholesterol: 213 mg/dL — ABNORMAL HIGH (ref 0–200)
HDL: 40.3 mg/dL (ref >39.00)
NonHDL: 173.09
Triglycerides: 275 mg/dL — ABNORMAL HIGH (ref 0.0–149.0)
VLDL: 55 mg/dL — AB (ref 0.0–40.0)

## 2017-08-17 LAB — CBC WITH DIFFERENTIAL/PLATELET
Basophils Absolute: 0 10*3/uL (ref 0.0–0.1)
Basophils Relative: 0.6 % (ref 0.0–3.0)
EOS ABS: 0.3 10*3/uL (ref 0.0–0.7)
Eosinophils Relative: 4 % (ref 0.0–5.0)
HCT: 44.9 % (ref 39.0–52.0)
HEMOGLOBIN: 15.2 g/dL (ref 13.0–17.0)
Lymphocytes Relative: 18.2 % (ref 12.0–46.0)
Lymphs Abs: 1.4 10*3/uL (ref 0.7–4.0)
MCHC: 33.9 g/dL (ref 30.0–36.0)
MCV: 94.1 fl (ref 78.0–100.0)
MONO ABS: 0.4 10*3/uL (ref 0.1–1.0)
Monocytes Relative: 5.2 % (ref 3.0–12.0)
NEUTROS PCT: 72 % (ref 43.0–77.0)
Neutro Abs: 5.6 10*3/uL (ref 1.4–7.7)
Platelets: 211 10*3/uL (ref 150.0–400.0)
RBC: 4.77 Mil/uL (ref 4.22–5.81)
RDW: 13.4 % (ref 11.5–15.5)
WBC: 7.7 10*3/uL (ref 4.0–10.5)

## 2017-08-17 LAB — COMPREHENSIVE METABOLIC PANEL
ALBUMIN: 3.9 g/dL (ref 3.5–5.2)
ALT: 18 U/L (ref 0–53)
AST: 17 U/L (ref 0–37)
Alkaline Phosphatase: 59 U/L (ref 39–117)
BILIRUBIN TOTAL: 0.5 mg/dL (ref 0.2–1.2)
BUN: 17 mg/dL (ref 6–23)
CALCIUM: 9.4 mg/dL (ref 8.4–10.5)
CO2: 31 mEq/L (ref 19–32)
CREATININE: 1.11 mg/dL (ref 0.40–1.50)
Chloride: 101 mEq/L (ref 96–112)
GFR: 71.22 mL/min (ref >60.00)
Glucose, Bld: 79 mg/dL (ref 70–99)
Potassium: 3.7 mEq/L (ref 3.5–5.1)
Sodium: 139 mEq/L (ref 135–145)
TOTAL PROTEIN: 6.3 g/dL (ref 6.0–8.3)

## 2017-08-17 LAB — VITAMIN D 25 HYDROXY (VIT D DEFICIENCY, FRACTURES): VITD: 20.81 ng/mL — AB (ref 30.00–100.00)

## 2017-08-17 LAB — LDL CHOLESTEROL, DIRECT: LDL DIRECT: 120 mg/dL

## 2017-08-17 LAB — VITAMIN B12: VITAMIN B 12: 192 pg/mL — AB (ref 211–911)

## 2017-08-17 LAB — PSA: PSA: 0.78 ng/mL (ref 0.10–4.00)

## 2017-08-17 LAB — MICROALBUMIN / CREATININE URINE RATIO
Creatinine,U: 95.3 mg/dL
Microalb Creat Ratio: 0.7 mg/g (ref 0.0–30.0)

## 2017-08-17 MED ORDER — BUPROPION HCL ER (XL) 300 MG PO TB24
300.0000 mg | ORAL_TABLET | Freq: Every day | ORAL | 2 refills | Status: DC
Start: 2017-08-17 — End: 2017-10-06

## 2017-08-17 MED ORDER — ALPRAZOLAM 0.25 MG PO TABS: 0.2500 mg | ORAL_TABLET | Freq: Two times a day (BID) | ORAL | 2 refills | Status: DC | PRN

## 2017-08-17 MED ORDER — TADALAFIL 20 MG PO TABS: 10.0000 mg | ORAL_TABLET | ORAL | 11 refills | Status: DC | PRN

## 2017-08-17 NOTE — Patient Instructions (Addendum)
You can take 2400 mg of advil  (aka motrin/Ibuprofen ) or 1000 mg of aleve (Napoxen )  in a 24 hr period   You should protect stomach with omeprazole to prevent gastric ulcer     You can ADD up to 4000 mg tylenol in a 24 hr period but limit this to less than one week   Long term use of tylenol (acetominophen )  should reduced to 2000 mg in a 24 hr period    Try to get off of omeprazole for a day here and there    Bring home BP cuff in for calibration   Omran brand is the best    Changing wellbutrin to once daily as a trail.  Take in the morning  Alprazolam as needed for anxiety

## 2017-08-17 NOTE — Progress Notes (Signed)
Patient ID: Dustin [name], male    DOB: August 13, 1956  Age: 61 y.o. MRN: [id number]  The patient is here for annual  wellness examination and management of other chronic and acute problems.   The risk factors are reflected in the social history.  The roster of all physicians providing medical care to patient - is listed in the Snapshot section of the chart.  Activities of daily living:  The patient is 100% independent in all ADLs: dressing, toileting, feeding as well as independent mobility  Home safety : The patient has smoke detectors in the home. They wear seatbelts.  There are no firearms at home. There is no violence in the home.   There is no risks for hepatitis, STDs or HIV. There is no   history of blood transfusion. They have no travel history to infectious disease endemic areas of the world.  The patient has seen their dentist in the last six month. They have seen their eye doctor in the last year. They deny any hearing difficulty with regard to whispered voices and some television programs.  They have deferred audiologic testing in the last year.  They do not  have excessive sun exposure. Discussed the need for sun protection: hats, long sleeves and use of sunscreen if there is significant sun exposure.   Diet: the importance of a healthy diet is discussed. They do have a healthy diet.  The benefits of regular aerobic exercise were discussed. She walks 4 times per week ,  20 minutes.   Depression screen: there are no signs or vegative symptoms of depression- irritability, change in appetite, anhedonia, sadness/tearfullness.  The following portions of the patient's history were reviewed and updated as appropriate: allergies, current medications, past family history, past medical history,  past surgical history, past social history  and problem list.  Visual acuity was not assessed per patient preference since she has regular follow up with her ophthalmologist. Hearing and body mass  index were assessed and reviewed.   During the course of the visit the patient was educated and counseled about appropriate screening and preventive services including : fall prevention , diabetes screening, nutrition counseling, colorectal cancer screening, and recommended immunizations.    CC: The primary encounter diagnosis was Encounter for preventive health examination. Diagnoses of Fatigue, unspecified type, Prostate cancer screening, Essential hypertension, B12 deficiency, Essential hypertension, benign, ERECTILE DYSFUNCTION, ORGANIC, Benign prostatic hyperplasia (BPH) with urinary urgency, Pain in both lower extremities, Hyperlipidemia LDL goal <100, Colon cancer screening, Vitamin D deficiency, Generalized edema, and History of tobacco abuse were also pertinent to this visit. FOLLOW up on hypertension  And tobacco abuse. Last seen in February for new patient visit.   Lower extremity edema noted at visit with concurrent use of amlodipine and NSAIDs . , history of extensive abd/pelvic surgery for teratocarcinoma also raising possibility of lymphedema.   Amlodipine dose was reduced to 5 mg and losartan  Was Started 50 mg daily . Lasix qod.  And vascular  Referral to rule out lymphedema Venous insufficiency confirmed bilateral   LE ultrasound  dn April 2018.  Aggravated by amlodipine.    Has stoped taking it and has increased his losartan to 50 mg daily and hctz 25 mg daily with improvoed tolerance and less edema.  Using compression socks when travelling.    Wound care referral for nonhealing surgical wound of palm has been cancelled by pateint.  Wound was reevaluated by surgery and steri strips applied.   Cc: fatigue  Getting up 6 times per night to urinate  .  No caffeine after noon,  Rare alcohol.    No sex drive,  blood pressure  Fluctuates.  162/92 before meds.    Taking only Losartan 50  Mg 25 mg hctz in the am.    Has been off of cigs for 6 years .  Takes wellbutrin   History of AAA:  Last  imaging Feb 2018  noted dilation to 3.1 cm repeat 3 years       History Rashawn has a past medical history of Cerebral aneurysm, History of colonic polyps, Hypertension, Prolactinoma ([place]) (08/04), and Testicular cancer ([place]) (1978).   He has a past surgical history that includes Inguinal hernia repair (child); Tonsillectomy; right testicle removed (1978); Abdominal exploration surgery (1978); prolactinoma excision (09/04); Esophagogastroduodenoscopy (03/08 ); Appendectomy; stress nuclear (negative); Mohs surgery (Left, nose); and Shoulder open rotator cuff repair (Left, 6/17).   His family history includes AAA (abdominal aortic aneurysm) in his father; Cancer in his brother, other, and paternal aunt; Cerebral aneurysm in his paternal grandfather.He reports that he quit smoking about 5 years ago. His smoking use included cigarettes. He has a 60.00 pack-year smoking history. he has never used smokeless tobacco. He reports that he drinks about 4.2 oz of alcohol per week. He reports that he does not use drugs.  Outpatient Medications Prior to Visit  Medication Sig Dispense Refill  . budesonide (RHINOCORT ALLERGY) 32 MCG/ACT nasal spray Place 2 sprays into both nostrils daily. Reported on 03/31/2016    . hydrochlorothiazide (HYDRODIURIL) 25 MG tablet TAKE 1 TABLET (25 MG TOTAL) BY MOUTH DAILY. 30 tablet 3  . losartan (COZAAR) 50 MG tablet Take 1 tablet (50 mg total) by mouth daily. 90 tablet 3  . buPROPion (WELLBUTRIN SR) 150 MG 12 hr tablet Take 1 tablet (150 mg total) by mouth daily. 30 tablet 0  . VIAGRA 100 MG tablet TAKE 1 TABLET BY MOUTH AS NEEDED FOR ERECTILE DYSFUNCTION 6 tablet 1  . amLODipine (NORVASC) 5 MG tablet Take 1 tablet (5 mg total) by mouth daily. (Patient not taking: Reported on 08/17/2017) 30 tablet 6  . furosemide (LASIX) 20 MG tablet TAKE 1 TABLET (20 MG TOTAL) BY MOUTH DAILY. (Patient not taking: Reported on 08/17/2017) 90 tablet 1   No facility-administered medications prior  to visit.     Review of Systems  Objective:  BP 120/64 (BP Location: Left Arm, Patient Position: Sitting, Cuff Size: Large)   Pulse 82   Temp 97.8 F (36.6 C) (Oral)   Resp 15   Ht 5\' 10"  (1.778 m)   Wt 234 lb 6.4 oz (106.3 kg)   SpO2 94%   BMI 33.63 kg/m   Physical Exam    Assessment & Plan:   Problem List Items Addressed This Visit    B12 deficiency    Found during workup for fatigue.  He will return for injections,  intrinsic factor and RBC folate.   Lab Results  Component Value Date   VITAMINB12 192 (L) 08/17/2017         Relevant Orders   Folate RBC   Intrinsic Factor Antibodies   Benign prostatic hyperplasia (BPH) with urinary urgency    Suggested by history.  Trial of cialis      Edema     Multifactorial; venous insufficiency and medication side effects. Continue use of compression socks.  amlodipine dc'd       Encounter for preventive health examination - Primary  PSA is normal today. He is due for colonoscopy, last  One was normal in  2008.  Vira Agar)   Lab Results  Component Value Date   PSA 0.78 08/17/2017   PSA 1.16 12/29/2015   PSA 1.04 11/28/2013         ERECTILE DYSFUNCTION, ORGANIC    Along with s/s of bph.  (voiding 6/night) Trial of cialis       Essential hypertension, benign    Amlodipine stopped due to significant edema complicating proven venous insufficiency,,  Continue losartan 50 mg daily and hctz 25 mg daily.  Home readings elevated on old machine. Asked to resume checks on a new machine. Especially after taking NSAIDs for shoulder pain       Relevant Medications   tadalafil (CIALIS) 20 MG tablet   History of tobacco abuse    Continue wellbutrin for concurrent mood disorder.  Changing to once daily formulation to improve fatigue and concentration since he often forgets the second daily dose       Hyperlipidemia LDL goal <100    His 10 yr risk of CAD is 23% .Marland Kitchen  Statin therapy has been  advised  .  Lab Results   Component Value Date   CHOL 213 (H) 08/17/2017   HDL 40.30 08/17/2017   LDLCALC 122 (H) 12/23/2014   LDLDIRECT 120.0 08/17/2017   TRIG 275.0 (H) 08/17/2017   CHOLHDL 5 08/17/2017         Relevant Medications   tadalafil (CIALIS) 20 MG tablet   Other Relevant Orders   LDL cholesterol, direct (Completed)   Pain in limb    ABIs have been ordered twice in the past year for evaluation of possible arterial insufficiency .  Patient has apparently  deferred .       Vitamin D deficiency    Level is < 20.  Drisdol weekly x 4 months advised, followed by daily intake of 1000 IUs        Other Visit Diagnoses    Fatigue, unspecified type       Relevant Orders   Comprehensive metabolic panel (Completed)   Vitamin B12 (Completed)   CBC with Differential/Platelet (Completed)   VITAMIN D 25 Hydroxy (Vit-D Deficiency, Fractures) (Completed)   Essential hypertension       Relevant Medications   tadalafil (CIALIS) 20 MG tablet   Other Relevant Orders   Lipid panel (Completed)   Microalbumin / creatinine urine ratio (Completed)   Colon cancer screening       Relevant Orders   Ambulatory referral to Gastroenterology      I have discontinued Delrae Tykel L. Temkin III's amLODipine, VIAGRA, furosemide, and buPROPion. I am also having him start on tadalafil, buPROPion, ALPRAZolam, and ergocalciferol. Additionally, I am having him maintain his budesonide, losartan, and hydrochlorothiazide.  Meds ordered this encounter  Medications  . tadalafil (CIALIS) 20 MG tablet    Sig: Take 0.5-1 tablets (10-20 mg total) every other day as needed by mouth for erectile dysfunction.    Dispense:  5 tablet    Refill:  11  . buPROPion (WELLBUTRIN XL) 300 MG 24 hr tablet    Sig: Take 1 tablet (300 mg total) daily by mouth.    Dispense:  30 tablet    Refill:  2  . ALPRAZolam (XANAX) 0.25 MG tablet    Sig: Take 1 tablet (0.25 mg total) 2 (two) times daily as needed by mouth for anxiety.    Dispense:  30 tablet  Refill:  2  . ergocalciferol (DRISDOL) 50000 units capsule    Sig: Take 1 capsule (50,000 Units total) once a week by mouth.    Dispense:  4 capsule    Refill:  3    Medications Discontinued During This Encounter  Medication Reason  . amLODipine (NORVASC) 5 MG tablet Change in therapy  . furosemide (LASIX) 20 MG tablet Patient has not taken in last 30 days  . buPROPion (WELLBUTRIN SR) 150 MG 12 hr tablet   . VIAGRA 100 MG tablet     Follow-up: Return in about 6 months (around 02/14/2018).   Crecencio Mc, MD

## 2017-08-18 ENCOUNTER — Ambulatory Visit: Payer: BLUE CROSS/BLUE SHIELD | Admitting: Surgery

## 2017-08-18 MED ORDER — ERGOCALCIFEROL 1.25 MG (50000 UT) PO CAPS: 50000.0000 [IU] | ORAL_CAPSULE | ORAL | 3 refills | Status: DC

## 2017-08-20 DIAGNOSIS — N400 Enlarged prostate without lower urinary tract symptoms: Secondary | ICD-10-CM | POA: Insufficient documentation

## 2017-08-20 DIAGNOSIS — E559 Vitamin D deficiency, unspecified: Secondary | ICD-10-CM | POA: Insufficient documentation

## 2017-08-20 DIAGNOSIS — E538 Deficiency of other specified B group vitamins: Secondary | ICD-10-CM | POA: Insufficient documentation

## 2017-08-20 NOTE — Assessment & Plan Note (Signed)
Along with s/s of bph.  (voiding 6/night) Trial of cialis

## 2017-08-20 NOTE — Assessment & Plan Note (Signed)
Suggested by history.  Trial of cialis

## 2017-08-20 NOTE — Assessment & Plan Note (Addendum)
Amlodipine stopped due to significant edema complicating proven venous insufficiency,,  Continue losartan 50 mg daily and hctz 25 mg daily.  Home readings elevated on old machine. Asked to resume checks on a new machine. Especially after taking NSAIDs for shoulder pain

## 2017-08-20 NOTE — Assessment & Plan Note (Addendum)
PSA is normal today. He is due for colonoscopy, last  One was normal in  2008.  Vira Agar)   Lab Results  Component Value Date   PSA 0.78 08/17/2017   PSA 1.16 12/29/2015   PSA 1.04 11/28/2013

## 2017-08-20 NOTE — Assessment & Plan Note (Signed)
Continue wellbutrin for concurrent mood disorder.  Changing to once daily formulation to improve fatigue and concentration since he often forgets the second daily dose

## 2017-08-20 NOTE — Assessment & Plan Note (Signed)
Found during workup for fatigue.  He will return for injections,  intrinsic factor and RBC folate.   Lab Results  Component Value Date   VITAMINB12 192 (L) 08/17/2017

## 2017-08-20 NOTE — Assessment & Plan Note (Signed)
Level is < 20.  Drisdol weekly x 4 months advised, followed by daily intake of 1000 IUs

## 2017-08-20 NOTE — Assessment & Plan Note (Addendum)
His 10 yr risk of CAD is 23% .Marland Kitchen  Statin therapy has been  advised  .  Lab Results  Component Value Date   CHOL 213 (H) 08/17/2017   HDL 40.30 08/17/2017   LDLCALC 122 (H) 12/23/2014   LDLDIRECT 120.0 08/17/2017   TRIG 275.0 (H) 08/17/2017   CHOLHDL 5 08/17/2017

## 2017-08-20 NOTE — Assessment & Plan Note (Signed)
ABIs have been ordered twice in the past year for evaluation of possible arterial insufficiency .  Patient has apparently  deferred .

## 2017-08-20 NOTE — Assessment & Plan Note (Addendum)
Multifactorial; venous insufficiency and medication side effects. Continue use of compression socks.  amlodipine dc'd

## 2017-08-25 ENCOUNTER — Telehealth: Payer: Self-pay

## 2017-08-25 NOTE — Telephone Encounter (Signed)
Prio auth started for key entered in CoverMyMeds today. Results within 72 hours per website.

## 2017-08-26 ENCOUNTER — Encounter: Payer: Self-pay | Admitting: *Deleted

## 2017-08-30 ENCOUNTER — Telehealth: Payer: Self-pay | Admitting: Internal Medicine

## 2017-08-30 ENCOUNTER — Ambulatory Visit (INDEPENDENT_AMBULATORY_CARE_PROVIDER_SITE_OTHER): Payer: BLUE CROSS/BLUE SHIELD | Admitting: *Deleted

## 2017-08-30 ENCOUNTER — Ambulatory Visit: Payer: Self-pay

## 2017-08-30 DIAGNOSIS — E538 Deficiency of other specified B group vitamins: Secondary | ICD-10-CM

## 2017-08-30 MED ORDER — CYANOCOBALAMIN 1000 MCG/ML IJ SOLN
1000.0000 ug | Freq: Once | INTRAMUSCULAR | Status: AC
  Administered 2017-08-30: 1000 ug via INTRAMUSCULAR

## 2017-08-30 NOTE — Telephone Encounter (Signed)
Spoke with Janett Billow from [place] to let her know that it is for the generic, tadafidil. Janett Billow stated that Cialis does not require PA any longer.

## 2017-08-30 NOTE — Telephone Encounter (Signed)
Copied from [place] #[id number]. Topic: Quick Communication - See Telephone Encounter >> Aug 30, 2017  9:10 AM Burnis Medin, NT wrote: CRM for notification. See Telephone encounter for: Dustin [name] from [facility] is calling to confirm which medication pt. Supposed to have. Mediaction is Brand Cialis or Tadalisil.  08/30/17.

## 2017-08-30 NOTE — Progress Notes (Addendum)
Patient presented for B 12 injection to left deltoid, patient voiced no concerns nor showed any signs of distress during injection See lab note dated for 08/17/17 for order for B 12 injections.  Reviewed.  Dr Nicki Reaper

## 2017-08-30 NOTE — Telephone Encounter (Signed)
Please advise 

## 2017-09-07 ENCOUNTER — Ambulatory Visit (INDEPENDENT_AMBULATORY_CARE_PROVIDER_SITE_OTHER): Payer: BLUE CROSS/BLUE SHIELD | Admitting: *Deleted

## 2017-09-07 ENCOUNTER — Encounter: Payer: BLUE CROSS/BLUE SHIELD | Admitting: Surgery

## 2017-09-07 ENCOUNTER — Encounter (HOSPITAL_COMMUNITY): Payer: BLUE CROSS/BLUE SHIELD

## 2017-09-07 DIAGNOSIS — E538 Deficiency of other specified B group vitamins: Secondary | ICD-10-CM

## 2017-09-07 MED ORDER — CYANOCOBALAMIN 1000 MCG/ML IJ SOLN
1000.0000 ug | Freq: Once | INTRAMUSCULAR | Status: AC
  Administered 2017-09-07: 1000 ug via INTRAMUSCULAR

## 2017-09-07 NOTE — Progress Notes (Signed)
Patient presented for B 12 injection to right deltoid, patient voiced no concerns nor showed any signs of distress during injection. 

## 2017-09-11 ENCOUNTER — Other Ambulatory Visit: Payer: Self-pay | Admitting: Internal Medicine

## 2017-09-14 ENCOUNTER — Ambulatory Visit (INDEPENDENT_AMBULATORY_CARE_PROVIDER_SITE_OTHER): Payer: BLUE CROSS/BLUE SHIELD | Admitting: *Deleted

## 2017-09-14 DIAGNOSIS — E538 Deficiency of other specified B group vitamins: Secondary | ICD-10-CM

## 2017-09-14 MED ORDER — CYANOCOBALAMIN 1000 MCG/ML IJ SOLN
1000.0000 ug | Freq: Once | INTRAMUSCULAR | Status: AC
  Administered 2017-09-14: 1000 ug via INTRAMUSCULAR

## 2017-09-14 NOTE — Progress Notes (Signed)
Patient presented for B 12 injection to left deltoid, patient voiced no concerns nor showed any signs of distress during injection. 

## 2017-09-21 ENCOUNTER — Ambulatory Visit (INDEPENDENT_AMBULATORY_CARE_PROVIDER_SITE_OTHER): Payer: BLUE CROSS/BLUE SHIELD | Admitting: *Deleted

## 2017-09-21 DIAGNOSIS — E538 Deficiency of other specified B group vitamins: Secondary | ICD-10-CM

## 2017-09-21 MED ORDER — CYANOCOBALAMIN 1000 MCG/ML IJ SOLN
1000.0000 ug | Freq: Once | INTRAMUSCULAR | Status: AC
  Administered 2017-09-21: 1000 ug via INTRAMUSCULAR

## 2017-09-21 NOTE — Progress Notes (Signed)
Patient presented for B 12 injection to right deltoid, patient voiced no concerns nor showed any signs of distress during injection. 

## 2017-09-23 ENCOUNTER — Other Ambulatory Visit (INDEPENDENT_AMBULATORY_CARE_PROVIDER_SITE_OTHER): Payer: BLUE CROSS/BLUE SHIELD

## 2017-09-23 DIAGNOSIS — E538 Deficiency of other specified B group vitamins: Secondary | ICD-10-CM

## 2017-09-27 LAB — FOLATE RBC: RBC FOLATE: 562 ng/mL (ref >280)

## 2017-09-27 LAB — INTRINSIC FACTOR ANTIBODIES: Intrinsic Factor: NEGATIVE

## 2017-09-28 ENCOUNTER — Encounter: Payer: Self-pay | Admitting: Internal Medicine

## 2017-10-06 ENCOUNTER — Other Ambulatory Visit: Payer: Self-pay

## 2017-10-06 MED ORDER — HYDROCHLOROTHIAZIDE 25 MG PO TABS: 25.0000 mg | ORAL_TABLET | Freq: Every day | ORAL | 1 refills | Status: DC

## 2017-10-06 MED ORDER — BUPROPION HCL ER (XL) 300 MG PO TB24: 300.0000 mg | ORAL_TABLET | Freq: Every day | ORAL | 0 refills | Status: DC

## 2017-10-14 ENCOUNTER — Other Ambulatory Visit: Payer: Self-pay | Admitting: Internal Medicine

## 2017-10-18 ENCOUNTER — Ambulatory Visit: Payer: BLUE CROSS/BLUE SHIELD

## 2017-10-18 ENCOUNTER — Telehealth: Payer: Self-pay | Admitting: Internal Medicine

## 2017-10-18 NOTE — Telephone Encounter (Signed)
Unlikely to be the losartan since it is not happening repeatedly.  Likely due to a drop in BP from prlonged squatting.  He .  Should check BP at home or pharmacy a few times to see how low it is going

## 2017-10-18 NOTE — Telephone Encounter (Signed)
Patient notified and voiced understanding said he will monitor and bring readings by office.

## 2017-10-18 NOTE — Telephone Encounter (Signed)
Patient came into the office for verification on the Mcg of B 12 to take orally while in office patient stated he had a concern about  something that happened about a week ago while cutting down some small tree's with a hatchet, he had been in a squatted position for several minutes and then stood up and felt nauseous, stated this lasted a couple of minutes and then he was fine. Patient was fine in office I advised him he needed to FU with PCP, and if this happened again he needs to be evaluated immediately. Patient refused anything today but wanted to know if losartan could cause this he had heard a lot of bad things.

## 2017-11-15 DIAGNOSIS — G8929 Other chronic pain: Secondary | ICD-10-CM | POA: Insufficient documentation

## 2017-11-16 ENCOUNTER — Ambulatory Visit: Payer: Self-pay | Admitting: *Deleted

## 2017-11-16 NOTE — Telephone Encounter (Signed)
Dustin [name] phoned in to report flu-like symptoms, and to get an appointment. He began having body aches and sore throat yesterday. His temperature this morning was 102 before taking tylenol 1000 mg. He stated the tylenol helps the body aches and brought down Temp to 100. He coughed up thick, brown phlegm very early this morning but none since then. He did report his chest feels slightly tight this morning. An appointment was made for tomorrow as no availability today. Reason for Disposition . [1] Fever > 101 F (38.3 C) AND [2] age > 20  Answer Assessment - Initial Assessment Questions 1. WORST SYMPTOM: "What is your worst symptom?" (e.g., cough, runny nose, muscle aches, headache, sore throat, fever)     Muscle aches 2. ONSET: "When did your flu symptoms start?"      24 hours ago 3. COUGH: "How bad is the cough?"       Not today but yesterday all day dry hacky cough. Thick brown phlegm during the night. 4. RESPIRATORY DISTRESS: "Describe your breathing."     Chest tightness 5. FEVER: "Do you have a fever?" If so, ask: "What is your temperature, how was it measured, and when did it start?"     Temp 102 this morning. 6. EXPOSURE: "Were you exposed to someone with influenza?"       Not that he knows. 7. FLU VACCINE: "Did you get a flu shot this year?"   no 8. HIGH RISK DISEASE: "Do you any chronic medical problems?" (e.g., heart or lung disease, asthma, weak immune system, or other HIGH RISK conditions)     Yes, high blood pressure. 9. PREGNANCY: "Is there any chance you are pregnant?" "When was your last menstrual period?"    na 10. OTHER SYMPTOMS: "Do you have any other symptoms?"  (e.g., runny nose, muscle aches, headache, sore throat)   Sore throat yesterday, this morning.  Protocols used: INFLUENZA - SEASONAL-A-AH

## 2017-11-16 NOTE — Telephone Encounter (Signed)
Opened in error

## 2017-11-17 ENCOUNTER — Ambulatory Visit (INDEPENDENT_AMBULATORY_CARE_PROVIDER_SITE_OTHER): Payer: BLUE CROSS/BLUE SHIELD | Admitting: Family Medicine

## 2017-11-17 ENCOUNTER — Encounter: Payer: Self-pay | Admitting: Family Medicine

## 2017-11-17 VITALS — BP 134/78 | HR 102 | Temp 103.0°F | Ht 70.0 in | Wt 227.8 lb

## 2017-11-17 DIAGNOSIS — J020 Streptococcal pharyngitis: Secondary | ICD-10-CM

## 2017-11-17 DIAGNOSIS — J101 Influenza due to other identified influenza virus with other respiratory manifestations: Secondary | ICD-10-CM

## 2017-11-17 DIAGNOSIS — R509 Fever, unspecified: Secondary | ICD-10-CM | POA: Diagnosis not present

## 2017-11-17 DIAGNOSIS — R070 Pain in throat: Secondary | ICD-10-CM

## 2017-11-17 LAB — POCT RAPID STREP A (OFFICE): RAPID STREP A SCREEN: POSITIVE — AB

## 2017-11-17 LAB — POC INFLUENZA A&B (BINAX/QUICKVUE)
INFLUENZA A, POC: POSITIVE — AB
Influenza B, POC: NEGATIVE

## 2017-11-17 MED ORDER — OSELTAMIVIR PHOSPHATE 75 MG PO CAPS: 75.0000 mg | ORAL_CAPSULE | Freq: Two times a day (BID) | ORAL | 0 refills | Status: DC

## 2017-11-17 MED ORDER — AMOXICILLIN 500 MG PO CAPS: 500.0000 mg | ORAL_CAPSULE | Freq: Two times a day (BID) | ORAL | 0 refills | Status: DC

## 2017-11-17 NOTE — Progress Notes (Signed)
Patient ID: Dustin [name], male   DOB: 1956/08/01, 62 y.o.   MRN: [id number]  PCP: Crecencio Mc, MD  Subjective:  Dustin [name] is a 62 y.o. year old very pleasant male patient who presents with flu like symptoms including fever, chills, body aches.  -other symptoms: Rhinitis, sinus pressure/pain, cough that minimally productive of brown/yellow sputum. He denies N/V/D -started: 2 days ago -inside 48 hour treatment window if needed for tamiflu: yes -high risk condition (children <5, adults >65, chronic pulmonary or cardiac condition, immunosuppression, pregnancy, nursing home resident, morbid obesity) : No -symptoms are not improving -previous treatments: Extra strength Tylenol 1000 mg TID and OTC cough syrup has provided limited benefit - patient did not  receive flu shot this year.  -  sick contact; specifically influenza: None  Former smoker; quit 6 years ago No recent antibiotic use.  ROS-denies SOB, NVD, sinus or dental pain  Pertinent Past Medical History-  Patient Active Problem List   Diagnosis Date Noted  . B12 deficiency 08/20/2017  . Vitamin D deficiency 08/20/2017  . Venous insufficiency of both lower extremities 11/19/2016  . Pain in limb 11/19/2016  . Edema 11/18/2016  . History of tobacco abuse 11/18/2016  . Polyarthritis 11/18/2016  . External hemorrhoid 09/13/2016  . FHx: AAA 06/28/2016  . Right arm pain 04/19/2016  . Actinic keratosis 12/29/2015  . Left shoulder pain 12/29/2015  . Encounter for preventive health examination 10/26/2011  . Hyperlipidemia LDL goal <100 09/22/2009  . GERD 07/12/2008  . 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 Essential hypertension, benign 06/08/2007  . CEREBRAL ANEURYSM 06/08/2007  . 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    Medications- reviewed  Current Outpatient Medications  Medication Sig Dispense Refill  . ALPRAZolam (XANAX) 0.25 MG tablet Take 1 tablet (0.25 mg total) 2 (two) times daily as needed by mouth for  anxiety. 30 tablet 2  . budesonide (RHINOCORT ALLERGY) 32 MCG/ACT nasal spray Place 2 sprays into both nostrils daily. Reported on 03/31/2016    . buPROPion (WELLBUTRIN XL) 300 MG 24 hr tablet Take 1 tablet (300 mg total) by mouth daily. 90 tablet 0  . ergocalciferol (DRISDOL) 50000 units capsule Take 1 capsule (50,000 Units total) once a week by mouth. 4 capsule 3  . hydrochlorothiazide (HYDRODIURIL) 25 MG tablet Take 1 tablet (25 mg total) by mouth daily. 90 tablet 1  . losartan (COZAAR) 50 MG tablet TAKE 1 TABLET (50 MG TOTAL) BY MOUTH DAILY. 90 tablet 1  . tadalafil (CIALIS) 20 MG tablet Take 0.5-1 tablets (10-20 mg total) every other day as needed by mouth for erectile dysfunction. 5 tablet 11   No current facility-administered medications for this visit.     Objective: BP 134/78 (BP Location: Right Arm, Patient Position: Sitting, Cuff Size: Large)   Pulse (!) 139   Temp (!) 103 F (39.4 C) (Oral)   Ht 5\' 10"  (1.778 m)   Wt 227 lb 12.8 oz (103.3 kg)   SpO2 94%   BMI 32.69 kg/m  Gen: NAD, appears fatigued HEENT: Turbinates erythematous, TM normal, pharynx  erythematous with no tonsilar exudate or edema, + sinus tenderness CV: RRR no murmurs rubs or gallops Lungs: CTAB no crackles, wheeze, rhonchi Abdomen: soft/nontender/nondistended/normal bowel sounds. Ext: no edema Skin: warm, dry, no rash Lymph: + anterior cervical lymphadenopathy  Assessment/Plan:  Flu-like illness:  Retake of HR: 102, Patient is febrile and also reported feeling anxious when arriving.  Retake of HR has shown improvement.  Last dose of  acetaminophen was before bed last night. Patients influenza test was positive for A.  Rapid Strep is also positive.  Patient will be treated with Tamiflu. Prophylaxis for other [facility] patients: No, patient lives alone  Initiate amoxicillin for strep and advised patient on supportive measures:  Get rest, drink plenty of fluids, and use tylenol or ibuprofen as needed for pain  and fever. Discussed the importance of not exceeding acetaminophen dose on package and to avoid combination OTC cold medicines which can have acetaminophen added. Advised follow up if symptoms worsen or if symptoms are not improving in 3 to 4 days.    Finally, we reviewed reasons to return to care including if symptoms worsen or persist or new concerns arise.    Laurita Quint, FNP

## 2017-11-17 NOTE — Patient Instructions (Signed)
Please take medication as directed. If symptoms do not improve with treatment, worsen, or you develop new symptoms, please follow up for further evaluation and treatment.  Please drink plenty of water so that your urine is pale yellow or clear. Also, get plenty of rest, use tylenol or ibuprofen as needed for discomfort and do not exceed dosage on package. Also, please be aware that combination cold medicines can also include acetaminophen and please avoid these.    Strep Throat Strep throat is an infection of the throat. It is caused by germs. Strep throat spreads from person to person because of coughing, sneezing, or close contact. Follow these instructions at home: Medicines  Take over-the-counter and prescription medicines only as told by your doctor.  Take your antibiotic medicine as told by your doctor. Do not stop taking the medicine even if you feel better.  Have family members who also have a sore throat or fever go to a doctor. Eating and drinking  Do not share food, drinking cups, or personal items.  Try eating soft foods until your sore throat feels better.  Drink enough fluid to keep your pee (urine) clear or pale yellow. General instructions  Rinse your mouth (gargle) with a salt-water mixture 3-4 times per day or as needed. To make a salt-water mixture, stir -1 tsp of salt into 1 cup of warm water.  Make sure that all people in your house wash their hands well.  Rest.  Stay home from school or work until you have been taking antibiotics for 24 hours.  Keep all follow-up visits as told by your doctor. This is important. Contact a doctor if:  Your neck keeps getting bigger.  You get a rash, cough, or earache.  You cough up thick liquid that is green, yellow-brown, or bloody.  You have pain that does not get better with medicine.  Your problems get worse instead of getting better.  You have a fever. Get help right away if:  You throw up (vomit).  You get  a very bad headache.  You neck hurts or it feels stiff.  You have chest pain or you are short of breath.  You have drooling, very bad throat pain, or changes in your voice.  Your neck is swollen or the skin gets red and tender.  Your mouth is dry or you are peeing less than normal.  You keep feeling more tired or it is hard to wake up.  Your joints are red or they hurt. This information is not intended to replace advice given to you by your health care provider. Make sure you discuss any questions you have with your health care provider. Document Released: 03/15/2008 Document Revised: 05/26/2016 Document Reviewed: 01/20/2015 Elsevier Interactive Patient Education  2018 [employer].  Influenza, Adult Influenza ("the flu") is an infection in the lungs, nose, and throat (respiratory tract). It is caused by a virus. The flu causes many common cold symptoms, as well as a high fever and body aches. It can make you feel very sick. The flu spreads easily from person to person (is contagious). Getting a flu shot (influenza vaccination) every year is the best way to prevent the flu. Follow these instructions at home:  Take over-the-counter and prescription medicines only as told by your doctor.  Use a cool mist humidifier to add moisture (humidity) to the air in your home. This can make it easier to breathe.  Rest as needed.  Drink enough fluid to keep your pee (urine)  clear or pale yellow.  Cover your mouth and nose when you cough or sneeze.  Wash your hands with soap and water often, especially after you cough or sneeze. If you cannot use soap and water, use hand sanitizer.  Stay home from work or school as told by your doctor. Unless you are visiting your doctor, try to avoid leaving home until your fever has been gone for 24 hours without the use of medicine.  Keep all follow-up visits as told by your doctor. This is important. How is this prevented?  Getting a yearly (annual)  flu shot is the best way to avoid getting the flu. You may get the flu shot in late summer, fall, or winter. Ask your doctor when you should get your flu shot.  Wash your hands often or use hand sanitizer often.  Avoid contact with people who are sick during cold and flu season.  Eat healthy foods.  Drink plenty of fluids.  Get enough sleep.  Exercise regularly. Contact a doctor if:  You get new symptoms.  You have: ? Chest pain. ? Watery poop (diarrhea). ? A fever.  Your cough gets worse.  You start to have more mucus.  You feel sick to your stomach (nauseous).  You throw up (vomit). Get help right away if:  You start to be short of breath or have trouble breathing.  Your skin or nails turn a bluish color.  You have very bad pain or stiffness in your neck.  You get a sudden headache.  You get sudden pain in your face or ear.  You cannot stop throwing up. This information is not intended to replace advice given to you by your health care provider. Make sure you discuss any questions you have with your health care provider. Document Released: 07/06/2008 Document Revised: 03/04/2016 Document Reviewed: 07/22/2015 Elsevier Interactive Patient Education  2017 [employer].

## 2017-12-01 ENCOUNTER — Other Ambulatory Visit: Payer: Self-pay | Admitting: Internal Medicine

## 2017-12-14 ENCOUNTER — Other Ambulatory Visit: Payer: Self-pay | Admitting: *Deleted

## 2017-12-14 MED ORDER — ERGOCALCIFEROL 1.25 MG (50000 UT) PO CAPS: 50000.0000 [IU] | ORAL_CAPSULE | ORAL | 3 refills | Status: DC

## 2017-12-19 ENCOUNTER — Other Ambulatory Visit: Payer: Self-pay | Admitting: Internal Medicine

## 2018-01-01 ENCOUNTER — Other Ambulatory Visit: Payer: Self-pay | Admitting: Internal Medicine

## 2018-01-02 NOTE — Telephone Encounter (Signed)
Refilled: 08/17/2017 Last OV: 08/17/2017 Next OV: 02/14/2018

## 2018-01-03 NOTE — Telephone Encounter (Signed)
Printed, signed and faxed.  

## 2018-02-14 ENCOUNTER — Ambulatory Visit: Payer: BLUE CROSS/BLUE SHIELD | Admitting: Internal Medicine

## 2018-02-23 ENCOUNTER — Telehealth: Payer: Self-pay | Admitting: Internal Medicine

## 2018-02-23 MED ORDER — OMEPRAZOLE 40 MG PO CPDR: 40.0000 mg | DELAYED_RELEASE_CAPSULE | Freq: Every day | ORAL | 5 refills | Status: DC

## 2018-02-23 NOTE — Telephone Encounter (Signed)
Spoke with Felix Pacini, Pharmacist at [facility] regarding refill request; she states that the original prescription was written Dr Keith Rake, [place], [place] and the original request was denied because the prescribing MD stated it should come from primary provider; the original prescription was written on 01/26/16, #30, 3 refills; will route to office for final disposition.

## 2018-02-23 NOTE — Telephone Encounter (Signed)
Copied from [facility]. Topic: Quick Communication - Rx Refill/Question >> Feb 23, 2018  1:05 PM Synthia Innocent wrote: Medication: omeprazole 40mg  1 tab daily, 90 day supply Has the patient contacted their pharmacy? Yes.   (Agent: If no, request that the patient contact the pharmacy for the refill.) Preferred Pharmacy (with phone number or street name): [facility] Agent: Please be advised that RX refills may take up to 3 business days. We ask that you follow-up with your pharmacy.

## 2018-02-23 NOTE — Telephone Encounter (Signed)
Patient saw Dr. Derrel Nip last October and does not have a scheduled appt. Also this medication is not listed on his current medication or past medication list.

## 2018-02-23 NOTE — Telephone Encounter (Signed)
Not in current med list  Omeprazole 40mg   Last OV: 08/17/2017 Next OV: not scheduled

## 2018-02-23 NOTE — Telephone Encounter (Signed)
Omeprazole refilled.

## 2018-03-20 ENCOUNTER — Telehealth: Payer: Self-pay

## 2018-03-20 NOTE — Telephone Encounter (Signed)
Spoke with pt and was able to schedule him an appt with Dr. Derrel Nip for Wednesday at 1:30pm.

## 2018-03-20 NOTE — Telephone Encounter (Signed)
Copied from [place] [id number]. Topic: Appointment Scheduling - Scheduling Inquiry for Clinic >> Mar 20, 2018  8:44 AM Synthia Innocent wrote: Reason for CRM: Cancel 6 mth follow up from May, requesting to be seen in June, no appts available. Please advise.

## 2018-03-20 NOTE — Telephone Encounter (Signed)
Yes

## 2018-03-20 NOTE — Telephone Encounter (Signed)
LMTCB. Please transfer pt to our office.  

## 2018-03-20 NOTE — Telephone Encounter (Signed)
Pt had to cancel his appt in May and is wanting to be seen in June. No available appts other than same day. Is it okay to use one of the same day appts or an 11:30 appt?

## 2018-03-22 ENCOUNTER — Ambulatory Visit (INDEPENDENT_AMBULATORY_CARE_PROVIDER_SITE_OTHER): Payer: Self-pay | Admitting: Internal Medicine

## 2018-03-22 ENCOUNTER — Encounter: Payer: Self-pay | Admitting: Internal Medicine

## 2018-03-22 VITALS — BP 146/80 | HR 75 | Temp 98.0°F | Ht 70.0 in | Wt 228.8 lb

## 2018-03-22 DIAGNOSIS — M130 Polyarthritis, unspecified: Secondary | ICD-10-CM

## 2018-03-22 DIAGNOSIS — E559 Vitamin D deficiency, unspecified: Secondary | ICD-10-CM

## 2018-03-22 DIAGNOSIS — N401 Enlarged prostate with lower urinary tract symptoms: Secondary | ICD-10-CM

## 2018-03-22 DIAGNOSIS — N529 Male erectile dysfunction, unspecified: Secondary | ICD-10-CM

## 2018-03-22 DIAGNOSIS — E785 Hyperlipidemia, unspecified: Secondary | ICD-10-CM

## 2018-03-22 DIAGNOSIS — I10 Essential (primary) hypertension: Secondary | ICD-10-CM

## 2018-03-22 DIAGNOSIS — E538 Deficiency of other specified B group vitamins: Secondary | ICD-10-CM

## 2018-03-22 DIAGNOSIS — R350 Frequency of micturition: Secondary | ICD-10-CM

## 2018-03-22 LAB — COMPREHENSIVE METABOLIC PANEL
ALBUMIN: 3.9 g/dL (ref 3.5–5.2)
ALT: 25 U/L (ref 0–53)
AST: 23 U/L (ref 0–37)
Alkaline Phosphatase: 71 U/L (ref 39–117)
BILIRUBIN TOTAL: 0.3 mg/dL (ref 0.2–1.2)
BUN: 13 mg/dL (ref 6–23)
CALCIUM: 9.2 mg/dL (ref 8.4–10.5)
CO2: 29 meq/L (ref 19–32)
CREATININE: 1.25 mg/dL (ref 0.40–1.50)
Chloride: 102 mEq/L (ref 96–112)
GFR: 61.98 mL/min (ref >60.00)
Glucose, Bld: 85 mg/dL (ref 70–99)
Potassium: 3.8 mEq/L (ref 3.5–5.1)
Sodium: 138 mEq/L (ref 135–145)
Total Protein: 6.3 g/dL (ref 6.0–8.3)

## 2018-03-22 LAB — LIPID PANEL
Cholesterol: 193 mg/dL (ref 0–200)
HDL: 33.9 mg/dL — ABNORMAL LOW (ref >39.00)
NONHDL: 159.16
Total CHOL/HDL Ratio: 6
Triglycerides: 279 mg/dL — ABNORMAL HIGH (ref 0.0–149.0)
VLDL: 55.8 mg/dL — ABNORMAL HIGH (ref 0.0–40.0)

## 2018-03-22 LAB — VITAMIN D 25 HYDROXY (VIT D DEFICIENCY, FRACTURES): VITD: 29.79 ng/mL — ABNORMAL LOW (ref 30.00–100.00)

## 2018-03-22 LAB — VITAMIN B12: VITAMIN B 12: 342 pg/mL (ref 211–911)

## 2018-03-22 LAB — LDL CHOLESTEROL, DIRECT: LDL DIRECT: 109 mg/dL

## 2018-03-22 MED ORDER — SILDENAFIL CITRATE 20 MG PO TABS: 20.0000 mg | ORAL_TABLET | Freq: Three times a day (TID) | ORAL | 0 refills | Status: DC

## 2018-03-22 MED ORDER — TRAMADOL HCL 50 MG PO TABS: 50.0000 mg | ORAL_TABLET | Freq: Three times a day (TID) | ORAL | 0 refills | Status: DC | PRN

## 2018-03-22 MED ORDER — TAMSULOSIN HCL 0.4 MG PO CAPS: 0.4000 mg | ORAL_CAPSULE | Freq: Every day | ORAL | 3 refills | Status: DC

## 2018-03-22 NOTE — Progress Notes (Signed)
Subjective:  Patient ID: Dustin [name], male    DOB: 1956/03/08  Age: 62 y.o. MRN: [id number]  CC: The primary encounter diagnosis was Vitamin D deficiency. Diagnoses of Hyperlipidemia LDL goal <100, B12 deficiency, Polyarthritis, Essential hypertension, benign, ERECTILE DYSFUNCTION, ORGANIC, and Benign prostatic hyperplasia with urinary frequency were also pertinent to this visit.  HPI Dustin [name] presents for 6 month follow up on hypertension, hyperlipidemia,  GAD , Vitamin D and Vitamin B12 deficiency  vitamin d deficiency  Still taking hte megadose since last check.  No longer taking B12 supplements.     Home bp readings have been measured weekly ahd have averaged 130/80 or less .   Right hip pain did not respond to steroid injection at [facility].  Told he may have a Bone spur vs piriformis syndrome  Trial of tramadol  Discussed since his pain is constant.   Right mandibular pain ,  Has seen dentist and workup underway.  No signs of abscess        Outpatient Medications Prior to Visit  Medication Sig Dispense Refill  . ALPRAZolam (XANAX) 0.25 MG tablet TAKE 1 TABLET BY MOUTH TWICE A DAY AS NEEDED FOR ANXIETY 30 tablet 2  . budesonide (RHINOCORT ALLERGY) 32 MCG/ACT nasal spray Place 2 sprays into both nostrils daily. Reported on 03/31/2016    . losartan (COZAAR) 50 MG tablet TAKE 1 TABLET (50 MG TOTAL) BY MOUTH DAILY. 90 tablet 1  . tadalafil (CIALIS) 20 MG tablet Take 0.5-1 tablets (10-20 mg total) every other day as needed by mouth for erectile dysfunction. 5 tablet 11  . amoxicillin (AMOXIL) 500 MG capsule Take 1 capsule (500 mg total) by mouth 2 (two) times daily. 20 capsule 0  . buPROPion (WELLBUTRIN XL) 300 MG 24 hr tablet TAKE 1 TABLET BY MOUTH EVERY DAY 90 tablet 1  . ergocalciferol (DRISDOL) 50000 units capsule Take 1 capsule (50,000 Units total) by mouth once a week. 4 capsule 3  . hydrochlorothiazide (HYDRODIURIL) 25 MG tablet Take 1 tablet (25 mg total) by mouth  daily. 90 tablet 1  . omeprazole (PRILOSEC) 40 MG capsule Take 1 capsule (40 mg total) by mouth daily. 30 capsule 5  . oseltamivir (TAMIFLU) 75 MG capsule Take 1 capsule (75 mg total) by mouth 2 (two) times daily. 10 capsule 0   No facility-administered medications prior to visit.     Review of Systems;  Patient denies headache, fevers, malaise, unintentional weight loss, skin rash, eye pain, sinus congestion and sinus pain, sore throat, dysphagia,  hemoptysis , cough, dyspnea, wheezing, chest pain, palpitations, orthopnea, edema, abdominal pain, nausea, melena, diarrhea, constipation, flank pain, dysuria, hematuria, urinary  Frequency, nocturia, numbness, tingling, seizures,  Focal weakness, Loss of consciousness,  Tremor, insomnia, depression, anxiety, and suicidal ideation.      Objective:  BP (!) 146/80   Pulse 75   Temp 98 F (36.7 C) (Oral)   Ht 5\' 10"  (1.778 m)   Wt 228 lb 12.8 oz (103.8 kg)   SpO2 94%   BMI 32.83 kg/m   BP Readings from Last 3 Encounters:  03/22/18 (!) 146/80  11/17/17 134/78  08/17/17 120/64    Wt Readings from Last 3 Encounters:  03/22/18 228 lb 12.8 oz (103.8 kg)  11/17/17 227 lb 12.8 oz (103.3 kg)  08/17/17 234 lb 6.4 oz (106.3 kg)    General appearance: alert, cooperative and appears stated age Ears: normal TM's and external ear canals both ears Throat: lips, mucosa,  and tongue normal; teeth and gums normal Neck: no adenopathy, no carotid bruit, supple, symmetrical, trachea midline and thyroid not enlarged, symmetric, no tenderness/mass/nodules Back: symmetric, no curvature. ROM normal. No CVA tenderness. Lungs: clear to auscultation bilaterally Heart: regular rate and rhythm, S1, S2 normal, no murmur, click, rub or gallop Abdomen: soft, non-tender; bowel sounds normal; no masses,  no organomegaly Pulses: 2+ and symmetric Skin: Skin color, texture, turgor normal. No rashes or lesions Lymph nodes: Cervical, supraclavicular, and axillary nodes  normal.  No results found for: HGBA1C  Lab Results  Component Value Date   CREATININE 1.25 03/22/2018   CREATININE 1.11 08/17/2017   CREATININE 1.24 11/18/2016    Lab Results  Component Value Date   WBC 7.7 08/17/2017   HGB 15.2 08/17/2017   HCT 44.9 08/17/2017   PLT 211.0 08/17/2017   GLUCOSE 85 03/22/2018   CHOL 193 03/22/2018   TRIG 279.0 (H) 03/22/2018   HDL 33.90 (L) 03/22/2018   LDLDIRECT 109.0 03/22/2018   LDLCALC 122 (H) 12/23/2014   ALT 25 03/22/2018   AST 23 03/22/2018   NA 138 03/22/2018   K 3.8 03/22/2018   CL 102 03/22/2018   CREATININE 1.25 03/22/2018   BUN 13 03/22/2018   CO2 29 03/22/2018   TSH 3.11 11/18/2016   PSA 0.78 08/17/2017   MICROALBUR <0.7 08/17/2017    [place] Retroperitoneal Comp  Result Date: 11/29/2016 CLINICAL DATA:  62 year old male with a family history of abdominal aortic aneurysm and cardiovascular risk factors including smoking and hypertension EXAM: ULTRASOUND RETROPERITONEAL COMPLETE TECHNIQUE: Ultrasound examination of the abdominal aorta was performed to evaluate for abdominal aortic aneurysm. The common iliac arteries, IVC, and kidneys were also evaluated. COMPARISON:  CT abdomen 12/09/2006 FINDINGS: Abdominal aorta: Maximum diameter 3.1 cm Right common iliac:  1.1 cm Left common iliac: 1.0 cm IVC No abnormality visualized. Right Kidney Length: 11.0 cm Echogenicity within normal limits. No mass or hydronephrosis visualized. Left Kidney Length: 11.5 cm anechoic lesion with through transmission at the inferior cortex of the left kidney measures 1.5 cm x 1.7 cm x 1.5 cm. No internal flow. IMPRESSION: Ultrasound survey of the aorta demonstrates a diameter of 3.1 cm in the upper abdomen. Recommend followup by ultrasound in 3 years. This recommendation follows ACR consensus guidelines: White Paper of the ACR Incidental Findings Committee II on Vascular Findings. [facility]iol 2013; [phone] Left kidney cyst. Signed, Dulcy Fanny. Earleen Newport, DO [facility] Radiology Electronically Signed   By: Corrie Mckusick D.O.   On: 11/29/2016 08:58    Assessment & Plan:   Problem List Items Addressed This Visit    Vitamin D deficiency - Primary    lvevel is barely normal on megadose.  Will continue weekly supplementation       Relevant Orders   VITAMIN D 25 Hydroxy (Vit-D Deficiency, Fractures) (Completed)   Polyarthritis    No evidence of autoimmune etiologies.  Given his need to avoid daily use of NSAIDS  (Htn, CVI)  , trial of tramadol for prn use       Hyperlipidemia LDL goal <100    LDL is close to goal but HFL and trigs are off.  Discussed need for low GI diet and regular exercise  Lab Results  Component Value Date   CHOL 193 03/22/2018   HDL 33.90 (L) 03/22/2018   LDLCALC 122 (H) 12/23/2014   LDLDIRECT 109.0 03/22/2018   TRIG 279.0 (H) 03/22/2018   CHOLHDL 6 03/22/2018  Relevant Medications   sildenafil (REVATIO) 20 MG tablet   hydrochlorothiazide (HYDRODIURIL) 25 MG tablet   Other Relevant Orders   Comprehensive metabolic panel (Completed)   Lipid panel (Completed)   Essential hypertension, benign    Well controlled on current regimen. Renal function stable, no changes today.  Lab Results  Component Value Date   CREATININE 1.25 03/22/2018   Lab Results  Component Value Date   NA 138 03/22/2018   K 3.8 03/22/2018   CL 102 03/22/2018   CO2 29 03/22/2018         Relevant Medications   sildenafil (REVATIO) 20 MG tablet   hydrochlorothiazide (HYDRODIURIL) 25 MG tablet   ERECTILE DYSFUNCTION, ORGANIC    Generic Viagra 20 mg dose written.       BPH (benign prostatic hyperplasia)    cialis (daily) too expensive.  Trial of Flomax/       Relevant Medications   tamsulosin (FLOMAX) 0.4 MG CAPS capsule   B12 deficiency    Found during workup for fatigue.  Repeat level is normal and intrinsic factor ab titer is negative . Likely secondary to chronic PPI use.    Lab Results  Component Value Date   [id number] 505 03/22/2018         Relevant Orders   Vitamin B12 (Completed)     A total of 40 minutes of face to face time was spent with patient more than half of which was spent in counselling about the above mentioned conditions  and coordination of care  I have discontinued Dustin [name]'s amoxicillin and oseltamivir. I have also changed his buPROPion. Additionally, I am having him start on traMADol, tamsulosin, and sildenafil. Lastly, I am having him maintain his budesonide, tadalafil, losartan, ALPRAZolam, ergocalciferol, hydrochlorothiazide, and omeprazole.  Meds ordered this encounter  Medications  . traMADol (ULTRAM) 50 MG tablet    Sig: Take 1 tablet (50 mg total) by mouth every 8 (eight) hours as needed.    Dispense:  30 tablet    Refill:  0  . tamsulosin (FLOMAX) 0.4 MG CAPS capsule    Sig: Take 1 capsule (0.4 mg total) by mouth daily.    Dispense:  30 capsule    Refill:  3  . sildenafil (REVATIO) 20 MG tablet    Sig: Take 1 tablet (20 mg total) by mouth 3 (three) times daily.    Dispense:  30 tablet    Refill:  0  . ergocalciferol (DRISDOL) 50000 units capsule    Sig: Take 1 capsule (50,000 Units total) by mouth once a week.    Dispense:  4 capsule    Refill:  11  . hydrochlorothiazide (HYDRODIURIL) 25 MG tablet    Sig: Take 1 tablet (25 mg total) by mouth daily.    Dispense:  90 tablet    Refill:  1  . buPROPion (WELLBUTRIN XL) 300 MG 24 hr tablet    Sig: Take 1 tablet (300 mg total) by mouth daily.    Dispense:  90 tablet    Refill:  2  . omeprazole (PRILOSEC) 40 MG capsule    Sig: Take 1 capsule (40 mg total) by mouth daily.    Dispense:  90 capsule    Refill:  2    Medications Discontinued During This Encounter  Medication Reason  . amoxicillin (AMOXIL) 500 MG capsule Patient has not taken in last 30 days  . oseltamivir (TAMIFLU) 75 MG capsule Patient has not taken in last 30 days  .  ergocalciferol (DRISDOL)  50000 units capsule Reorder  . hydrochlorothiazide (HYDRODIURIL) 25 MG tablet Reorder  . buPROPion (WELLBUTRIN XL) 300 MG 24 hr tablet Reorder  . omeprazole (PRILOSEC) 40 MG capsule     Follow-up: Return in about 6 months (around 09/21/2018).   Crecencio Mc, MD

## 2018-03-22 NOTE — Progress Notes (Signed)
Pre visit review using our clinic review tool, if applicable. No additional management support is needed unless otherwise documented below in the visit note. 

## 2018-03-22 NOTE — Patient Instructions (Signed)
Trial of tramadol for pain .  50 mg every 6 hours or 100 mg every 12 hrs.  Can be combined with tylenol or motrin or both   Change cialis  Daily toL  flomax daily (for BPH) Generic viagra  As needed for ED (20 mg tablets,  Can take 2-3 as needed)

## 2018-03-24 ENCOUNTER — Other Ambulatory Visit: Payer: Self-pay | Admitting: Internal Medicine

## 2018-03-24 MED ORDER — HYDROCHLOROTHIAZIDE 25 MG PO TABS: 25.0000 mg | ORAL_TABLET | Freq: Every day | ORAL | 1 refills | Status: DC

## 2018-03-24 MED ORDER — OMEPRAZOLE 40 MG PO CPDR: 40.0000 mg | DELAYED_RELEASE_CAPSULE | Freq: Every day | ORAL | 2 refills | Status: DC

## 2018-03-24 MED ORDER — ERGOCALCIFEROL 1.25 MG (50000 UT) PO CAPS: 50000.0000 [IU] | ORAL_CAPSULE | ORAL | 11 refills | Status: DC

## 2018-03-24 MED ORDER — BUPROPION HCL ER (XL) 300 MG PO TB24: 300.0000 mg | ORAL_TABLET | Freq: Every day | ORAL | 2 refills | Status: DC

## 2018-03-24 NOTE — Assessment & Plan Note (Signed)
Generic Viagra 20 mg dose written.

## 2018-03-24 NOTE — Assessment & Plan Note (Signed)
No evidence of autoimmune etiologies.  Given his need to avoid daily use of NSAIDS  (Htn, CVI)  , trial of tramadol for prn use

## 2018-03-24 NOTE — Assessment & Plan Note (Signed)
Well controlled on current regimen. Renal function stable, no changes today.  Lab Results  Component Value Date   CREATININE 1.25 03/22/2018   Lab Results  Component Value Date   NA 138 03/22/2018   K 3.8 03/22/2018   CL 102 03/22/2018   CO2 29 03/22/2018

## 2018-03-24 NOTE — Assessment & Plan Note (Addendum)
Found during workup for fatigue.  Repeat level is normal and intrinsic factor ab titer is negative . Likely secondary to chronic PPI use.  Lab Results  Component Value Date   [id number] 878 03/22/2018

## 2018-03-24 NOTE — Assessment & Plan Note (Signed)
cialis (daily) too expensive.  Trial of Flomax/

## 2018-03-24 NOTE — Assessment & Plan Note (Signed)
lvevel is barely normal on megadose.  Will continue weekly supplementation

## 2018-03-24 NOTE — Assessment & Plan Note (Signed)
LDL is close to goal but HFL and trigs are off.  Discussed need for low GI diet and regular exercise  Lab Results  Component Value Date   CHOL 193 03/22/2018   HDL 33.90 (L) 03/22/2018   LDLCALC 122 (H) 12/23/2014   LDLDIRECT 109.0 03/22/2018   TRIG 279.0 (H) 03/22/2018   CHOLHDL 6 03/22/2018

## 2018-04-01 ENCOUNTER — Other Ambulatory Visit: Payer: Self-pay | Admitting: Internal Medicine

## 2018-05-04 ENCOUNTER — Other Ambulatory Visit: Payer: Self-pay | Admitting: Internal Medicine

## 2018-05-15 DIAGNOSIS — M19049 Primary osteoarthritis, unspecified hand: Secondary | ICD-10-CM | POA: Insufficient documentation

## 2018-07-10 ENCOUNTER — Telehealth: Payer: Self-pay

## 2018-07-10 NOTE — Telephone Encounter (Signed)
Yes he has to be seen . [id number]:30u can use an 11:30 or a 4:30,

## 2018-07-10 NOTE — Telephone Encounter (Signed)
Copied from [place] [phone]. Topic: Referral - Request >> Jul 10, 2018 12:04 PM Lennox Solders wrote: Reason for CRM:pt is calling and would like a referral to pain management for back and right leg pain. Pt is self pay

## 2018-07-10 NOTE — Telephone Encounter (Signed)
Does pt need to schedule an appt first?

## 2018-07-12 NOTE — Telephone Encounter (Signed)
Called pt to let pt know that he would need to schedule an appt with Dr. Derrel Nip before we could place a referral. Before I could let him know anything he asked me is I thought this phone call was "three days to late". I apologized to the pt and explained to the pt that our policy is to respond to a message within 48 to 72 hours and told the pt that he called two days ago. The pt then said "I have already taken care of this". I apologized to the pt once again.

## 2018-07-13 ENCOUNTER — Other Ambulatory Visit: Payer: Self-pay | Admitting: Anesthesiology

## 2018-07-13 ENCOUNTER — Ambulatory Visit
Admission: RE | Admit: 2018-07-13 | Discharge: 2018-07-13 | Disposition: A | Discharge status: Home / Self Care | Payer: Self-pay | Source: Ambulatory Visit | Attending: Anesthesiology | Admitting: Anesthesiology

## 2018-07-13 ENCOUNTER — Ambulatory Visit (HOSPITAL_BASED_OUTPATIENT_CLINIC_OR_DEPARTMENT_OTHER): Payer: Self-pay | Admitting: Anesthesiology

## 2018-07-13 ENCOUNTER — Telehealth: Payer: Self-pay | Admitting: *Deleted

## 2018-07-13 ENCOUNTER — Encounter: Payer: Self-pay | Admitting: Anesthesiology

## 2018-07-13 ENCOUNTER — Other Ambulatory Visit: Payer: Self-pay

## 2018-07-13 VITALS — BP 141/86 | HR 91 | Temp 98.0°F | Resp 16 | Ht 69.5 in | Wt 224.0 lb

## 2018-07-13 DIAGNOSIS — G8929 Other chronic pain: Secondary | ICD-10-CM

## 2018-07-13 DIAGNOSIS — R52 Pain, unspecified: Secondary | ICD-10-CM

## 2018-07-13 DIAGNOSIS — Z79899 Other long term (current) drug therapy: Secondary | ICD-10-CM | POA: Insufficient documentation

## 2018-07-13 DIAGNOSIS — M5136 Other intervertebral disc degeneration, lumbar region: Secondary | ICD-10-CM

## 2018-07-13 DIAGNOSIS — M25551 Pain in right hip: Secondary | ICD-10-CM

## 2018-07-13 DIAGNOSIS — M5441 Lumbago with sciatica, right side: Secondary | ICD-10-CM

## 2018-07-13 DIAGNOSIS — M5431 Sciatica, right side: Secondary | ICD-10-CM

## 2018-07-13 DIAGNOSIS — M5116 Intervertebral disc disorders with radiculopathy, lumbar region: Secondary | ICD-10-CM | POA: Insufficient documentation

## 2018-07-13 MED ORDER — SODIUM CHLORIDE 0.9 % IJ SOLN
INTRAMUSCULAR | Status: AC
  Filled 2018-07-13: qty 10

## 2018-07-13 MED ORDER — LIDOCAINE HCL (PF) 1 % IJ SOLN
INTRAMUSCULAR | Status: AC
  Filled 2018-07-13: qty 5

## 2018-07-13 MED ORDER — SODIUM CHLORIDE 0.9% FLUSH
10.0000 mL | Freq: Once | INTRAVENOUS | Status: AC
  Administered 2018-07-13: 5 mL

## 2018-07-13 MED ORDER — LIDOCAINE HCL (PF) 1 % IJ SOLN: 5.0000 mL | Freq: Once | INTRAMUSCULAR | Status: DC

## 2018-07-13 MED ORDER — LIDOCAINE HCL (PF) 1 % IJ SOLN
5.0000 mL | Freq: Once | INTRAMUSCULAR | Status: AC
  Administered 2018-07-13: 10 mL via SUBCUTANEOUS

## 2018-07-13 MED ORDER — ROPIVACAINE HCL 2 MG/ML IJ SOLN
INTRAMUSCULAR | Status: AC
  Filled 2018-07-13: qty 10

## 2018-07-13 MED ORDER — IOPAMIDOL (ISOVUE-M 200) INJECTION 41%
INTRAMUSCULAR | Status: AC
  Filled 2018-07-13: qty 10

## 2018-07-13 MED ORDER — IOPAMIDOL (ISOVUE-M 200) INJECTION 41%
20.0000 mL | Freq: Once | INTRAMUSCULAR | Status: DC | PRN
  Administered 2018-07-13: 10 mL
  Filled 2018-07-13: qty 20

## 2018-07-13 MED ORDER — LACTATED RINGERS IV SOLN: 1000.0000 mL | INTRAVENOUS | Status: DC

## 2018-07-13 MED ORDER — MIDAZOLAM HCL 2 MG/2ML IJ SOLN: 5.0000 mg | Freq: Once | INTRAMUSCULAR | Status: DC

## 2018-07-13 MED ORDER — TRIAMCINOLONE ACETONIDE 40 MG/ML IJ SUSP
40.0000 mg | Freq: Once | INTRAMUSCULAR | Status: AC
  Administered 2018-07-13: 40 mg

## 2018-07-13 MED ORDER — TRIAMCINOLONE ACETONIDE 40 MG/ML IJ SUSP
INTRAMUSCULAR | Status: AC
  Filled 2018-07-13: qty 1

## 2018-07-13 MED ORDER — ROPIVACAINE HCL 2 MG/ML IJ SOLN
10.0000 mL | Freq: Once | INTRAMUSCULAR | Status: AC
  Administered 2018-07-13: 1 mL via EPIDURAL

## 2018-07-13 NOTE — Progress Notes (Signed)
Safety precautions to be maintained throughout the outpatient stay will include: orient to surroundings, keep bed in low position, maintain call [place] within reach at all times, provide assistance with transfer out of bed and ambulation.  

## 2018-07-14 ENCOUNTER — Telehealth: Payer: Self-pay | Admitting: *Deleted

## 2018-07-14 NOTE — Telephone Encounter (Signed)
Callback re; procedure.  States the pain is better and he is hoping that it will continue to improve.

## 2018-07-14 NOTE — Progress Notes (Signed)
Subjective:  Patient ID: Dustin [name], male    DOB: [birth year]  Age: 62 y.o. MRN: [id number]  CC: Groin Pain (right); Leg Pain (right); and Pain (right buttock)   Procedure: L4-5 epidural steroid under fluoroscopic guidance without sedation  HPI Dustin [name] presents for new patient evaluation.  He is describing a pain syndrome that has involved the right hip with a pain that radiates into the right anterior groin right anterior thigh and occasionally into the right distal tibial region.  This pain is been going on for several weeks and he has been seen by an orthopedist for evaluation and treatment.  He is previously had to hip injections that have given him minimal relief and this pain has persisted.  He is also noticed some intermittent weakness primarily of a pain associated that bothers him in the right anterior thigh.  He periodically feels like his leg is going to give out.  He denies problems with fevers chills bowel or bladder dysfunction.  He feels that it he gets better pain relief when he is standing or laying flat and the pain is substantially worse with prolonged sitting such as driving a car.  Unfortunately despite conservative management he has been unable to get any significant relief with the pain.  It keeps him up at night.  He has had recent x-rays that show some degenerative joint disease of the lumbar spine but he has not had a previous MRI.  He reports today for a new patient evaluation and has seen his orthopedist in the past few days with recommendations for an epidural steroid injection.  Outpatient Medications Prior to Visit  Medication Sig Dispense Refill  . buPROPion (WELLBUTRIN XL) 300 MG 24 hr tablet Take 1 tablet (300 mg total) by mouth daily. 90 tablet 2  . ergocalciferol (DRISDOL) 50000 units capsule Take 1 capsule (50,000 Units total) by mouth once a week. 4 capsule 11  . hydrochlorothiazide (HYDRODIURIL) 25 MG tablet Take 1 tablet (25 mg total) by mouth  daily. 90 tablet 1  . losartan (COZAAR) 50 MG tablet TAKE 1 TABLET BY MOUTH EVERY DAY 90 tablet 1  . omeprazole (PRILOSEC) 40 MG capsule Take 1 capsule (40 mg total) by mouth daily. 90 capsule 2  . sildenafil (REVATIO) 20 MG tablet Take 1 tablet (20 mg total) by mouth 3 (three) times daily. 30 tablet 0  . tadalafil (ADCIRCA/CIALIS) 20 MG tablet TAKE 0.5-1 TABLETS (10-20 MG TOTAL) EVERY OTHER DAY AS NEEDED BY MOUTH FOR ERECTILE DYSFUNCTION. 15 tablet 3  . ALPRAZolam (XANAX) 0.25 MG tablet TAKE 1 TABLET BY MOUTH TWICE A DAY AS NEEDED FOR ANXIETY (Patient not taking: Reported on 07/13/2018) 30 tablet 2  . budesonide (RHINOCORT ALLERGY) 32 MCG/ACT nasal spray Place 2 sprays into both nostrils daily. Reported on 03/31/2016    . tamsulosin (FLOMAX) 0.4 MG CAPS capsule Take 1 capsule (0.4 mg total) by mouth daily. (Patient not taking: Reported on 07/13/2018) 30 capsule 3  . traMADol (ULTRAM) 50 MG tablet Take 1 tablet (50 mg total) by mouth every 8 (eight) hours as needed. (Patient not taking: Reported on 07/13/2018) 30 tablet 0   No facility-administered medications prior to visit.     Review of Systems CNS: No confusion or sedation Cardiac: No angina or palpitations GI: No abdominal pain or constipation Constitutional: No nausea vomiting fevers or chills  Objective:  BP (!) 141/86   Pulse 91   Temp 98 F (36.7 C) (Oral)  Resp 16   Ht 5' 9.5" (1.765 m)   Wt 224 lb (101.6 kg)   SpO2 93%   BMI 32.60 kg/m    BP Readings from Last 3 Encounters:  07/13/18 (!) 141/86  03/22/18 (!) 146/80  11/17/17 134/78     Wt Readings from Last 3 Encounters:  07/13/18 224 lb (101.6 kg)  03/22/18 228 lb 12.8 oz (103.8 kg)  11/17/17 227 lb 12.8 oz (103.3 kg)     Physical Exam Pt is alert and oriented PERRL EOMI HEART IS RRR no murmur or rub LCTA no wheezing or rales MUSCULOSKELETAL reveals slight weakness rated at a 4+/5 to flexion at the right hip.  He has a positive straight leg raise on the  right side negative on the left in the supine position.  His muscle tone and bulk is symmetric and appropriate.  He has slightly diminished sensation over the dorsum of the right foot.  He ambulates with a mildly antalgic gait.  Labs  No results found for: HGBA1C Lab Results  Component Value Date   MICROALBUR <0.7 08/17/2017   LDLCALC 122 (H) 12/23/2014   CREATININE 1.25 03/22/2018    -------------------------------------------------------------------------------------------------------------------- Lab Results  Component Value Date   WBC 7.7 08/17/2017   HGB 15.2 08/17/2017   HCT 44.9 08/17/2017   PLT 211.0 08/17/2017   GLUCOSE 85 03/22/2018   CHOL 193 03/22/2018   TRIG 279.0 (H) 03/22/2018   HDL 33.90 (L) 03/22/2018   LDLDIRECT 109.0 03/22/2018   LDLCALC 122 (H) 12/23/2014   ALT 25 03/22/2018   AST 23 03/22/2018   NA 138 03/22/2018   K 3.8 03/22/2018   CL 102 03/22/2018   CREATININE 1.25 03/22/2018   BUN 13 03/22/2018   CO2 29 03/22/2018   TSH 3.11 11/18/2016   PSA 0.78 08/17/2017   MICROALBUR <0.7 08/17/2017    --------------------------------------------------------------------------------------------------------------------- Dg C-arm 1-60 Min-no Report  Result Date: 07/13/2018 Fluoroscopy was utilized by the requesting physician.  No radiographic interpretation.     Assessment & Plan:   Dustin [name] was seen today for groin pain, leg pain and pain.  Diagnoses and all orders for this visit:  DDD (degenerative disc disease), lumbar -     MR LUMBAR SPINE WO CONTRAST; Future  Sciatica of right side -     MR LUMBAR SPINE WO CONTRAST; Future  Chronic right-sided low back pain with right-sided sciatica  Other orders -     triamcinolone acetonide (KENALOG-40) injection 40 mg -     sodium chloride flush (NS) 0.9 % injection 10 mL -     ropivacaine (PF) 2 mg/mL (0.2%) (NAROPIN) injection 10 mL -     midazolam (VERSED) injection 5 mg -     lidocaine (PF)  (XYLOCAINE) 1 % injection 5 mL -     lactated ringers infusion 1,000 mL -     iopamidol (ISOVUE-M) 41 % intrathecal injection 20 mL -     lidocaine (PF) (XYLOCAINE) 1 % injection 5 mL        ----------------------------------------------------------------------------------------------------------------------  Problem List Items Addressed This Visit    None    Visit Diagnoses    DDD (degenerative disc disease), lumbar    -  Primary   Relevant Medications   triamcinolone acetonide (KENALOG-40) injection 40 mg (Completed)   Other Relevant Orders   MR LUMBAR SPINE WO CONTRAST   Sciatica of right side       Relevant Medications   midazolam (VERSED) injection 5 mg   Other Relevant  Orders   MR LUMBAR SPINE WO CONTRAST   Chronic right-sided low back pain with right-sided sciatica       Relevant Medications   triamcinolone acetonide (KENALOG-40) injection 40 mg (Completed)   midazolam (VERSED) injection 5 mg        ----------------------------------------------------------------------------------------------------------------------  1. DDD (degenerative disc disease), lumbar Based on the severity of his pain and his inability to get relief he has requested an epidural steroid injection as described to him as an option today.  We have gone over the risks and benefits of the procedure with him in full detail and all questions are answered.  I am planning on having him return in approximately 2 to 3 weeks for reevaluation.  Depending on his level of relief we may proceed with a second epidural at that time.  Furthermore we are requesting an MRI to further elucidate the nature of any lumbar pathology.  I want him to continue with his physical therapy for follow-up. - MR LUMBAR SPINE WO CONTRAST; Future  2. Sciatica of right side As above - MR LUMBAR SPINE WO CONTRAST; Future  3. Chronic right-sided low back pain with right-sided sciatica As  above    ----------------------------------------------------------------------------------------------------------------------  I am having Dustin [name] maintain his budesonide, ALPRAZolam, traMADol, tamsulosin, sildenafil, ergocalciferol, hydrochlorothiazide, buPROPion, omeprazole, losartan, and tadalafil. We administered triamcinolone acetonide, sodium chloride flush, ropivacaine (PF) 2 mg/mL (0.2%), iopamidol, and lidocaine (PF).   Meds ordered this encounter  Medications  . triamcinolone acetonide (KENALOG-40) injection 40 mg  . sodium chloride flush (NS) 0.9 % injection 10 mL  . ropivacaine (PF) 2 mg/mL (0.2%) (NAROPIN) injection 10 mL  . midazolam (VERSED) injection 5 mg  . lidocaine (PF) (XYLOCAINE) 1 % injection 5 mL  . lactated ringers infusion 1,000 mL  . iopamidol (ISOVUE-M) 41 % intrathecal injection 20 mL  . lidocaine (PF) (XYLOCAINE) 1 % injection 5 mL   Patient's Medications  New Prescriptions   No medications on file  Previous Medications   ALPRAZOLAM (XANAX) 0.25 MG TABLET    TAKE 1 TABLET BY MOUTH TWICE A DAY AS NEEDED FOR ANXIETY   BUDESONIDE (RHINOCORT ALLERGY) 32 MCG/ACT NASAL SPRAY    Place 2 sprays into both nostrils daily. Reported on 03/31/2016   BUPROPION (WELLBUTRIN XL) 300 MG 24 HR TABLET    Take 1 tablet (300 mg total) by mouth daily.   ERGOCALCIFEROL (DRISDOL) 50000 UNITS CAPSULE    Take 1 capsule (50,000 Units total) by mouth once a week.   HYDROCHLOROTHIAZIDE (HYDRODIURIL) 25 MG TABLET    Take 1 tablet (25 mg total) by mouth daily.   LOSARTAN (COZAAR) 50 MG TABLET    TAKE 1 TABLET BY MOUTH EVERY DAY   OMEPRAZOLE (PRILOSEC) 40 MG CAPSULE    Take 1 capsule (40 mg total) by mouth daily.   SILDENAFIL (REVATIO) 20 MG TABLET    Take 1 tablet (20 mg total) by mouth 3 (three) times daily.   TADALAFIL (ADCIRCA/CIALIS) 20 MG TABLET    TAKE 0.5-1 TABLETS (10-20 MG TOTAL) EVERY OTHER DAY AS NEEDED BY MOUTH FOR ERECTILE DYSFUNCTION.   TAMSULOSIN (FLOMAX) 0.4  MG CAPS CAPSULE    Take 1 capsule (0.4 mg total) by mouth daily.   TRAMADOL (ULTRAM) 50 MG TABLET    Take 1 tablet (50 mg total) by mouth every 8 (eight) hours as needed.  Modified Medications   No medications on file  Discontinued Medications   No medications on file   ----------------------------------------------------------------------------------------------------------------------  Follow-up: Return for evaluation, procedure.   Procedure: L4-5 LESI with fluoroscopic guidance and no moderate sedation  NOTE: The risks, benefits, and expectations of the procedure have been discussed and explained to the patient who was understanding and in agreement with suggested treatment plan. No guarantees were made.  DESCRIPTION OF PROCEDURE: Lumbar epidural steroid injection with no IV Versed, EKG, blood pressure, pulse, and pulse oximetry monitoring. The procedure was performed with the patient in the prone position under fluoroscopic guidance.  Sterile prep x3 was initiated and I then injected subcutaneous lidocaine to the overlying 5 site after its fluoroscopic identifictation.  Using strict aseptic technique, I then advanced an 18-gauge Tuohy epidural needle in the midline using interlaminar approach via loss-of-resistance to saline technique. There was negative aspiration for heme or  CSF.  I then confirmed position with both AP and Lateral fluoroscan.  2 cc of Isovue were injected and a  total of 5 mL of Preservative-Free normal saline mixed with 40 mg of Kenalog and 1cc Ropicaine 0.2 percent were injected incrementally via the  epidurally placed needle. The needle was removed. The patient tolerated the injection well and was convalesced and discharged to home in stable condition. Should the patient have any post procedure difficulty they have been instructed on how to contact us for assistance.    Molli Barrows, MD

## 2018-07-31 NOTE — Addendum Note (Signed)
Addended by: Molli Barrows on: 07/31/2018 08:59 AM   Modules accepted: Orders

## 2018-08-01 ENCOUNTER — Ambulatory Visit
Admission: RE | Admit: 2018-08-01 | Discharge: 2018-08-01 | Disposition: A | Discharge status: Home / Self Care | Payer: Self-pay | Source: Ambulatory Visit | Attending: Anesthesiology | Admitting: Anesthesiology

## 2018-08-01 DIAGNOSIS — S73111A Iliofemoral ligament sprain of right hip, initial encounter: Secondary | ICD-10-CM | POA: Insufficient documentation

## 2018-08-01 DIAGNOSIS — X58XXXA Exposure to other specified factors, initial encounter: Secondary | ICD-10-CM | POA: Insufficient documentation

## 2018-08-01 DIAGNOSIS — M5136 Other intervertebral disc degeneration, lumbar region: Secondary | ICD-10-CM | POA: Insufficient documentation

## 2018-08-01 DIAGNOSIS — R600 Localized edema: Secondary | ICD-10-CM | POA: Insufficient documentation

## 2018-08-01 DIAGNOSIS — M25551 Pain in right hip: Principal | ICD-10-CM

## 2018-08-01 DIAGNOSIS — G8929 Other chronic pain: Secondary | ICD-10-CM

## 2018-08-01 DIAGNOSIS — M948X6 Other specified disorders of cartilage, lower leg: Secondary | ICD-10-CM | POA: Insufficient documentation

## 2018-08-01 DIAGNOSIS — M1611 Unilateral primary osteoarthritis, right hip: Secondary | ICD-10-CM | POA: Insufficient documentation

## 2018-08-01 DIAGNOSIS — M5431 Sciatica, right side: Secondary | ICD-10-CM | POA: Insufficient documentation

## 2018-08-01 DIAGNOSIS — M25851 Other specified joint disorders, right hip: Secondary | ICD-10-CM | POA: Insufficient documentation

## 2018-08-01 DIAGNOSIS — M48061 Spinal stenosis, lumbar region without neurogenic claudication: Secondary | ICD-10-CM | POA: Insufficient documentation

## 2018-08-02 ENCOUNTER — Ambulatory Visit: Payer: Self-pay | Attending: Anesthesiology | Admitting: Anesthesiology

## 2018-08-02 ENCOUNTER — Other Ambulatory Visit: Payer: Self-pay

## 2018-08-02 ENCOUNTER — Encounter: Payer: Self-pay | Admitting: Anesthesiology

## 2018-08-02 VITALS — BP 138/103 | HR 109 | Temp 97.8°F | Resp 18 | Ht 69.0 in | Wt 230.0 lb

## 2018-08-02 DIAGNOSIS — M5136 Other intervertebral disc degeneration, lumbar region: Secondary | ICD-10-CM | POA: Insufficient documentation

## 2018-08-02 DIAGNOSIS — M48061 Spinal stenosis, lumbar region without neurogenic claudication: Secondary | ICD-10-CM | POA: Insufficient documentation

## 2018-08-02 DIAGNOSIS — M25551 Pain in right hip: Secondary | ICD-10-CM | POA: Insufficient documentation

## 2018-08-02 DIAGNOSIS — M47896 Other spondylosis, lumbar region: Secondary | ICD-10-CM | POA: Insufficient documentation

## 2018-08-02 DIAGNOSIS — G8929 Other chronic pain: Secondary | ICD-10-CM | POA: Insufficient documentation

## 2018-08-02 DIAGNOSIS — Z79899 Other long term (current) drug therapy: Secondary | ICD-10-CM | POA: Insufficient documentation

## 2018-08-02 NOTE — Progress Notes (Signed)
Safety precautions to be maintained throughout the outpatient stay will include: orient to surroundings, keep bed in low position, maintain call [place] within reach at all times, provide assistance with transfer out of bed and ambulation.  

## 2018-08-02 NOTE — Progress Notes (Signed)
Subjective:  Patient ID: Dustin [name], male    DOB: 23-Oct-1955  Age: 62 y.o. MRN: [id number]  CC: Hip Pain (right)   Procedure: None  HPI Dustin [name] presents for reevaluation.  He had an epidural at his last visit and mentions that the radiating pain into the anterior lower leg and thigh as well as some of the low back pain have resolved.  He still gets some intrinsic hip pain and posterior gluteal pain that is worse with prolonged sitting such as driving.  He did have a recent MRI of the hip and this was reviewed today.  I have also reviewed his MRI of the low back with him as well.  He is not requiring any other medication management at this point.  He is working on some physical therapy as well and this is helping.  Outpatient Medications Prior to Visit  Medication Sig Dispense Refill  . ALPRAZolam (XANAX) 0.25 MG tablet TAKE 1 TABLET BY MOUTH TWICE A DAY AS NEEDED FOR ANXIETY 30 tablet 2  . buPROPion (WELLBUTRIN XL) 300 MG 24 hr tablet Take 1 tablet (300 mg total) by mouth daily. 90 tablet 2  . ergocalciferol (DRISDOL) 50000 units capsule Take 1 capsule (50,000 Units total) by mouth once a week. 4 capsule 11  . hydrochlorothiazide (HYDRODIURIL) 25 MG tablet Take 1 tablet (25 mg total) by mouth daily. 90 tablet 1  . losartan (COZAAR) 50 MG tablet TAKE 1 TABLET BY MOUTH EVERY DAY 90 tablet 1  . omeprazole (PRILOSEC) 40 MG capsule Take 1 capsule (40 mg total) by mouth daily. 90 capsule 2  . tadalafil (ADCIRCA/CIALIS) 20 MG tablet TAKE 0.5-1 TABLETS (10-20 MG TOTAL) EVERY OTHER DAY AS NEEDED BY MOUTH FOR ERECTILE DYSFUNCTION. 15 tablet 3  . budesonide (RHINOCORT ALLERGY) 32 MCG/ACT nasal spray Place 2 sprays into both nostrils daily. Reported on 03/31/2016    . sildenafil (REVATIO) 20 MG tablet Take 1 tablet (20 mg total) by mouth 3 (three) times daily. (Patient not taking: Reported on 08/02/2018) 30 tablet 0  . tamsulosin (FLOMAX) 0.4 MG CAPS capsule Take 1 capsule (0.4 mg total)  by mouth daily. (Patient not taking: Reported on 07/13/2018) 30 capsule 3  . traMADol (ULTRAM) 50 MG tablet Take 1 tablet (50 mg total) by mouth every 8 (eight) hours as needed. (Patient not taking: Reported on 07/13/2018) 30 tablet 0   No facility-administered medications prior to visit.     Review of Systems CNS: No confusion or sedation Cardiac: No angina or palpitations GI: No abdominal pain or constipation Constitutional: No nausea vomiting fevers or chills  Objective:  BP (!) 138/103 (BP Location: Left Arm, Patient Position: Sitting, Cuff Size: Normal)   Pulse (!) 109   Temp 97.8 F (36.6 C) (Oral)   Resp 18   Ht 5\' 9"  (1.753 m)   Wt 230 lb (104.3 kg)   SpO2 95%   BMI 33.97 kg/m    BP Readings from Last 3 Encounters:  08/02/18 (!) 138/103  07/13/18 (!) 141/86  03/22/18 (!) 146/80     Wt Readings from Last 3 Encounters:  08/02/18 230 lb (104.3 kg)  07/13/18 224 lb (101.6 kg)  03/22/18 228 lb 12.8 oz (103.8 kg)     Physical Exam Pt is alert and oriented PERRL EOMI HEART IS RRR no murmur or rub MUSCULOSKELETAL reveals some paraspinous muscle tenderness.  He walks with a minimally antalgic gait.  Labs  No results found for: HGBA1C Lab  Results  Component Value Date   MICROALBUR <0.7 08/17/2017   LDLCALC 122 (H) 12/23/2014   CREATININE 1.25 03/22/2018    -------------------------------------------------------------------------------------------------------------------- Lab Results  Component Value Date   WBC 7.7 08/17/2017   HGB 15.2 08/17/2017   HCT 44.9 08/17/2017   PLT 211.0 08/17/2017   GLUCOSE 85 03/22/2018   CHOL 193 03/22/2018   TRIG 279.0 (H) 03/22/2018   HDL 33.90 (L) 03/22/2018   LDLDIRECT 109.0 03/22/2018   LDLCALC 122 (H) 12/23/2014   ALT 25 03/22/2018   AST 23 03/22/2018   NA 138 03/22/2018   K 3.8 03/22/2018   CL 102 03/22/2018   CREATININE 1.25 03/22/2018   BUN 13 03/22/2018   CO2 29 03/22/2018   TSH 3.11 11/18/2016   PSA 0.78  08/17/2017   MICROALBUR <0.7 08/17/2017    --------------------------------------------------------------------------------------------------------------------- Mr Lumbar Spine Wo Contrast  Result Date: 08/01/2018 CLINICAL DATA:  Low back pain radiating into the right hip for 3 years. No known injury. EXAM: MRI LUMBAR SPINE WITHOUT CONTRAST TECHNIQUE: Multiplanar, multisequence MR imaging of the lumbar spine was performed. No intravenous contrast was administered. COMPARISON:  None. FINDINGS: Segmentation:  Standard. Alignment:  Normal. Vertebrae:  No fracture or worrisome lesion. Conus medullaris and cauda equina: Conus extends to the L1-2 level. Conus and cauda equina appear normal. Paraspinal and other soft tissues: Right renal scarring is noted. Disc levels: T11-12 is imaged in the sagittal plane only and negative. T12-L1: Negative. L1-2: Negative. L2-3: Shallow disc bulge and mild facet degenerative change. No stenosis. L3-4: Shallow disc bulge and mild-to-moderate facet degenerative disease. The central canal and foramina are open. L4-5: Moderate facet degenerative disease and a shallow disc bulge. The central canal is open. Mild bilateral foraminal narrowing is seen. L5-S1: There is a shallow disc bulge and mild-to-moderate facet degenerative change without stenosis. IMPRESSION: Mild multilevel degenerative disc disease without central canal narrowing. Mild bilateral foraminal narrowing is seen at L4-5. The foramina are otherwise open. Electronically Signed   By: Inge Rise M.D.   On: 08/01/2018 14:50   Mr Hip Right Wo Contrast  Result Date: 08/01/2018 CLINICAL DATA:  Chronic right hip pain. EXAM: MR OF THE RIGHT HIP WITHOUT CONTRAST TECHNIQUE: Multiplanar, multisequence MR imaging was performed. No intravenous contrast was administered. COMPARISON:  CT scan of the abdomen and pelvis dated 12/09/2006 FINDINGS: Bones: There is focal prominent edema at the anterior superolateral aspect of the  right acetabulum with associated small cystic degenerative changes in the acetabulum. There are small marginal osteophytes on the right femoral head. There are also small subcortical cysts in the superolateral aspect of the left acetabulum. There are slight sclerotic changes of anterior inferior aspect of both sacroiliac joints. Articular cartilage and labrum Articular cartilage: There is marked thinning of the articular cartilage of the superior aspect of the right hip joint. Labrum: There is severe degeneration of the anterior and superolateral aspects of the left acetabulum without a definable tear. Joint or bursal effusion Joint effusion:  No significant joint effusion. Bursae: No bursitis. Muscles and tendons Muscles and tendons: There is focal edema and what appear to be small calcifications along the deep aspect of the right iliopsoas muscle anterior to the femoral head, best seen on series 5 and images 24 through 26 of series 4. This is lateral to the iliopsoas tendon. Other findings Miscellaneous: There is edema superficial and deep to the right iliofemoral ligament. There appears to be detachment of the ilial attachment of the ligament best seen on images  20 through 24 of series 4. IMPRESSION: 1. Moderate arthritic changes of the superior aspect of the right acetabulum with cartilage loss and subcortical edema and cyst formation in the acetabulum. 2. Inflammation of the deep fibers of the iliopsoas muscle with possible calcifications just anterior to the femoral head. 3. Severe degeneration of the superolateral and anterior aspects of the labrum of the right hip. 4. Partial detachment of the anterior fibers of the right iliofemoral ligament with adjacent soft tissue edema. Electronically Signed   By: Lorriane Shire M.D.   On: 08/01/2018 15:50     Assessment & Plan:   Dustin [name] was seen today for hip pain.  Diagnoses and all orders for this visit:  Pain of right hip joint -     Ambulatory referral  to Orthopedic Surgery        ----------------------------------------------------------------------------------------------------------------------  Problem List Items Addressed This Visit    None    Visit Diagnoses    Pain of right hip joint    -  Primary   Relevant Orders   Ambulatory referral to Orthopedic Surgery        ----------------------------------------------------------------------------------------------------------------------  1. Pain of right hip joint I am going to refer him to Dr. Marry Guan for an evaluation.  The MRIs was reviewed with him of his right hip today.  He has had 2 previous hip injections and these were ineffective and I think he does need an orthopedic referral for possible arthroplasty consideration. - Ambulatory referral to Orthopedic Surgery  2.  Degenerative disc disease  3.  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  This appears to be much better at this stage and we will defer on any repeat injections.  I talked to him about returning for visit in 2 months should he have recurrence of the same type sciatica L4-L5 distribution as previously documented.  Want him to continue with his stretching strengthening exercises and physical therapy    ----------------------------------------------------------------------------------------------------------------------  I am having Dustin [name] maintain his budesonide, ALPRAZolam, traMADol, tamsulosin, sildenafil, ergocalciferol, hydrochlorothiazide, buPROPion, omeprazole, losartan, and tadalafil.   No orders of the defined types were placed in this encounter.  Patient's Medications  New Prescriptions   No medications on file  Previous Medications   ALPRAZOLAM (XANAX) 0.25 MG TABLET    TAKE 1 TABLET BY MOUTH TWICE A DAY AS NEEDED FOR ANXIETY   BUDESONIDE (RHINOCORT ALLERGY) 32 MCG/ACT NASAL SPRAY    Place 2 sprays into both nostrils daily. Reported on 03/31/2016   BUPROPION  (WELLBUTRIN XL) 300 MG 24 HR TABLET    Take 1 tablet (300 mg total) by mouth daily.   ERGOCALCIFEROL (DRISDOL) 50000 UNITS CAPSULE    Take 1 capsule (50,000 Units total) by mouth once a week.   HYDROCHLOROTHIAZIDE (HYDRODIURIL) 25 MG TABLET    Take 1 tablet (25 mg total) by mouth daily.   LOSARTAN (COZAAR) 50 MG TABLET    TAKE 1 TABLET BY MOUTH EVERY DAY   OMEPRAZOLE (PRILOSEC) 40 MG CAPSULE    Take 1 capsule (40 mg total) by mouth daily.   SILDENAFIL (REVATIO) 20 MG TABLET    Take 1 tablet (20 mg total) by mouth 3 (three) times daily.   TADALAFIL (ADCIRCA/CIALIS) 20 MG TABLET    TAKE 0.5-1 TABLETS (10-20 MG TOTAL) EVERY OTHER DAY AS NEEDED BY MOUTH FOR ERECTILE DYSFUNCTION.   TAMSULOSIN (FLOMAX) 0.4 MG CAPS CAPSULE    Take 1 capsule (0.4 mg total) by mouth daily.   TRAMADOL (  ULTRAM) 50 MG TABLET    Take 1 tablet (50 mg total) by mouth every 8 (eight) hours as needed.  Modified Medications   No medications on file  Discontinued Medications   No medications on file   ----------------------------------------------------------------------------------------------------------------------  Follow-up: Return if symptoms worsen or fail to improve, for evaluation.    Molli Barrows, MD

## 2018-08-07 DIAGNOSIS — M1611 Unilateral primary osteoarthritis, right hip: Secondary | ICD-10-CM | POA: Insufficient documentation

## 2018-08-14 ENCOUNTER — Ambulatory Visit: Payer: Self-pay | Admitting: Anesthesiology

## 2018-08-15 DIAGNOSIS — F419 Anxiety disorder, unspecified: Secondary | ICD-10-CM | POA: Insufficient documentation

## 2018-08-15 DIAGNOSIS — F32A Depression, unspecified: Secondary | ICD-10-CM | POA: Insufficient documentation

## 2018-08-15 DIAGNOSIS — C6290 Malignant neoplasm of unspecified testis, unspecified whether descended or undescended: Secondary | ICD-10-CM | POA: Insufficient documentation

## 2018-08-16 MED ORDER — LOSARTAN POTASSIUM 50 MG PO TABS
50.00 | ORAL_TABLET | ORAL | Status: DC
Start: 2018-08-17 — End: 2018-08-16

## 2018-08-16 MED ORDER — HYDROCHLOROTHIAZIDE 25 MG PO TABS
25.00 | ORAL_TABLET | ORAL | Status: DC
Start: 2018-08-17 — End: 2018-08-16

## 2018-08-16 MED ORDER — FLUTICASONE PROPIONATE 50 MCG/ACT NA SUSP
2.00 | NASAL | Status: DC
Start: ? — End: 2018-08-16

## 2018-08-16 MED ORDER — PANTOPRAZOLE SODIUM 40 MG PO TBEC
40.00 | DELAYED_RELEASE_TABLET | ORAL | Status: DC
Start: 2018-08-17 — End: 2018-08-16

## 2018-08-16 MED ORDER — BUPROPION HCL ER (XL) 150 MG PO TB24
300.00 | ORAL_TABLET | ORAL | Status: DC
Start: 2018-08-17 — End: 2018-08-16

## 2018-08-16 MED ORDER — GABAPENTIN 100 MG PO CAPS
100.00 | ORAL_CAPSULE | ORAL | Status: DC
Start: 2018-08-16 — End: 2018-08-16

## 2018-08-16 MED ORDER — MELOXICAM 7.5 MG PO TABS
7.50 | ORAL_TABLET | ORAL | Status: DC
Start: 2018-08-17 — End: 2018-08-16

## 2018-08-16 MED ORDER — ASPIRIN 81 MG PO CHEW
81.00 | CHEWABLE_TABLET | ORAL | Status: DC
Start: 2018-08-16 — End: 2018-08-16

## 2018-08-16 MED ORDER — ACETAMINOPHEN 325 MG PO TABS
975.00 | ORAL_TABLET | ORAL | Status: DC
Start: 2018-08-16 — End: 2018-08-16

## 2018-08-16 MED ORDER — SALINE NASAL SPRAY 0.65 % NA SOLN
2.00 | NASAL | Status: DC
Start: ? — End: 2018-08-16

## 2018-08-16 MED ORDER — OXYCODONE HCL 5 MG PO TABS
5.00 | ORAL_TABLET | ORAL | Status: DC
Start: ? — End: 2018-08-16

## 2018-08-16 MED ORDER — BISACODYL 10 MG RE SUPP
10.00 | RECTAL | Status: DC
Start: ? — End: 2018-08-16

## 2018-08-16 MED ORDER — GENERIC EXTERNAL MEDICATION
4.00 | Status: DC
Start: ? — End: 2018-08-16

## 2018-08-16 MED ORDER — LIDOCAINE HCL 1 % IJ SOLN
0.50 | INTRAMUSCULAR | Status: DC
Start: ? — End: 2018-08-16

## 2018-08-16 MED ORDER — SENNOSIDES-DOCUSATE SODIUM 8.6-50 MG PO TABS
2.00 | ORAL_TABLET | ORAL | Status: DC
Start: 2018-08-16 — End: 2018-08-16

## 2018-08-16 MED ORDER — MULTI-VITAMINS PO TABS
1.00 | ORAL_TABLET | ORAL | Status: DC
Start: 2018-08-17 — End: 2018-08-16

## 2018-08-16 MED ORDER — POLYETHYLENE GLYCOL 3350 17 G PO PACK
17.00 | PACK | ORAL | Status: DC
Start: 2018-08-17 — End: 2018-08-16

## 2018-09-02 DIAGNOSIS — Z96641 Presence of right artificial hip joint: Secondary | ICD-10-CM | POA: Insufficient documentation

## 2018-09-18 ENCOUNTER — Other Ambulatory Visit: Payer: Self-pay | Admitting: Internal Medicine

## 2018-10-14 ENCOUNTER — Other Ambulatory Visit: Payer: Self-pay | Admitting: Internal Medicine

## 2018-11-11 ENCOUNTER — Other Ambulatory Visit: Payer: Self-pay | Admitting: Internal Medicine

## 2018-11-23 ENCOUNTER — Encounter: Payer: Self-pay | Admitting: Family Medicine

## 2018-11-23 ENCOUNTER — Ambulatory Visit (INDEPENDENT_AMBULATORY_CARE_PROVIDER_SITE_OTHER): Payer: Self-pay | Admitting: Family Medicine

## 2018-11-23 ENCOUNTER — Ambulatory Visit: Payer: Self-pay | Admitting: *Deleted

## 2018-11-23 VITALS — BP 118/78 | HR 100 | Temp 98.2°F | Resp 18 | Ht 69.5 in | Wt 236.0 lb

## 2018-11-23 DIAGNOSIS — I10 Essential (primary) hypertension: Secondary | ICD-10-CM

## 2018-11-23 NOTE — Progress Notes (Signed)
Subjective:    Patient ID: Dustin [name], male    DOB: 1955-10-19, 63 y.o.   MRN: [id number]  HPI   Patient presents to clinic due to elevated BP readings at home.  Patient states the readings he got this morning were 173/108, then 156/98 about 5 minutes later.  Patient states his blood pressure readings were also in this yesterday morning according to his cough.  Patient states yesterday evening he took his blood pressure and it was 141/90.  Currently he is taking losartan 50 mg once per day for blood pressure control.  He usually takes this in the morning.  The readings of 173/108 and 156/98 were taken prior to his blood pressure medication dose.  Denies any chest pain.  Denies any lower extremity swelling.  Denies palpitations.  Denies wheezing or cough.  States he has been feeling fatigued, but that is not new for him.  States he already takes something for B12 deficiency, D deficiency.  Patient Active Problem List   Diagnosis Date Noted  . BPH (benign prostatic hyperplasia) 08/20/2017  . B12 deficiency 08/20/2017  . Vitamin D deficiency 08/20/2017  . Venous insufficiency of both lower extremities 11/19/2016  . Venous insufficiency of left lower extremity 11/19/2016  . Edema 11/18/2016  . History of tobacco abuse 11/18/2016  . Polyarthritis 11/18/2016  . External hemorrhoid 09/13/2016  . FHx: AAA 06/28/2016  . Actinic keratosis 12/29/2015  . Left shoulder pain 12/29/2015  . Encounter for preventive health examination 10/26/2011  . Hyperlipidemia LDL goal <100 09/22/2009  . GERD 07/12/2008  . 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 Essential hypertension, benign 06/08/2007  . CEREBRAL ANEURYSM 06/08/2007  . 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 Smoking status: Former Smoker    Packs/day: 1.50    Years: 40.00    Pack years: 60.00    Types: Cigarettes    Last attempt to quit: 04/25/2012    Years since quitting: 6.5  . Smokeless  tobacco: Never Used  Substance Use Topics  . Alcohol use: Yes    Alcohol/week: 7.0 standard drinks    Types: 7 Standard drinks or equivalent per week    Comment: occassionally   Review of Systems   Constitutional: Negative for chills, and fever. +fatigue HENT: Negative for congestion, ear pain, sinus pain and sore throat.   Eyes: Negative.   Respiratory: Negative for cough, shortness of breath and wheezing.   Cardiovascular: Negative for chest pain, palpitations and leg swelling. +elevated BP  Gastrointestinal: Negative for abdominal pain, diarrhea, nausea and vomiting.  Genitourinary: Negative for dysuria, frequency and urgency.  Musculoskeletal: Negative for arthralgias and myalgias.  Skin: Negative for color change, pallor and rash.  Neurological: Negative for syncope, light-headedness and headaches.  Psychiatric/Behavioral: The patient is not nervous/anxious.       Objective:   Physical Exam Vitals signs and nursing note reviewed.  Constitutional:      General: He is not in acute distress.    Appearance: He is not ill-appearing, toxic-appearing or diaphoretic.  HENT:     Head: Normocephalic and atraumatic.  Eyes:     General: No scleral icterus.    Extraocular Movements: Extraocular movements intact.     Pupils: Pupils are equal, round, and reactive to light.  Neck:     Musculoskeletal: Neck supple. No neck rigidity.     Vascular: No carotid bruit.     Trachea: Trachea normal.  Cardiovascular:     Rate  and Rhythm: Normal rate and regular rhythm.     Heart sounds: No murmur.  Pulmonary:     Effort: Pulmonary effort is normal. No respiratory distress.     Breath sounds: Normal breath sounds. No wheezing, rhonchi or rales.  Musculoskeletal:     Right lower leg: No edema.     Left lower leg: No edema.  Lymphadenopathy:     Cervical: No cervical adenopathy.  Skin:    General: Skin is warm and dry.     Coloration: Skin is not jaundiced or pale.  Neurological:      Mental Status: He is alert and oriented to person, place, and time.    Today's Vitals   11/23/18 1452 11/23/18 1458 11/23/18 1503  BP: 110/78 112/76 118/78  Pulse: 100    Resp: 18    Temp: 98.2 F (36.8 C)    TempSrc: Oral    SpO2: 91%    Weight: 236 lb (107 kg)    Height: 5' 9.5" (1.765 m)     Body mass index is 34.35 kg/m.  BP Readings from Last 3 Encounters:  11/23/18 118/78  08/02/18 (!) 138/103  07/13/18 (!) 141/86       Assessment & Plan:    A total of 25  minutes were spent face-to-face with the patient during this encounter and over half of that time was spent on counseling and coordination of care. The patient was counseled on BP, our plan for BP monitoring.    Essential hypertension-blood pressure check in each arm by medical assistant and readings are very good.  Blood pressure checked by me at the end of exam, and my reading also shows very good blood pressure.  At this point I do not want to add or change his pressure medications with concerns of his pressures going too low.  Patient will keep a log of his blood pressure readings over the next 2 to 3 weeks with at least one reading in the morning and one reading in the evening.  At next appointment he will bring his blood pressure cuff and BP log with him so we can compare our cuff to his cuff as well as look at his blood pressure readings and find any trends.  Advised patient if his readings continue to be very elevated in the 160s over 90s at home to call office back and let us know prior to his next appointment.  Patient also states he does need to schedule himself a annual physical as well.  Offered to check some blood work today including CBC, electrolyte panel, thyroid level, vitamin D and B12, but patient declines.  States he needs annual fasting blood work for his physical but he is not fasting currently so he would prefer not to do the blood work today.  Patient will return to clinic in 2 to 3 weeks for  follow-up on blood pressure.

## 2018-11-23 NOTE — Telephone Encounter (Signed)
Elevated BP for a few days- patient has been working out of town but for the past couple days his BP has been elevated. Patient is concerned about this. Lowest BP yesterday- 143/88- patient thinks his BP should be under better control.  Reason for Disposition . Systolic BP  >= 948 OR Diastolic >= 546  Answer Assessment - Initial Assessment Questions 1. BLOOD PRESSURE: "What is the blood pressure?" "Did you take at least two measurements 5 minutes apart?"     173/108,  156/98 2. ONSET: "When did you take your blood pressure?"     1 hour ago, 10:00 3. HOW: "How did you obtain the blood pressure?" (e.g., visiting nurse, automatic home BP monitor)     Automatic cuff 4. HISTORY: "Do you have a history of high blood pressure?"     yes 5. MEDICATIONS: "Are you taking any medications for blood pressure?" "Have you missed any doses recently?"     Yes- no missed doses 6. OTHER SYMPTOMS: "Do you have any symptoms?" (e.g., headache, chest pain, blurred vision, difficulty breathing, weakness)     Periodic dizziness 7. PREGNANCY: "Is there any chance you are pregnant?" "When was your last menstrual period?"     n/a  Protocols used: HIGH BLOOD PRESSURE-A-AH

## 2018-11-23 NOTE — Telephone Encounter (Signed)
FYI Elevated BP for a few days- patient has been working out of town but for the past couple days his BP has been elevated. Patient is concerned about this. Lowest BP yesterday- 143/88- patient thinks his BP should be under better control. Pt appt today at 3:00pm

## 2018-11-23 NOTE — Patient Instructions (Signed)
Check blood pressure with your cuff at home in the morning and in the evening every day for the next 2 to 3 weeks and keep a log of readings.  When you come back to clinic next time bring your blood pressure cuff and reading log and we will compare to our reading.

## 2018-12-29 ENCOUNTER — Encounter: Payer: Self-pay | Admitting: Internal Medicine

## 2019-01-09 ENCOUNTER — Other Ambulatory Visit: Payer: Self-pay | Admitting: Internal Medicine

## 2019-01-11 ENCOUNTER — Other Ambulatory Visit: Payer: Self-pay | Admitting: Internal Medicine

## 2019-01-11 NOTE — Telephone Encounter (Signed)
Last OV 11/23/2018  Last refilled 11/13/2018 disp 30 with no refills   Next appt none scheduled   Sent to PCP for approval

## 2019-02-05 DIAGNOSIS — M5136 Other intervertebral disc degeneration, lumbar region: Secondary | ICD-10-CM | POA: Insufficient documentation

## 2019-02-06 DIAGNOSIS — M67951 Unspecified disorder of synovium and tendon, right thigh: Secondary | ICD-10-CM | POA: Insufficient documentation

## 2019-03-11 ENCOUNTER — Other Ambulatory Visit: Payer: Self-pay | Admitting: Internal Medicine

## 2019-04-09 ENCOUNTER — Other Ambulatory Visit: Payer: Self-pay | Admitting: Internal Medicine

## 2019-05-02 ENCOUNTER — Encounter: Payer: Self-pay | Admitting: Internal Medicine

## 2019-05-08 ENCOUNTER — Other Ambulatory Visit: Payer: Self-pay

## 2019-05-10 ENCOUNTER — Encounter: Payer: Self-pay | Admitting: Internal Medicine

## 2019-05-10 ENCOUNTER — Ambulatory Visit (INDEPENDENT_AMBULATORY_CARE_PROVIDER_SITE_OTHER): Payer: Self-pay | Admitting: Internal Medicine

## 2019-05-10 ENCOUNTER — Other Ambulatory Visit: Payer: Self-pay

## 2019-05-10 VITALS — BP 134/78 | HR 80 | Temp 98.1°F | Resp 15 | Ht 69.5 in | Wt 226.4 lb

## 2019-05-10 DIAGNOSIS — I872 Venous insufficiency (chronic) (peripheral): Secondary | ICD-10-CM

## 2019-05-10 DIAGNOSIS — R1319 Other dysphagia: Secondary | ICD-10-CM

## 2019-05-10 DIAGNOSIS — E538 Deficiency of other specified B group vitamins: Secondary | ICD-10-CM

## 2019-05-10 DIAGNOSIS — I10 Essential (primary) hypertension: Secondary | ICD-10-CM

## 2019-05-10 DIAGNOSIS — R1310 Dysphagia, unspecified: Secondary | ICD-10-CM

## 2019-05-10 DIAGNOSIS — Z125 Encounter for screening for malignant neoplasm of prostate: Secondary | ICD-10-CM

## 2019-05-10 DIAGNOSIS — Z0001 Encounter for general adult medical examination with abnormal findings: Secondary | ICD-10-CM

## 2019-05-10 DIAGNOSIS — E785 Hyperlipidemia, unspecified: Secondary | ICD-10-CM

## 2019-05-10 DIAGNOSIS — E559 Vitamin D deficiency, unspecified: Secondary | ICD-10-CM

## 2019-05-10 DIAGNOSIS — Z8719 Personal history of other diseases of the digestive system: Secondary | ICD-10-CM

## 2019-05-10 MED ORDER — SILDENAFIL CITRATE 20 MG PO TABS: 20.0000 mg | ORAL_TABLET | Freq: Three times a day (TID) | ORAL | 3 refills | Status: DC

## 2019-05-10 MED ORDER — OMEPRAZOLE 40 MG PO CPDR: 40.0000 mg | DELAYED_RELEASE_CAPSULE | Freq: Two times a day (BID) | ORAL | 2 refills | Status: DC

## 2019-05-10 NOTE — Progress Notes (Signed)
Patient ID: Dustin [name], male    DOB: 12/03/1955  Age: 63 y.o. MRN: [id number]  The patient is here for annual preventive examination and management of other chronic and acute problems.   Last colonoscopy 2018 due again in 2023  The risk factors are reflected in the social history.  The roster of all physicians providing medical care to patient - is listed in the Snapshot section of the chart.  Activities of daily living:  The patient is 100% independent in all ADLs: dressing, toileting, feeding as well as independent mobility  Home safety : The patient has smoke detectors in the home. They wear seatbelts.  There are no firearms at home. There is no violence in the home.   There is no risks for hepatitis, STDs or HIV. There is no   history of blood transfusion. They have no travel history to infectious disease endemic areas of the world.  The patient has seen their dentist in the last six month. They have seen their eye doctor in the last year. They admit to slight hearing difficulty with regard to whispered voices and some television programs.  They have deferred audiologic testing in the last year.  They do not  have excessive sun exposure. Discussed the need for sun protection: hats, long sleeves and use of sunscreen if there is significant sun exposure.  He does not exercise regularly   Depression screen: there are no signs or vegative symptoms of depression- irritability, change in appetite, anhedonia, sadness/tearfullness.  Cognitive assessment: the patient manages all their financial and personal affairs and is actively engaged. They could relate day,date,year and events; recalled 2/3 objects at 3 minutes; performed clock-face test normally.  The following portions of the patient's history were reviewed and updated as appropriate: allergies, current medications, past family history, past medical history,  past surgical history, past social history  and problem list.  Visual acuity  was not assessed per patient preference since she has regular follow up with her ophthalmologist. Hearing and body mass index were assessed and reviewed.   During the course of the visit the patient was educated and counseled about appropriate screening and preventive services including : fall prevention , diabetes screening, nutrition counseling, colorectal cancer screening, and recommended immunizations.   CC: There were no encounter diagnoses.   1) dysphagia for solids  getting worse : had an EGD by Vira Agar in 2018,  told he had erosive esophagitis omeprazole 40 mg prescribed but  Has not been compliant    2)  He is s/p Right Hip replacement in Nov 2019 at [facility]  .    3) hemorrhoids bothering him  4) back pain: had a lumbar ESI in November  2019  5) Hypertension: patient checks blood pressure twice weekly at home.  Readings have been for the most part < 140/80 at rest . 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   He has a past surgical history that includes Inguinal hernia repair (child); Tonsillectomy; right testicle removed (1978); Abdominal exploration surgery (1978); prolactinoma excision (09/04); Esophagogastroduodenoscopy (03/08 ); Appendectomy; stress nuclear (negative); Mohs surgery (Left, nose); and Shoulder open rotator cuff repair (Left, 6/17).   His family history includes AAA (abdominal aortic aneurysm) in his father; Cancer in his brother, paternal aunt, and another family member; Cerebral aneurysm  in his paternal grandfather.He reports that he quit smoking about 7 years ago. His smoking use included cigarettes. He has a 60.00 pack-year smoking history. He has never used smokeless tobacco. He reports current alcohol use of about 7.0 standard drinks of alcohol  per week. He reports that he does not use drugs.  Outpatient Medications Prior to Visit  Medication Sig Dispense Refill  . budesonide (RHINOCORT ALLERGY) 32 MCG/ACT nasal spray Place 2 sprays into both nostrils daily. Reported on 03/31/2016    . buPROPion (WELLBUTRIN XL) 300 MG 24 hr tablet Take 1 tablet by mouth daily 90 tablet 0  . ergocalciferol (DRISDOL) 50000 units capsule Take 1 capsule (50,000 Units total) by mouth once a week. 4 capsule 11  . hydrochlorothiazide (HYDRODIURIL) 25 MG tablet Take 1 tablet by mouth once daily 90 tablet 0  . losartan (COZAAR) 50 MG tablet Take 1 tablet by mouth once daily 90 tablet 0  . omeprazole (PRILOSEC) 40 MG capsule Take 1 capsule (40 mg total) by mouth daily. 90 capsule 2  . sildenafil (REVATIO) 20 MG tablet TAKE 1 TABLET BY MOUTH THREE TIMES DAILY 30 tablet 0  . ALPRAZolam (XANAX) 0.25 MG tablet TAKE 1 TABLET BY MOUTH TWICE A DAY AS NEEDED FOR ANXIETY (Patient not taking: Reported on 05/10/2019) 30 tablet 2   No facility-administered medications prior to visit.     Review of Systems  Objective:  BP 134/78 (BP Location: Left Arm, Patient Position: Sitting, Cuff Size: Large)   Pulse 80   Temp 98.1 F (36.7 C) (Oral)   Resp 15   Ht 5' 9.5" (1.765 m)   Wt 226 lb 6.4 oz (102.7 kg)   SpO2 96%   BMI 32.95 kg/m   Physical Exam    Assessment & Plan:   Problem List Items Addressed This Visit    None      I am having Issaac L. Primmer III maintain his budesonide, ALPRAZolam, ergocalciferol, omeprazole, buPROPion, sildenafil, hydrochlorothiazide, and losartan.  No orders of the defined types were placed in this encounter.   There are no discontinued medications.  Follow-up: No follow-ups on file.   Crecencio Mc, MD

## 2019-05-10 NOTE — Patient Instructions (Signed)
Take the losartan to  night instead of morning  Increase your omeprazole  to twice daily   GI referral both dysphagia  And  Hemorrhoid , you'll need a barium esophageal study first which I will order   Health Maintenance, Male Adopting a healthy lifestyle and getting preventive care are important in promoting health and wellness. Ask your health care provider about:  The right schedule for you to have regular tests and exams.  Things you can do on your own to prevent diseases and keep yourself healthy. What should I know about diet, weight, and exercise? Eat a healthy diet   Eat a diet that includes plenty of vegetables, fruits, low-fat dairy products, and lean protein.  Do not eat a lot of foods that are high in solid fats, added sugars, or sodium. Maintain a healthy weight Body mass index (BMI) is a measurement that can be used to identify possible weight problems. It estimates body fat based on height and weight. Your health care provider can help determine your BMI and help you achieve or maintain a healthy weight. Get regular exercise Get regular exercise. This is one of the most important things you can do for your health. Most adults should:  Exercise for at least 150 minutes each week. The exercise should increase your heart rate and make you sweat (moderate-intensity exercise).  Do strengthening exercises at least twice a week. This is in addition to the moderate-intensity exercise.  Spend less time sitting. Even light physical activity can be beneficial. Watch cholesterol and blood lipids Have your blood tested for lipids and cholesterol at 64 years of age, then have this test every 5 years. You may need to have your cholesterol levels checked more often if:  Your lipid or cholesterol levels are high.  You are older than 64 years of age.  You are at high risk for heart disease. What should I know about cancer screening? Many types of cancers can be detected early  and may often be prevented. Depending on your health history and family history, you may need to have cancer screening at various ages. This may include screening for:  Colorectal cancer.  Prostate cancer.  Skin cancer.  Lung cancer. What should I know about heart disease, diabetes, and high blood pressure? Blood pressure and heart disease  High blood pressure causes heart disease and increases the risk of stroke. This is more likely to develop in people who have high blood pressure readings, are of African descent, or are overweight.  Talk with your health care provider about your target blood pressure readings.  Have your blood pressure checked: ? Every 3-5 years if you are 21-12 years of age. ? Every year if you are 73 years old or older.  If you are between the ages of 37 and 74 and are a current or former smoker, ask your health care provider if you should have a one-time screening for abdominal aortic aneurysm (AAA). Diabetes Have regular diabetes screenings. This checks your fasting blood sugar level. Have the screening done:  Once every three years after age 69 if you are at a normal weight and have a low risk for diabetes.  More often and at a younger age if you are overweight or have a high risk for diabetes. What should I know about preventing infection? Hepatitis B If you have a higher risk for hepatitis B, you should be screened for this virus. Talk with your health care provider to find out if you  are at risk for hepatitis B infection. Hepatitis C Blood testing is recommended for:  Everyone born from [birth year] through 1966.  Anyone with known risk factors for hepatitis C. Sexually transmitted infections (STIs)  You should be screened each year for STIs, including gonorrhea and chlamydia, if: ? You are sexually active and are younger than 64 years of age. ? You are older than 64 years of age and your health care provider tells you that you are at risk for this type of  infection. ? Your sexual activity has changed since you were last screened, and you are at increased risk for chlamydia or gonorrhea. Ask your health care provider if you are at risk.  Ask your health care provider about whether you are at high risk for HIV. Your health care provider may recommend a prescription medicine to help prevent HIV infection. If you choose to take medicine to prevent HIV, you should first get tested for HIV. You should then be tested every 3 months for as long as you are taking the medicine. Follow these instructions at home: Lifestyle  Do not use any products that contain nicotine or tobacco, such as cigarettes, e-cigarettes, and chewing tobacco. If you need help quitting, ask your health care provider.  Do not use street drugs.  Do not share needles.  Ask your health care provider for help if you need support or information about quitting drugs. Alcohol use  Do not drink alcohol if your health care provider tells you not to drink.  If you drink alcohol: ? Limit how much you have to 0-2 drinks a day. ? Be aware of how much alcohol is in your drink. In the [place] one drink equals one 12 oz bottle of beer (355 mL), one 5 oz glass of wine (148 mL), or one 1 oz glass of hard liquor (44 mL). General instructions  Schedule regular health, dental, and eye exams.  Stay current with your vaccines.  Tell your health care provider if: ? You often feel depressed. ? You have ever been abused or do not feel safe at home. Summary  Adopting a healthy lifestyle and getting preventive care are important in promoting health and wellness.  Follow your health care provider's instructions about healthy diet, exercising, and getting tested or screened for diseases.  Follow your health care provider's instructions on monitoring your cholesterol and blood pressure. This information is not intended to replace advice given to you by your health care provider. Make sure you  discuss any questions you have with your health care provider. Document Released: 03/25/2008 Document Revised: 09/20/2018 Document Reviewed: 09/20/2018 Elsevier Patient Education  2020 [employer].

## 2019-05-11 LAB — COMPREHENSIVE METABOLIC PANEL
ALT: 20 U/L (ref 0–53)
AST: 20 U/L (ref 0–37)
Albumin: 4.4 g/dL (ref 3.5–5.2)
Alkaline Phosphatase: 70 U/L (ref 39–117)
BUN: 17 mg/dL (ref 6–23)
CO2: 29 mEq/L (ref 19–32)
Calcium: 9.5 mg/dL (ref 8.4–10.5)
Chloride: 100 mEq/L (ref 96–112)
Creatinine, Ser: 1.26 mg/dL (ref 0.40–1.50)
GFR: 57.57 mL/min — ABNORMAL LOW (ref >60.00)
Glucose, Bld: 84 mg/dL (ref 70–99)
Potassium: 3.6 mEq/L (ref 3.5–5.1)
Sodium: 138 mEq/L (ref 135–145)
Total Bilirubin: 0.5 mg/dL (ref 0.2–1.2)
Total Protein: 6.4 g/dL (ref 6.0–8.3)

## 2019-05-11 LAB — LIPID PANEL
Cholesterol: 207 mg/dL — ABNORMAL HIGH (ref 0–200)
HDL: 43.2 mg/dL (ref >39.00)
NonHDL: 163.58
Total CHOL/HDL Ratio: 5
Triglycerides: 211 mg/dL — ABNORMAL HIGH (ref 0.0–149.0)
VLDL: 42.2 mg/dL — ABNORMAL HIGH (ref 0.0–40.0)

## 2019-05-11 LAB — VITAMIN D 25 HYDROXY (VIT D DEFICIENCY, FRACTURES): VITD: 45.2 ng/mL (ref 30.00–100.00)

## 2019-05-11 LAB — PSA: PSA: 1.16 ng/mL (ref 0.10–4.00)

## 2019-05-11 LAB — VITAMIN B12: Vitamin B-12: 200 pg/mL — ABNORMAL LOW (ref 211–911)

## 2019-05-11 LAB — LDL CHOLESTEROL, DIRECT: Direct LDL: 129 mg/dL

## 2019-05-11 LAB — TSH: TSH: 1.81 u[IU]/mL (ref 0.35–4.50)

## 2019-05-12 NOTE — Assessment & Plan Note (Signed)
Well controlled on current regimen. Renal function stable, no changes today. 

## 2019-05-12 NOTE — Assessment & Plan Note (Signed)

## 2019-05-14 ENCOUNTER — Other Ambulatory Visit: Payer: Self-pay | Admitting: Internal Medicine

## 2019-05-14 DIAGNOSIS — E538 Deficiency of other specified B group vitamins: Secondary | ICD-10-CM

## 2019-05-14 MED ORDER — CYANOCOBALAMIN 1000 MCG/ML IJ SOLN: 1000.0000 ug | INTRAMUSCULAR | 11 refills | Status: DC

## 2019-05-15 NOTE — Telephone Encounter (Signed)
Vitamin D was normal..  Refill changed to one tablet every 2 weeks

## 2019-05-15 NOTE — Telephone Encounter (Signed)
Refilled: 03/24/2018 Last OV: 05/10/2019 Next OV: not scheduled

## 2019-05-16 ENCOUNTER — Other Ambulatory Visit: Payer: Self-pay

## 2019-05-16 ENCOUNTER — Ambulatory Visit (INDEPENDENT_AMBULATORY_CARE_PROVIDER_SITE_OTHER): Payer: Self-pay

## 2019-05-16 ENCOUNTER — Other Ambulatory Visit (INDEPENDENT_AMBULATORY_CARE_PROVIDER_SITE_OTHER): Payer: Self-pay

## 2019-05-16 ENCOUNTER — Ambulatory Visit: Payer: Self-pay

## 2019-05-16 DIAGNOSIS — E538 Deficiency of other specified B group vitamins: Secondary | ICD-10-CM

## 2019-05-16 MED ORDER — CYANOCOBALAMIN 1000 MCG/ML IJ SOLN
1000.0000 ug | Freq: Once | INTRAMUSCULAR | Status: AC
  Administered 2019-05-16: 1000 ug via INTRAMUSCULAR

## 2019-05-16 NOTE — Progress Notes (Addendum)
Pt presents today for vitamin b12 injection. Left deltoid. Pt voiced no concerns nor showed any signs of distress.    I have reviewed the above information and agree with above.   Deborra Medina, MD

## 2019-05-18 LAB — INTRINSIC FACTOR ANTIBODIES: Intrinsic Factor: NEGATIVE

## 2019-05-21 ENCOUNTER — Ambulatory Visit
Admission: RE | Admit: 2019-05-21 | Discharge: 2019-05-21 | Disposition: A | Discharge status: Home / Self Care | Payer: Managed Care, Other (non HMO) | Source: Ambulatory Visit | Attending: Internal Medicine | Admitting: Internal Medicine

## 2019-05-21 ENCOUNTER — Other Ambulatory Visit: Payer: Self-pay

## 2019-05-21 DIAGNOSIS — Z8719 Personal history of other diseases of the digestive system: Secondary | ICD-10-CM | POA: Diagnosis present

## 2019-05-21 DIAGNOSIS — R1310 Dysphagia, unspecified: Secondary | ICD-10-CM | POA: Diagnosis present

## 2019-05-21 DIAGNOSIS — R1319 Other dysphagia: Secondary | ICD-10-CM

## 2019-05-22 ENCOUNTER — Ambulatory Visit: Payer: Self-pay

## 2019-05-22 ENCOUNTER — Other Ambulatory Visit: Payer: Self-pay

## 2019-05-22 DIAGNOSIS — E538 Deficiency of other specified B group vitamins: Secondary | ICD-10-CM

## 2019-05-22 MED ORDER — CYANOCOBALAMIN 1000 MCG/ML IJ SOLN
1000.0000 ug | Freq: Once | INTRAMUSCULAR | Status: AC
  Administered 2019-05-22: 1000 ug via INTRAMUSCULAR

## 2019-05-22 NOTE — Progress Notes (Addendum)
Patient presented today for nurse visit for B12 injection teaching.  Patient conducted self injection of B12 with medication supplied by patient.   Patient administered injection correctly IM in left thigh.  Patient tolerated well with no signs of distress.  Reviewed.  Dr Nicki Reaper

## 2019-06-01 MED ORDER — CYANOCOBALAMIN 1000 MCG/ML IJ SOLN
1000.0000 ug | Freq: Once | INTRAMUSCULAR | Status: AC
  Administered 2019-05-22: 1000 ug via INTRAMUSCULAR

## 2019-07-01 ENCOUNTER — Other Ambulatory Visit: Payer: Self-pay | Admitting: Internal Medicine

## 2019-07-02 ENCOUNTER — Other Ambulatory Visit: Payer: Self-pay

## 2019-07-02 ENCOUNTER — Ambulatory Visit (INDEPENDENT_AMBULATORY_CARE_PROVIDER_SITE_OTHER): Payer: Managed Care, Other (non HMO) | Admitting: Gastroenterology

## 2019-07-02 ENCOUNTER — Encounter: Payer: Self-pay | Admitting: Gastroenterology

## 2019-07-02 VITALS — BP 132/76 | HR 85 | Temp 98.3°F | Ht 69.5 in | Wt 231.2 lb

## 2019-07-02 DIAGNOSIS — K219 Gastro-esophageal reflux disease without esophagitis: Secondary | ICD-10-CM

## 2019-07-02 DIAGNOSIS — R1310 Dysphagia, unspecified: Secondary | ICD-10-CM

## 2019-07-02 NOTE — Progress Notes (Signed)
Jonathon Bellows MD, MRCP(U.K) [address]  [place]  [place], [place] [address]  Main: [phone]  Fax: [phone]   Gastroenterology Consultation  Referring Provider:     Crecencio Mc, MD Primary Care Physician:  Crecencio Mc, MD Primary Gastroenterologist:  Dr. Jonathon Bellows  Reason for Consultation:     Dysphagia        HPI:   Dustin [name] is a 63 y.o. y/o male referred for consultation & management  by Dr. Crecencio Mc, MD.   He recently underwent a barium esophagram on 05/21/2019 which showed a small hiatal hernia but otherwise no strictures.   Dysphagia: Onset and any progression: *Ongoing since the beginning of the year, not worse.  Does not occur every day on and off.  Affect solids and liquids.  Feels like a sensation of roughness at the bottom of his throat.  No recent episodes of impaction.  Denies any asthma or allergy.  History of heartburn and reflux which is worse at night and he does feel at times with choking on liquid secretions.  He has gained weight since the beginning of the year.  Does not think that the symptoms are any worse after gaining weight.  He has had an endoscopy a year back by Dr. Vira Agar at [facility].  He has been told to take omeprazole twice a day but only takes it once a day.  Taking once a day his symptoms of acid reflux in terms of heartburn are well controlled but he has occasional episodes which he takes Tums for.   His other issue he would like to discuss with me today is that he feels that after a bowel movement he has to get a clean excessively and if they will still sticking to the anus.  Denies any perianal itching.  Denies any rectal bleeding.  He has a bowel movement daily.  Diet not very adequate in fiber.  Past Medical History:  Diagnosis Date  . Cerebral aneurysm   . History of colonic polyps   . Hypertension   . Prolactinoma ([place]) 08/04  . 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 ABDOMINAL EXPLORATION SURGERY  1978  . APPENDECTOMY     at same time as lymph node dissection  . ESOPHAGOGASTRODUODENOSCOPY  03/08    and colon  . INGUINAL HERNIA REPAIR  child   bilateral  . MOHS SURGERY Left nose   at [facility]--- 2013  . prolactinoma excision  09/04   [name]  . right testicle removed  1978  . SHOULDER OPEN ROTATOR CUFF REPAIR Left 6/17   and biceps repair--[facility]  . stress nuclear  negative   10/09  . TONSILLECTOMY      Prior to Admission medications   Medication Sig Start Date End Date Taking? Authorizing Provider  ALPRAZolam (XANAX) 0.25 MG tablet TAKE 1 TABLET BY MOUTH TWICE A DAY AS NEEDED FOR ANXIETY Patient not taking: Reported on 05/10/2019 01/02/18   Crecencio Mc, MD  budesonide (RHINOCORT ALLERGY) 32 MCG/ACT nasal spray Place 2 sprays into both nostrils daily. Reported on 03/31/2016    [provider]  buPROPion (WELLBUTRIN XL) 300 MG 24 hr tablet Take 1 tablet by mouth once daily 05/15/19   Crecencio Mc, MD  cyanocobalamin (,VITAMIN B-12,) 1000 MCG/ML injection Inject 1 mL (1,000 mcg total) into the muscle every 14 (fourteen) days. 05/14/19   Deborra Medina  L, MD  hydrochlorothiazide (HYDRODIURIL) 25 MG tablet Take 1 tablet by mouth once daily 04/09/19   Crecencio Mc, MD  losartan (COZAAR) 50 MG tablet Take 1 tablet by mouth once daily 04/09/19   Crecencio Mc, MD  omeprazole (PRILOSEC) 40 MG capsule Take 1 capsule (40 mg total) by mouth 2 (two) times a day. 05/10/19   Crecencio Mc, MD  sildenafil (REVATIO) 20 MG tablet Take 1 tablet (20 mg total) by mouth 3 (three) times daily. 05/10/19   Crecencio Mc, MD  Vitamin D, Ergocalciferol, (DRISDOL) 1.25 MG (50000 UT) CAPS capsule Take 1 capsule (50,000 Units total) by mouth every 14 (fourteen) days. 05/15/19   Crecencio Mc, MD    Family History  Problem Relation Age of Onset  . AAA (abdominal aortic aneurysm) Father   . Cancer Paternal Aunt        lung  .  Cancer Other        strong in family  . Cancer Brother        melanoma  . Cerebral aneurysm Paternal Grandfather      Social History   Tobacco Use  . Smoking status: Former Smoker    Packs/day: 1.50    Years: 40.00    Pack years: 60.00    Types: Cigarettes    Quit date: 04/25/2012    Years since quitting: 7.1  . Smokeless tobacco: Never Used  Substance Use Topics  . Alcohol use: Yes    Alcohol/week: 7.0 standard drinks    Types: 7 Standard drinks or equivalent per week    Comment: occassionally  . Drug use: No    Allergies as of 07/02/2019 - Review Complete 05/10/2019  Allergen Reaction Noted  . Amlodipine Swelling 12/06/2016  . Varenicline tartrate      Review of Systems:    All systems reviewed and negative except where noted in HPI.   Physical Exam:  There were no vitals taken for this visit. No LMP for male patient. Psych:  Alert and cooperative. Normal mood and affect. General:   Alert,  Well-developed, well-nourished, pleasant and cooperative in NAD Head:  Normocephalic and atraumatic. Eyes:  Sclera clear, no icterus.   Conjunctiva pink. Ears:  Normal auditory acuity. Nose:  No deformity, discharge, or lesions. Mouth:  No deformity or lesions,oropharynx pink & moist. Neck:  Supple; no masses or thyromegaly. Lungs:  Respirations even and unlabored.  Clear throughout to auscultation.   No wheezes, crackles, or rhonchi. No acute distress. Heart:  Regular rate and rhythm; no murmurs, clicks, rubs, or gallops. Abdomen:  Normal bowel sounds.  No bruits.  Soft, non-tender and non-distended without masses, hepatosplenomegaly or hernias noted.  No guarding or rebound tenderness.    Neurologic:  Alert and oriented x3;  grossly normal neurologically. Skin:  Intact without significant lesions or rashes. No jaundice. Lymph Nodes:  No significant cervical adenopathy. Psych:  Alert and cooperative. Normal mood and affect.  Imaging Studies: No results found.  Assessment  and Plan:   Dustin [name] is a 63 y.o. y/o male has been referred for dysphagia.  Recent barium esophagram demonstrated no obstruction but a small hiatal hernia was noted.  Differentials for dysphagia include esophageal spasm secondary to acid reflux versus conditions such as eosinophilic esophagitis which is very common.  Sometimes a subtle Schatzki's ring may not also be visible on the barium.  I counseled him about lifestyle changes for acid reflux.  Also discussed with him conservative management  of hemorrhoids.  Did discuss about a sitz bath, Anusol suppositories.  I do not think he requires any definite therapy for hemorrhoids since he is not having any symptoms of any rectal bleeding.  Plan 1. EGD plus biopsies of the esophagus.  If Schatzki's ring seen will be dilated. 2. GERD : Counseled on life style changes, suggest to use PPI first thing in the morning on empty stomach and eat 30 minutes after. Advised on the use of a wedge pillow at night , avoid meals for 2 hours prior to bed time. Weight loss .Discussed the risks and benefits of long term PPI use including but not limited to bone loss, chronic kidney disease, infections , low magnesium . Aim to use at the lowest dose for the shortest period of time.  Based on the results of his endoscopy I may even decrease the dose of his omeprazole if there is no active inflammation to 20 mg once a day. 3.  Hemorrhoids patient information 4.  High-fiber diet patient information 5.  Provided samples of fiber pills    I have discussed alternative options, risks & benefits,  which include, but are not limited to, bleeding, infection, perforation,respiratory complication & drug reaction.  The patient agrees with this plan & written consent will be obtained.     Follow up in 6 to 8 weeks  Dr Jonathon Bellows MD,MRCP(U.K)

## 2019-07-02 NOTE — Patient Instructions (Signed)

## 2019-07-28 ENCOUNTER — Other Ambulatory Visit: Payer: Self-pay | Admitting: Internal Medicine

## 2019-08-08 ENCOUNTER — Ambulatory Visit: Payer: Managed Care, Other (non HMO) | Admitting: Gastroenterology

## 2019-09-09 ENCOUNTER — Encounter (HOSPITAL_COMMUNITY): Payer: Self-pay | Admitting: Emergency Medicine

## 2019-09-09 ENCOUNTER — Other Ambulatory Visit: Payer: Self-pay

## 2019-09-09 ENCOUNTER — Emergency Department (HOSPITAL_COMMUNITY)
Admission: EM | Admit: 2019-09-09 | Discharge: 2019-09-09 | Disposition: A | Discharge status: Home / Self Care | Payer: Managed Care, Other (non HMO) | Attending: Emergency Medicine | Admitting: Emergency Medicine

## 2019-09-09 ENCOUNTER — Emergency Department (HOSPITAL_COMMUNITY): Payer: Managed Care, Other (non HMO)

## 2019-09-09 DIAGNOSIS — Z87891 Personal history of nicotine dependence: Secondary | ICD-10-CM | POA: Insufficient documentation

## 2019-09-09 DIAGNOSIS — Z79899 Other long term (current) drug therapy: Secondary | ICD-10-CM | POA: Insufficient documentation

## 2019-09-09 DIAGNOSIS — I10 Essential (primary) hypertension: Secondary | ICD-10-CM | POA: Insufficient documentation

## 2019-09-09 DIAGNOSIS — M7918 Myalgia, other site: Secondary | ICD-10-CM | POA: Diagnosis not present

## 2019-09-09 DIAGNOSIS — M542 Cervicalgia: Secondary | ICD-10-CM | POA: Insufficient documentation

## 2019-09-09 MED ORDER — METHOCARBAMOL 500 MG PO TABS: 500.0000 mg | ORAL_TABLET | Freq: Two times a day (BID) | ORAL | 0 refills | Status: DC

## 2019-09-09 MED ORDER — IBUPROFEN 400 MG PO TABS
600.0000 mg | ORAL_TABLET | Freq: Once | ORAL | Status: AC
  Administered 2019-09-09: 600 mg via ORAL
  Filled 2019-09-09: qty 1

## 2019-09-09 MED ORDER — MORPHINE SULFATE (PF) 4 MG/ML IV SOLN
4.0000 mg | Freq: Once | INTRAVENOUS | Status: AC
  Administered 2019-09-09: 4 mg via INTRAMUSCULAR
  Filled 2019-09-09: qty 1

## 2019-09-09 MED ORDER — PREDNISONE 10 MG (21) PO TBPK: ORAL_TABLET | Freq: Every day | ORAL | 0 refills | Status: DC

## 2019-09-09 NOTE — ED Notes (Signed)
Pt wife approached this RN and Ryland RN (during report) at work station saying her husband wanted to leave if he was not going to be seen. This RN and Ryland, RN explained that a PA had just signed up for him and would be in shortly, and attempted to persuade them to stay.

## 2019-09-09 NOTE — Discharge Instructions (Signed)
Use warm compresses to your posterior neck as well as gentle stretching.  As discussed.  You may use Tylenol ibuprofen as we discussed.  Use Robaxin as prescribed.  This medication may make you drowsy.  Please take prednisone in the morning with your first meal.  To ED if you have any new or concerning symptoms including weakness or numbness in your arms.  Also any sensation loss or concerning symptoms.

## 2019-09-09 NOTE — ED Notes (Signed)
Pt is

## 2019-09-09 NOTE — ED Provider Notes (Signed)
[facility] EMERGENCY DEPARTMENT Provider Note   CSN: [id number] Arrival date & time: 09/09/19  1717     History   Chief Complaint Chief Complaint  Patient presents with   Neck Pain    HPI Dustin [name] is a 63 y.o. male with history of traumatic neck injury 20 years ago with associated spinal cord edema, carpal tunnel status post surgery right wrist, chronic neck pain, hypertension     HPI  Patient presents for sudden onset of right-sided posterior and lateral neck pain that began last night when he was at rest.  Patient states that for the past 3 weeks he has had "a crick in his neck "that came on gradually.  Patient states that he figured this was worsening of his chronic neck pain however he states that he has never had any pain come on this suddenly or severely before.  Patient states that as long as he does not move his head the pain is not present.  Patient states that once the pain occurs it can last for several seconds until he is able to find position of comfort.  Often this feels like his neck cannot support his head's weight.   Patient states that he had a neck injury where he fell off a horse 20 years ago and was seen at [facility] where he was told that he did not need surgery but that he would have significant spinal cord edema.  Patient states that approximately 3 years ago he had MRIs done of his cervical spine due to a work-up at [facility] for sensation loss in his right hand which was later found to be carpal tunnel.  Patient states that at that time the neurosurgeons signed off on his case after repeat MRI that was done due to concern for a lesion on his spinal cord which patient states was found to be chronic residual scarring from his fall 20 years ago. Patient states additional tests were recommended but he declined.   Patient denies any weakness, paraesthesias, slurred speech, pain in arms or legs. Denies difficulty breathing. Denies  recent trauma, headache, vision changes or slurred speech.    Patient states before he has taken oxycodone yesterday, cyclobenzaprine, and Xanax as well as Tylenol without relief.   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    Patient Active Problem List   Diagnosis Date Noted   BPH (benign prostatic hyperplasia) 08/20/2017   B12 deficiency 08/20/2017   Vitamin D deficiency 08/20/2017   Venous insufficiency of both lower extremities 11/19/2016   Venous insufficiency of left lower extremity 11/19/2016   Edema 11/18/2016   History of tobacco abuse 11/18/2016   Polyarthritis 11/18/2016   Hemorrhoids 09/13/2016   FHx: AAA 06/28/2016   Actinic keratosis 12/29/2015   Left shoulder pain 12/29/2015   Encounter for general adult medical examination with abnormal findings 10/26/2011   Hyperlipidemia LDL goal <100 09/22/2009   GERD 07/12/2008   ERECTILE DYSFUNCTION, 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   Essential hypertension, benign 06/08/2007   CEREBRAL ANEURYSM 06/08/2007   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    Past Surgical History:  Procedure Laterality Date   ABDOMINAL EXPLORATION SURGERY  1978   APPENDECTOMY     at same time as lymph node dissection   ESOPHAGOGASTRODUODENOSCOPY  03/08    and colon  INGUINAL HERNIA REPAIR  child   bilateral   MOHS SURGERY Left nose   at [facility]--- 2013   prolactinoma excision  09/04   [facility]   right testicle removed  1978   SHOULDER OPEN ROTATOR CUFF REPAIR Left 6/17   and biceps repair--[facility]   stress nuclear  negative   10/09   TONSILLECTOMY          Home Medications    Prior to Admission medications   Medication Sig Start Date End Date Taking? Authorizing Provider  ALPRAZolam (XANAX) 0.25 MG tablet TAKE 1 TABLET BY MOUTH TWICE A DAY AS NEEDED FOR ANXIETY Patient  not taking: Reported on 05/10/2019 01/02/18   Crecencio Mc, MD  budesonide (RHINOCORT ALLERGY) 32 MCG/ACT nasal spray Place 2 sprays into both nostrils daily. Reported on 03/31/2016    [provider]  buPROPion (WELLBUTRIN XL) 300 MG 24 hr tablet Take 1 tablet by mouth once daily 05/15/19   Crecencio Mc, MD  cyanocobalamin (,VITAMIN B-12,) 1000 MCG/ML injection Inject 1 mL (1,000 mcg total) into the muscle every 14 (fourteen) days. Patient not taking: Reported on 07/02/2019 05/14/19   Crecencio Mc, MD  hydrochlorothiazide (HYDRODIURIL) 25 MG tablet Take 1 tablet by mouth once daily 07/03/19   Crecencio Mc, MD  losartan (COZAAR) 50 MG tablet Take 1 tablet by mouth once daily 07/30/19   Crecencio Mc, MD  methocarbamol (ROBAXIN) 500 MG tablet Take 1 tablet (500 mg total) by mouth 2 (two) times daily. 09/09/19   Tedd Sias, PA  omeprazole (PRILOSEC) 40 MG capsule Take 1 capsule (40 mg total) by mouth 2 (two) times a day. 05/10/19   Crecencio Mc, MD  predniSONE (STERAPRED UNI-PAK 21 TAB) 10 MG (21) TBPK tablet Take by mouth daily. Take 6 tabs by mouth daily  for 2 days, then 5 tabs for 2 days, then 4 tabs for 2 days, then 3 tabs for 2 days, 2 tabs for 2 days, then 1 tab by mouth daily for 2 days 09/09/19   Pati Gallo S, PA  sildenafil (REVATIO) 20 MG tablet Take 1 tablet (20 mg total) by mouth 3 (three) times daily. 05/10/19   Crecencio Mc, MD  Vitamin D, Ergocalciferol, (DRISDOL) 1.25 MG (50000 UT) CAPS capsule Take 1 capsule (50,000 Units total) by mouth every 14 (fourteen) days. 05/15/19   Crecencio Mc, MD    Family History Family History  Problem Relation Age of Onset   AAA (abdominal aortic aneurysm) Father    Cancer Paternal Aunt        lung   Cancer Other        strong in family   Cancer Brother        melanoma   Cerebral aneurysm Paternal Grandfather     Social History Social History   Tobacco Use   Smoking status: Former Smoker    Packs/day:  1.50    Years: 40.00    Pack years: 60.00    Types: Cigarettes    Quit date: 04/25/2012    Years since quitting: 7.3   Smokeless tobacco: Never Used  Substance Use Topics   Alcohol use: Yes    Alcohol/week: 7.0 standard drinks    Types: 7 Standard drinks or equivalent per week    Comment: occassionally   Drug use: No     Allergies   Amlodipine and Varenicline tartrate   Review of Systems Review of Systems  Constitutional: Negative for fever.  HENT: Negative for congestion.   Respiratory: Negative for cough, chest tightness and shortness of breath.   Musculoskeletal: Positive for neck pain. Negative for back pain.  Neurological: Negative for dizziness, tremors, seizures, syncope, facial asymmetry, speech difficulty, weakness, light-headedness and headaches.  All other systems reviewed and are negative.    Physical Exam Updated Vital Signs BP (!) 165/97 (BP Location: Right Arm)    Pulse 87    Temp 98 F (36.7 C) (Oral)    Resp 18    Ht 5\' 9"  (1.753 m)    Wt 104.3 kg    SpO2 95%    BMI 33.97 kg/m   Physical Exam Vitals signs and nursing note reviewed.  Constitutional:      General: He is not in acute distress. HENT:     Head: Normocephalic and atraumatic.     Nose: Nose normal.  Eyes:     General: No scleral icterus. Neck:     Musculoskeletal: Normal range of motion.     Comments: Neck with decreased range of motion.  Unable to look past midline towards right.  Patient able to turn head completed to the left. Able to extend neck.  Can flex but not entirely to chest. Looking right elicits severe pain. Pain is localized to the right posterior lateral neck.  No midline tenderness.  No reproducible tenderness to palpation over the muscles of the right side of the neck. Cardiovascular:     Rate and Rhythm: Normal rate.     Pulses: Normal pulses.  Pulmonary:     Effort: Pulmonary effort is normal. No respiratory distress.  Musculoskeletal:     Right lower leg: No  edema.     Left lower leg: No edema.  Skin:    General: Skin is warm and dry.     Capillary Refill: Capillary refill takes less than 2 seconds.     Comments: No rashes of the neck or chest or back  Neurological:     General: No focal deficit present.     Mental Status: He is alert. Mental status is at baseline.     Comments: Alert and oriented to self, place, time and event.   Speech is fluent, clear without dysarthria or dysphasia.   Strength 5/5 in upper/lower extremities  Sensation intact in upper/lower extremities   Normal gait.  Negative Romberg. No pronator drift.  Normal finger-to-nose and feet tapping.  CN I not tested  CN II grossly intact visual fields bilaterally. Did not visualize posterior eye.   CN [name], IV, VI PERRLA and EOMs intact bilaterally  CN V Intact sensation to sharp and light touch to the face  CN VII facial movements symmetric  CN VIII not tested  CN IX, X no uvula deviation, symmetric rise of soft palate  CN XI 5/5 SCM and trapezius strength bilaterally  CN XII Midline tongue protrusion, symmetric L/R movements   Psychiatric:        Mood and Affect: Mood normal.        Behavior: Behavior normal.      ED Treatments / Results  Labs (all labs ordered are listed, but only abnormal results are displayed) Labs Reviewed - No data to display  EKG None  Radiology Ct Cervical Spine Wo Contrast  Result Date: 09/09/2019 CLINICAL DATA:  Right-sided neck pain.  No known injury. EXAM: CT CERVICAL SPINE WITHOUT CONTRAST TECHNIQUE: Multidetector CT imaging of the cervical spine was performed without intravenous contrast. Multiplanar CT image reconstructions were also generated.  COMPARISON:  None. FINDINGS: Alignment: Normal anatomic alignment. Skull base and vertebrae: No acute fracture. No primary bone lesion or focal pathologic process. Soft tissues and spinal canal: No prevertebral fluid or swelling. No visible canal hematoma. Disc levels: Multilevel  degenerative disc disease most pronounced C5-6 and C6-7. Right C3-4 facet degenerative changes. This results in moderate right C3-4 foraminal narrowing. Facet degenerative changes result in mild left C3-4 foraminal narrowing. No evidence for acute fracture. Upper chest: Unremarkable Other: Unremarkable. IMPRESSION: No acute cervical spine fracture. Multilevel degenerative disc and facet disease. Moderate right C3-4 neural foraminal narrowing secondary to facet degenerative changes. Electronically Signed   By: Lovey Newcomer M.D.   On: 09/09/2019 21:07    Procedures Procedures (including critical care time)  Medications Ordered in ED Medications  morphine 4 MG/ML injection 4 mg (4 mg Intramuscular Given 09/09/19 2012)  ibuprofen (ADVIL) tablet 600 mg (600 mg Oral Given 09/09/19 2012)     Initial Impression / Assessment and Plan / ED Course  I have reviewed the triage vital signs and the nursing notes.  Pertinent labs & imaging results that were available during my care of the patient were reviewed by me and considered in my medical decision making (see chart for details).        Patient is 63 year old male with history of chronic neck problems who has never undergone surgery. Has history of neck trauma 20 years prior and has had multiple MRI studies done since then. States he has had worsening "crick" in his neck the last few days and worse today. Decreased ROM. Concern for fracture/acute change.   Patient has no neurologic sypmtoms concerning for cord compression and denies any radicular sx. CT shows degenerative diz but no acute fracture or change.   Mildly improved sx on reevaluation. Has received ibuprofen 600 and morphine 4 IM. Will FU with orthopedist.  Will discharge with Robaxin and prednisone taper.  Patient plans to see chiropractor tomorrow.  Patient will follow up with orthopedist.   ON MY REVIEW OF EMR 07/06/2017 MR thoracic spine w/ and w/o  IMPRESSION:  Anterior  positioning of the cervical spinal cord at T2 with nonenhancing, expansile T2 hyperintensity in the spinal cord extending from C7 through T2 and left central disc protrusion at T1-2. 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 Recommend further evaluation with CT myelogram of the cervical and thoracic spine.  07/04/2017 MR cervical spine without Incompletely evaluated spinal cord lesion extending caudally from C6-C7 beyond the field-of-view. Top differential considerations include glial neoplasm, ependymoma, or demyelinating processes. Further evaluation with contrasted thoracic spine MRI is recommended.   Final Clinical Impressions(s) / ED Diagnoses   Final diagnoses:  Neck pain  Musculoskeletal pain    ED Discharge Orders         Ordered    methocarbamol (ROBAXIN) 500 MG tablet  2 times daily     09/09/19 2148    predniSONE (STERAPRED UNI-PAK 21 TAB) 10 MG (21) TBPK tablet  Daily     09/09/19 2148           Pati Gallo [place], [place] 09/10/19 0341    Blanchie Dessert, MD 09/11/19 2059

## 2019-09-09 NOTE — ED Notes (Signed)
Pt stating he wanted to leave. This RN attempted to convince him to stay. Pt was adamant about wanting to leave without being seen.

## 2019-09-09 NOTE — ED Triage Notes (Addendum)
Pt c/o pain in right side of neck and tingling in right shoulder onset 3 weeks ago worse since last night.  No known injury  Pt st's he injured his neck approx 20 years ago but has not had pain like this in the past.

## 2019-09-10 ENCOUNTER — Other Ambulatory Visit: Payer: Self-pay | Admitting: Internal Medicine

## 2019-09-10 DIAGNOSIS — E538 Deficiency of other specified B group vitamins: Secondary | ICD-10-CM

## 2019-09-10 DIAGNOSIS — E559 Vitamin D deficiency, unspecified: Secondary | ICD-10-CM

## 2019-09-12 NOTE — Telephone Encounter (Signed)
Refill on Vitamin D denied. MyChart message sent :  "The Mega dose of Vitamin D is  not refillable without recheck on level because it is fat soluble and therefore can be neurotoxic .  I suggest taking 2000 Ius of OTC Vit D3 until you have time to recheck your level"  Also appears to have stopped his  b12 injections prematurely? Or he is gettign them at [facility]  Component Value Date   VITAMINB12 200 (L) 05/10/2019

## 2019-09-12 NOTE — Telephone Encounter (Signed)
Refilled: 05/15/2019 Last OV: 05/10/2019 Next OV: not scheduled

## 2019-09-20 ENCOUNTER — Other Ambulatory Visit: Payer: Self-pay | Admitting: Internal Medicine

## 2019-10-29 ENCOUNTER — Other Ambulatory Visit: Payer: Self-pay | Admitting: Internal Medicine

## 2019-11-19 ENCOUNTER — Other Ambulatory Visit: Payer: Self-pay | Admitting: Internal Medicine

## 2019-11-22 ENCOUNTER — Telehealth: Payer: Self-pay

## 2019-11-22 NOTE — Telephone Encounter (Signed)
Dustin [name] is inquiring again if you will take [name] as a patient

## 2019-11-22 NOTE — Telephone Encounter (Signed)
Dustin [name] [phone] Dr Rosanna Randy said he would see him as a new patient.   Please call him to schedule.   Thanks

## 2019-11-22 NOTE — Telephone Encounter (Signed)
ok 

## 2019-11-26 ENCOUNTER — Other Ambulatory Visit: Payer: Self-pay | Admitting: Internal Medicine

## 2019-11-29 ENCOUNTER — Ambulatory Visit: Payer: Self-pay | Admitting: Family Medicine

## 2019-12-04 ENCOUNTER — Ambulatory Visit: Payer: Self-pay | Admitting: Family Medicine

## 2019-12-04 ENCOUNTER — Ambulatory Visit (INDEPENDENT_AMBULATORY_CARE_PROVIDER_SITE_OTHER): Payer: Managed Care, Other (non HMO) | Admitting: Family Medicine

## 2019-12-04 ENCOUNTER — Other Ambulatory Visit: Payer: Self-pay

## 2019-12-04 ENCOUNTER — Encounter: Payer: Self-pay | Admitting: Family Medicine

## 2019-12-04 VITALS — BP 152/80 | HR 88 | Temp 97.1°F | Resp 18 | Ht 70.0 in | Wt 235.0 lb

## 2019-12-04 DIAGNOSIS — I872 Venous insufficiency (chronic) (peripheral): Secondary | ICD-10-CM

## 2019-12-04 DIAGNOSIS — D352 Benign neoplasm of pituitary gland: Secondary | ICD-10-CM

## 2019-12-04 DIAGNOSIS — K649 Unspecified hemorrhoids: Secondary | ICD-10-CM | POA: Diagnosis not present

## 2019-12-04 DIAGNOSIS — E785 Hyperlipidemia, unspecified: Secondary | ICD-10-CM

## 2019-12-04 DIAGNOSIS — D353 Benign neoplasm of craniopharyngeal duct: Secondary | ICD-10-CM

## 2019-12-04 DIAGNOSIS — I10 Essential (primary) hypertension: Secondary | ICD-10-CM | POA: Diagnosis not present

## 2019-12-04 DIAGNOSIS — Z8547 Personal history of malignant neoplasm of testis: Secondary | ICD-10-CM

## 2019-12-04 NOTE — Patient Instructions (Signed)
Try Metamucil daily 

## 2019-12-04 NOTE — Progress Notes (Signed)
Patient: Dustin [name], Male    DOB: 02/16/1956, 64 y.o.   MRN: [id number] Visit Date: 12/04/2019  Today's Provider: Wilhemena Durie, MD   Chief Complaint  Patient presents with  . Establish Care  . Annual Exam   Subjective:     Get established Dustin [name] is a 64 y.o. male who presents today for health maintenance  He feels well. He reports exercising none. He reports he is sleeping has issues staying asleep. He is single but has a significant other, Francis Dowse who we care for.  Is in today to be established.  He quit smoking in 2011.  Status post right total hip replacement in November 2019.  He is looking to have thumb surgery by Dr. Delfino Lovett at [facility].  He is trying to make lifestyle changes to lose weight and he gets more exercise. His only complaint today is 1 of burning in the right axilla.  No rash no trauma no back/neck  pain and no obvious mass or adenopathy.  No alarm symptoms at all.    Review of Systems  Constitutional: Negative.   Eyes: Negative.   Respiratory: Negative.   Cardiovascular: Negative.   Gastrointestinal:       He has had loose stools she describes as messy in the last few years.  Endocrine: Negative.   Musculoskeletal: Positive for arthralgias, myalgias, neck pain and neck stiffness.  Allergic/Immunologic: Negative.   Neurological: Positive for light-headedness.  Psychiatric/Behavioral: Negative.   All other systems reviewed and are negative.   Social History      He  reports that he quit smoking about 7 years ago. His smoking use included cigarettes. He has a 60.00 pack-year smoking history. He has never used smokeless tobacco. He reports current alcohol use of about 7.0 standard drinks of alcohol per week. He reports that he does not use drugs.       Social History   Socioeconomic History  . Marital status: Significant Other    Spouse name: Not on file  . Number of children: 0  . Years of education: Not on file  .  Highest education level: Not on file  Occupational History  . Occupation: Scientist, physiological  Tobacco Use  . Smoking status: Former Smoker    Packs/day: 1.50    Years: 40.00    Pack years: 60.00    Types: Cigarettes    Quit date: 04/25/2012    Years since quitting: 7.6  . Smokeless tobacco: Never Used  Substance and Sexual Activity  . Alcohol use: Yes    Alcohol/week: 7.0 standard drinks    Types: 7 Standard drinks or equivalent per week    Comment: occassionally  . Drug use: No  . Sexual activity: Yes    Partners: Female  Other Topics Concern  . Not on file  Social History Narrative             Social Determinants of Health   Financial Resource Strain:   . Difficulty of Paying Living Expenses: Not on file  Food Insecurity:   . Worried About Charity fundraiser in the Last Year: Not on file  . Ran Out of Food in the Last Year: Not on file  Transportation Needs:   . Lack of Transportation (Medical): Not on file  . Lack of Transportation (Non-Medical): Not on file  Physical Activity:   . Days of Exercise per Week: Not on file  . Minutes of Exercise  per Session: Not on file  Stress:   . Feeling of Stress : Not on file  Social Connections:   . Frequency of Communication with Friends and Family: Not on file  . Frequency of Social Gatherings with Friends and Family: Not on file  . Attends Religious Services: Not on file  . Active Member of Clubs or Organizations: Not on file  . Attends Archivist Meetings: Not on file  . Marital Status: Not on file    Past Medical History:  Diagnosis Date  . Arthritis   . Cerebral aneurysm   . History of colonic polyps   . Hypertension   . 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 Testicular cancer [facility]) 1978   teratocarcinoma     Patient Active Problem List   Diagnosis Date Noted  . BPH (benign prostatic hyperplasia) 08/20/2017  . B12 deficiency 08/20/2017  . Vitamin D deficiency 08/20/2017  . Venous insufficiency of both  lower extremities 11/19/2016  . Venous insufficiency of left lower extremity 11/19/2016  . Edema 11/18/2016  . History of tobacco abuse 11/18/2016  . Polyarthritis 11/18/2016  . Hemorrhoids 09/13/2016  . FHx: AAA 06/28/2016  . Actinic keratosis 12/29/2015  . Left shoulder pain 12/29/2015  . Encounter for general adult medical examination with abnormal findings 10/26/2011  . Hyperlipidemia LDL goal <100 09/22/2009  . GERD 07/12/2008  . 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 Essential hypertension, benign 06/08/2007  . CEREBRAL ANEURYSM 06/08/2007  . 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 ABDOMINAL EXPLORATION SURGERY  1978  . APPENDECTOMY     at same time as lymph node dissection  . ESOPHAGOGASTRODUODENOSCOPY  03/08    and colon  . INGUINAL HERNIA REPAIR  child   bilateral  . JOINT REPLACEMENT    . MOHS SURGERY Left nose   at [facility]--- 2013  . prolactinoma excision  09/04   [name]  . right testicle removed  1978  . SHOULDER OPEN ROTATOR CUFF REPAIR Left 6/17   and biceps repair--[facility]  . stress nuclear  negative   10/09  . TONSILLECTOMY      Family History        Family Status  Relation Name Status  . Father  Deceased       suicide, had glioblastoma and aortic aneurysm  . Ethlyn Daniels  Deceased  . Other Family Alive  . Brother  (Not Specified)  . PGF  (Not Specified)  . Mother  (Not Specified)        His family history includes AAA (abdominal aortic aneurysm) in his father; Cancer in his brother, paternal aunt, and another family member; Cerebral aneurysm in his paternal grandfather; Osteoporosis in his mother.      Allergies  Allergen Reactions  . Amlodipine Swelling    LE EDEMA  . Varenicline Tartrate      Current Outpatient Medications:  .  buPROPion (WELLBUTRIN XL) 300 MG 24 hr tablet, Take 1 tablet by mouth once daily, Disp: 90 tablet, Rfl: 0 .  hydrochlorothiazide (HYDRODIURIL) 25 MG  tablet, Take 1 tablet by mouth once daily, Disp: 90 tablet, Rfl: 1 .  Ibuprofen (ADVIL PO), Take by mouth., Disp: , Rfl:  .  losartan (COZAAR) 50 MG tablet, Take 1 tablet by mouth once daily, Disp: 90 tablet, Rfl: 1 .  omeprazole (PRILOSEC) 40 MG capsule, Take 1 capsule (40 mg total) by mouth 2 (two) times a day. (Patient taking differently: Take 40 mg  by mouth daily. ), Disp: 180 capsule, Rfl: 2 .  sildenafil (REVATIO) 20 MG tablet, Take 1 tablet (20 mg total) by mouth 3 (three) times daily., Disp: 90 tablet, Rfl: 3 .  ALPRAZolam (XANAX) 0.25 MG tablet, TAKE 1 TABLET BY MOUTH TWICE A DAY AS NEEDED FOR ANXIETY (Patient not taking: Reported on 05/10/2019), Disp: 30 tablet, Rfl: 2 .  budesonide (RHINOCORT ALLERGY) 32 MCG/ACT nasal spray, Place 2 sprays into both nostrils daily. Reported on 03/31/2016, Disp: , Rfl:  .  cyanocobalamin (,VITAMIN B-12,) 1000 MCG/ML injection, Inject 1 mL (1,000 mcg total) into the muscle every 14 (fourteen) days. (Patient not taking: Reported on 07/02/2019), Disp: 2 mL, Rfl: 11 .  methocarbamol (ROBAXIN) 500 MG tablet, Take 1 tablet (500 mg total) by mouth 2 (two) times daily., Disp: 20 tablet, Rfl: 0 .  predniSONE (STERAPRED UNI-PAK 21 TAB) 10 MG (21) TBPK tablet, Take by mouth daily. Take 6 tabs by mouth daily  for 2 days, then 5 tabs for 2 days, then 4 tabs for 2 days, then 3 tabs for 2 days, 2 tabs for 2 days, then 1 tab by mouth daily for 2 days, Disp: 42 tablet, Rfl: 0 .  Vitamin D, Ergocalciferol, (DRISDOL) 1.25 MG (50000 UT) CAPS capsule, Take 1 capsule (50,000 Units total) by mouth every 14 (fourteen) days., Disp: 6 capsule, Rfl: 0   Patient Care Team: Jerrol Banana., MD as PCP - General (Family Medicine)    Objective:    Vitals: BP (!) 152/80 (BP Location: Right Arm, Patient Position: Sitting, Cuff Size: Large)   Pulse 88   Temp (!) 97.1 F (36.2 C) (Other (Comment))   Resp 18   Ht 5\' 10"  (1.778 m)   Wt 235 lb (106.6 kg)   SpO2 95%   BMI 33.72  kg/m    Vitals:   12/04/19 1537  BP: (!) 152/80  Pulse: 88  Resp: 18  Temp: (!) 97.1 F (36.2 C)  TempSrc: Other (Comment)  SpO2: 95%  Weight: 235 lb (106.6 kg)  Height: 5\' 10"  (1.778 m)     Physical Exam Vitals reviewed.  HENT:     Head: Normocephalic and atraumatic.     Right Ear: External ear normal.     Left Ear: External ear normal.     Nose: Nose normal.  Eyes:     General: No scleral icterus.    Conjunctiva/sclera: Conjunctivae normal.  Cardiovascular:     Rate and Rhythm: Normal rate and regular rhythm.     Pulses: Normal pulses.     Heart sounds: Normal heart sounds.  Pulmonary:     Effort: Pulmonary effort is normal.     Breath sounds: Normal breath sounds.  Abdominal:     Palpations: Abdomen is soft.  Musculoskeletal:     Right lower leg: No edema.     Left lower leg: No edema.  Lymphadenopathy:     Cervical: No cervical adenopathy.  Skin:    General: Skin is warm and dry.  Neurological:     General: No focal deficit present.     Mental Status: He is alert and oriented to person, place, and time.  Psychiatric:        Mood and Affect: Mood normal.        Behavior: Behavior normal.        Thought Content: Thought content normal.        Judgment: Judgment normal.      Depression Screen PHQ 2/9 Scores  12/04/2019 08/02/2018 07/13/2018 03/22/2018  PHQ - 2 Score 0 0 0 0  PHQ- 9 Score 6 - - -       Assessment & Plan:     Routine Health Maintenance   Exercise Activities and Dietary recommendations Goals   None     Immunization History  Administered Date(s) Administered  . Influenza Split 10/12/2011  . Td 01/17/2003, 07/09/2009  . Tdap 06/12/2014    Health Maintenance  Topic Date Due  . Hepatitis C Screening  July 05, 1956  . HIV Screening  02/01/1970  . INFLUENZA VACCINE  05/12/2019  . COLONOSCOPY  01/06/2022  . TETANUS/TDAP  06/12/2024     Discussed health benefits of physical activity, and encouraged him to engage in regular  exercise appropriate for his age and condition.   1. Essential hypertension, benign Check blood pressures at home and bring in readings on next visit.  2. Hyperlipidemia LDL goal <100   3. Venous insufficiency of both lower extremities   4. Hemorrhoids, unspecified hemorrhoid type Try Metamucil daily to help with stools.  5. PITUITARY MICROADENOMA   6. History of testicular cancer 1978  --------------------------------------------------------------------    Wilhemena Durie, MD  [facility]

## 2020-02-05 DIAGNOSIS — M19041 Primary osteoarthritis, right hand: Secondary | ICD-10-CM | POA: Insufficient documentation

## 2020-02-05 DIAGNOSIS — M654 Radial styloid tenosynovitis [de Quervain]: Secondary | ICD-10-CM | POA: Insufficient documentation

## 2020-02-05 DIAGNOSIS — M1812 Unilateral primary osteoarthritis of first carpometacarpal joint, left hand: Secondary | ICD-10-CM | POA: Insufficient documentation

## 2020-02-25 NOTE — Progress Notes (Deleted)
Medicare Initial Preventative Physical Exam   {  This SmartText note template is in development as part of the [place].   This template contains optional SmartLists. If no selections are made from an optional SmartList, it will be automatically removed when the note is signed.  Left clicking SmartLists that are in blue, underlined text will show additional information regarding the patient's history unless you are already editing the note in a pop-up window.  This text will be automatically removed from this note when it is signed.:1}   Patient: Dustin [name], Male    DOB: 21-May-1956, 64 y.o.   MRN: [id number] Visit Date: 02/25/2020  Today's Provider: Wilhemena Durie, MD   Subjective:   No chief complaint on file.   Medicare Initial Preventative Physical Exam Dustin [name] is a 64 y.o. male who presents today for his Initial Preventative Physical Exam.   HPI  Social History   Socioeconomic History  . Marital status: Significant Other    Spouse name: Not on file  . Number of children: 0  . Years of education: Not on file  . Highest education level: Not on file  Occupational History  . Occupation: Scientist, physiological  Tobacco Use  . Smoking status: Former Smoker    Packs/day: 1.50    Years: 40.00    Pack years: 60.00    Types: Cigarettes    Quit date: 04/25/2012    Years since quitting: 7.8  . Smokeless tobacco: Never Used  Substance and Sexual Activity  . Alcohol use: Yes    Alcohol/week: 7.0 standard drinks    Types: 7 Standard drinks or equivalent per week    Comment: occassionally  . Drug use: No  . Sexual activity: Yes    Partners: Female  Other Topics Concern  . Not on file  Social History Narrative             Social Determinants of Health   Financial Resource Strain:   . Difficulty of Paying Living Expenses:   Food Insecurity:   . Worried About Charity fundraiser in the Last Year:   . Arboriculturist  in the Last Year:   Transportation Needs:   . Film/video editor (Medical):   Marland Kitchen Lack of Transportation (Non-Medical):   Physical Activity:   . Days of Exercise per Week:   . Minutes of Exercise per Session:   Stress:   . Feeling of Stress :   Social Connections:   . Frequency of Communication with Friends and Family:   . Frequency of Social Gatherings with Friends and Family:   . Attends Religious Services:   . Active Member of Clubs or Organizations:   . Attends Archivist Meetings:   Marland Kitchen Marital Status:   Intimate Partner Violence:   . Fear of Current or Ex-Partner:   . Emotionally Abused:   Marland Kitchen Physically Abused:   . Sexually Abused:     Past Medical History:  Diagnosis Date  . Arthritis   . Cerebral aneurysm   . History of colonic polyps   . Hypertension   . 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 BPH (benign prostatic hyperplasia) 08/20/2017  . B12 deficiency 08/20/2017  . Vitamin D deficiency 08/20/2017  . Venous insufficiency of both lower extremities 11/19/2016  . Venous insufficiency of  left lower extremity 11/19/2016  . Edema 11/18/2016  . History of tobacco abuse 11/18/2016  . Polyarthritis 11/18/2016  . Hemorrhoids 09/13/2016  . FHx: AAA 06/28/2016  . Actinic keratosis 12/29/2015  . Left shoulder pain 12/29/2015  . Encounter for general adult medical examination with abnormal findings 10/26/2011  . Hyperlipidemia LDL goal <100 09/22/2009  . GERD 07/12/2008  . 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 Essential hypertension, benign 06/08/2007  . CEREBRAL ANEURYSM 06/08/2007  . 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 ABDOMINAL EXPLORATION SURGERY  1978  . APPENDECTOMY     at same time as lymph node dissection  . ESOPHAGOGASTRODUODENOSCOPY  03/08    and colon  . INGUINAL HERNIA REPAIR  child     bilateral  . JOINT REPLACEMENT    . MOHS SURGERY Left nose   at [facility]--- 2013  . prolactinoma excision  09/04   Dustin [name]  . right testicle removed  1978  . SHOULDER OPEN ROTATOR CUFF REPAIR Left 6/17   and biceps repair--[facility]  . stress nuclear  negative   10/09  . TONSILLECTOMY      His family history includes AAA (abdominal aortic aneurysm) in his father; Brain cancer in his father; Cancer in his brother, paternal aunt, and another family member; Cerebral aneurysm in his paternal grandfather; Osteoporosis in his mother.   Current Outpatient Medications:  .  ALPRAZolam (XANAX) 0.25 MG tablet, TAKE 1 TABLET BY MOUTH TWICE A DAY AS NEEDED FOR ANXIETY (Patient not taking: Reported on 05/10/2019), Disp: 30 tablet, Rfl: 2 .  budesonide (RHINOCORT ALLERGY) 32 MCG/ACT nasal spray, Place 2 sprays into both nostrils daily. Reported on 03/31/2016, Disp: , Rfl:  .  buPROPion (WELLBUTRIN XL) 300 MG 24 hr tablet, Take 1 tablet by mouth once daily, Disp: 90 tablet, Rfl: 0 .  cyanocobalamin (,VITAMIN B-12,) 1000 MCG/ML injection, Inject 1 mL (1,000 mcg total) into the muscle every 14 (fourteen) days. (Patient not taking: Reported on 07/02/2019), Disp: 2 mL, Rfl: 11 .  hydrochlorothiazide (HYDRODIURIL) 25 MG tablet, Take 1 tablet by mouth once daily, Disp: 90 tablet, Rfl: 1 .  Ibuprofen (ADVIL PO), Take by mouth., Disp: , Rfl:  .  losartan (COZAAR) 50 MG tablet, Take 1 tablet by mouth once daily, Disp: 90 tablet, Rfl: 1 .  methocarbamol (ROBAXIN) 500 MG tablet, Take 1 tablet (500 mg total) by mouth 2 (two) times daily., Disp: 20 tablet, Rfl: 0 .  omeprazole (PRILOSEC) 40 MG capsule, Take 1 capsule (40 mg total) by mouth 2 (two) times a day. (Patient taking differently: Take 40 mg by mouth daily. ), Disp: 180 capsule, Rfl: 2 .  predniSONE (STERAPRED UNI-PAK 21 TAB) 10 MG (21) TBPK tablet, Take by mouth daily. Take 6 tabs by mouth daily  for 2 days, then 5 tabs for 2 days, then 4 tabs for 2 days, then  3 tabs for 2 days, 2 tabs for 2 days, then 1 tab by mouth daily for 2 days, Disp: 42 tablet, Rfl: 0 .  sildenafil (REVATIO) 20 MG tablet, Take 1 tablet (20 mg total) by mouth 3 (three) times daily., Disp: 90 tablet, Rfl: 3 .  Vitamin D, Ergocalciferol, (DRISDOL) 1.25 MG (50000 UT) CAPS capsule, Take 1 capsule (50,000 Units total) by mouth every 14 (fourteen) days., Disp: 6 capsule, Rfl: 0   Patient Care Team: Jerrol Banana., MD as PCP - General (Family Medicine)  Review of Systems  {Show previous  labs (optional):23779::" "}   Objective:    Vitals: There were no vitals taken for this visit. No exam data present Physical Exam ***  Activities of Daily Living No flowsheet data found.  Fall Risk Assessment Fall Risk  08/02/2018 07/13/2018 03/22/2018  Falls in the past year? No No Yes  Number falls in past yr: - - 2 or more  Injury with Fall? - - No  Risk for fall due to : - - Other (Comment)  Risk for fall due to: Comment - - impaired from alcohol     Depression Screen PHQ 2/9 Scores 12/04/2019 08/02/2018 07/13/2018 03/22/2018  PHQ - 2 Score 0 0 0 0  PHQ- 9 Score 6 - - -    No flowsheet data found.    Assessment & Plan:    Initial Preventative Physical Exam  Reviewed patient's Family Medical History Reviewed and updated list of patient's medical providers Assessment of cognitive impairment was done Assessed patient's functional ability Established a written schedule for health screening [place] Completed and Reviewed  Exercise Activities and Dietary recommendations Goals   None     Immunization History  Administered Date(s) Administered  . Influenza Split 10/12/2011  . Td 01/17/2003, 07/09/2009  . Tdap 06/12/2014    Health Maintenance  Topic Date Due  . Hepatitis C Screening  Never done  . HIV Screening  Never done  . COVID-19 Vaccine (1) Never done  . PNA vac Low Risk Adult (1 of 2 - PCV13) 02/02/2020  . INFLUENZA VACCINE   05/11/2020  . COLONOSCOPY  01/06/2022  . TETANUS/TDAP  06/12/2024     Discussed health benefits of physical activity, and encouraged him to engage in regular exercise appropriate for his age and condition.   ***     Richard Cranford Mon, MD  [facility] [phone] (phone) [phone] (fax)  [place]

## 2020-02-28 NOTE — Progress Notes (Signed)
Medicare Initial Preventative Physical Exam  I,[name],acting as a scribe for Wilhemena Durie, MD.,have documented all relevant documentation on the behalf of Wilhemena Durie, MD,as directed by  Wilhemena Durie, MD while in the presence of Wilhemena Durie, MD.       Patient: Dustin [name], Male    DOB: 1956-06-28, 64 y.o.   MRN: [id number] Visit Date: 03/04/2020  Today's Provider: Wilhemena Durie, MD   Subjective:    Chief Complaint  Patient presents with  . Medicare Wellness    Medicare Initial Preventative Physical Exam Dustin [name] is a 64 y.o. male who presents today for his Initial Preventative Physical Exam.  Welcome to Medicare exam. He has just started testosterone injections for hypogonadism. He has a history of Barrett's esophagus. He has an external hemorrhoid which bothers him but is not painful. HPI  Social History   Socioeconomic History  . Marital status: Significant Other    Spouse name: Not on file  . Number of children: 0  . Years of education: Not on file  . Highest education level: Not on file  Occupational History  . Occupation: Scientist, physiological  Tobacco Use  . Smoking status: Former Smoker    Packs/day: 1.50    Years: 40.00    Pack years: 60.00    Types: Cigarettes    Quit date: 04/25/2012    Years since quitting: 7.8  . Smokeless tobacco: Never Used  Substance and Sexual Activity  . Alcohol use: Yes    Alcohol/week: 7.0 standard drinks    Types: 7 Standard drinks or equivalent per week    Comment: occassionally  . Drug use: No  . Sexual activity: Yes    Partners: Female  Other Topics Concern  . Not on file  Social History Narrative             Social Determinants of Health   Financial Resource Strain:   . Difficulty of Paying Living Expenses:   Food Insecurity:   . Worried About Charity fundraiser in the Last Year:   . Arboriculturist in the Last Year:   Transportation Needs:   . Lexicographer (Medical):   Marland Kitchen Lack of Transportation (Non-Medical):   Physical Activity:   . Days of Exercise per Week:   . Minutes of Exercise per Session:   Stress:   . Feeling of Stress :   Social Connections:   . Frequency of Communication with Friends and Family:   . Frequency of Social Gatherings with Friends and Family:   . Attends Religious Services:   . Active Member of Clubs or Organizations:   . Attends Archivist Meetings:   Marland Kitchen Marital Status:   Intimate Partner Violence:   . Fear of Current or Ex-Partner:   . Emotionally Abused:   Marland Kitchen Physically Abused:   . Sexually Abused:     Past Medical History:  Diagnosis Date  . Arthritis   . Cerebral aneurysm   . History of colonic polyps   . Hypertension   . 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 BPH (benign prostatic hyperplasia) 08/20/2017  . B12 deficiency 08/20/2017  . Vitamin D deficiency 08/20/2017  . Venous insufficiency of both lower extremities 11/19/2016  . Venous insufficiency of left lower extremity 11/19/2016  . Edema 11/18/2016  .  History of tobacco abuse 11/18/2016  . Polyarthritis 11/18/2016  . Hemorrhoids 09/13/2016  . FHx: AAA 06/28/2016  . Actinic keratosis 12/29/2015  . Left shoulder pain 12/29/2015  . Encounter for general adult medical examination with abnormal findings 10/26/2011  . Hyperlipidemia LDL goal <100 09/22/2009  . GERD 07/12/2008  . 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 Essential hypertension, benign 06/08/2007  . CEREBRAL ANEURYSM 06/08/2007  . 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 ABDOMINAL EXPLORATION SURGERY  1978  . APPENDECTOMY     at same time as lymph node dissection  . ESOPHAGOGASTRODUODENOSCOPY  03/08    and colon  . INGUINAL HERNIA REPAIR  child   bilateral  . JOINT REPLACEMENT    . MOHS SURGERY  Left nose   at [facility]--- 2013  . prolactinoma excision  09/04   [name]  . right testicle removed  1978  . SHOULDER OPEN ROTATOR CUFF REPAIR Left 6/17   and biceps repair--[facility]  . stress nuclear  negative   10/09  . TONSILLECTOMY      His family history includes AAA (abdominal aortic aneurysm) in his father; Brain cancer in his father; Cancer in his brother, paternal aunt, and another family member; Cerebral aneurysm in his paternal grandfather; Osteoporosis in his mother.   Current Outpatient Medications:  .  buPROPion (WELLBUTRIN XL) 300 MG 24 hr tablet, Take 1 tablet by mouth once daily, Disp: 90 tablet, Rfl: 0 .  Coenzyme Q10 (COQ10) 200 MG CAPS, Take by mouth., Disp: , Rfl:  .  hydrochlorothiazide (HYDRODIURIL) 25 MG tablet, Take 1 tablet by mouth once daily, Disp: 90 tablet, Rfl: 1 .  losartan (COZAAR) 50 MG tablet, Take 1 tablet by mouth once daily, Disp: 90 tablet, Rfl: 1 .  Magnesium 125 MG CAPS, Take by mouth 2 (two) times daily., Disp: , Rfl:  .  Methylcobalamin (METHYL B-12 PO), Take 5,000 mcg by mouth., Disp: , Rfl:  .  NALTREXONE HCL PO, Take 6 mg by mouth 2 (two) times daily., Disp: , Rfl:  .  omeprazole (PRILOSEC) 40 MG capsule, Take 1 capsule (40 mg total) by mouth 2 (two) times a day. (Patient taking differently: Take 40 mg by mouth daily. ), Disp: 180 capsule, Rfl: 2 .  sildenafil (REVATIO) 20 MG tablet, Take 1 tablet (20 mg total) by mouth 3 (three) times daily., Disp: 90 tablet, Rfl: 3 .  Turmeric 500 MG CAPS, Take by mouth., Disp: , Rfl:  .  ALPRAZolam (XANAX) 0.25 MG tablet, TAKE 1 TABLET BY MOUTH TWICE A DAY AS NEEDED FOR ANXIETY (Patient not taking: Reported on 05/10/2019), Disp: 30 tablet, Rfl: 2 .  budesonide (RHINOCORT ALLERGY) 32 MCG/ACT nasal spray, Place 2 sprays into both nostrils daily. Reported on 03/31/2016, Disp: , Rfl:  .  cyanocobalamin (,VITAMIN B-12,) 1000 MCG/ML injection, Inject 1 mL (1,000 mcg total) into the muscle every 14 (fourteen) days.  (Patient not taking: Reported on 07/02/2019), Disp: 2 mL, Rfl: 11 .  Ibuprofen (ADVIL PO), Take by mouth., Disp: , Rfl:  .  methocarbamol (ROBAXIN) 500 MG tablet, Take 1 tablet (500 mg total) by mouth 2 (two) times daily., Disp: 20 tablet, Rfl: 0 .  NP THYROID PO, Take by mouth., Disp: , Rfl:  .  predniSONE (STERAPRED UNI-PAK 21 TAB) 10 MG (21) TBPK tablet, Take by mouth daily. Take 6 tabs by mouth daily  for 2 days, then 5 tabs for 2 days, then 4 tabs for  2 days, then 3 tabs for 2 days, 2 tabs for 2 days, then 1 tab by mouth daily for 2 days, Disp: 42 tablet, Rfl: 0 .  Vitamin D, Ergocalciferol, (DRISDOL) 1.25 MG (50000 UT) CAPS capsule, Take 1 capsule (50,000 Units total) by mouth every 14 (fourteen) days., Disp: 6 capsule, Rfl: 0   Patient Care Team: Jerrol Banana., MD as PCP - General (Family Medicine)  Review of Systems  Constitutional: Negative.   Eyes: Negative.   Respiratory: Negative.   Cardiovascular: Negative.   Gastrointestinal:       He has had loose stools she describes as messy in the last few years.  Endocrine: Negative.   Musculoskeletal: Positive for arthralgias, myalgias, neck pain and neck stiffness.  Allergic/Immunologic: Negative.   Neurological: Positive for light-headedness.  Psychiatric/Behavioral: Negative.   All other systems reviewed and are negative.       Objective:    Vitals: BP 138/88 (BP Location: Right Arm, Patient Position: Sitting, Cuff Size: Large)   Pulse 94   Temp (!) 96.9 F (36.1 C) (Other (Comment))   Resp 18   Ht 5\' 10"  (1.778 m)   Wt 237 lb (107.5 kg)   SpO2 95%   BMI 34.01 kg/m   Hearing Screening   125Hz  250Hz  500Hz  1000Hz  2000Hz  3000Hz  4000Hz  6000Hz  8000Hz   Right ear:           Left ear:             Visual Acuity Screening   Right eye Left eye Both eyes  Without correction: 20/25 20/25 20/20   With correction:      Physical Exam Vitals and nursing note reviewed. Exam conducted with a chaperone present.    Constitutional:      Appearance: Normal appearance. He is normal weight.  HENT:     Right Ear: Tympanic membrane normal.     Left Ear: Tympanic membrane normal.     Nose: Nose normal.     Mouth/Throat:     Mouth: Mucous membranes are moist.  Eyes:     General: No scleral icterus.    Conjunctiva/sclera: Conjunctivae normal.  Cardiovascular:     Rate and Rhythm: Normal rate and regular rhythm.     Pulses: Normal pulses.     Heart sounds: Normal heart sounds.  Pulmonary:     Effort: Pulmonary effort is normal.     Breath sounds: Normal breath sounds.  Abdominal:     General: Bowel sounds are normal.     Palpations: Abdomen is soft.  Genitourinary:    Penis: Normal.      Testes: Normal.     Comments: Right testicle surgically absent.  Left testicle seems to have a slight hydrocele. Musculoskeletal:        General: Normal range of motion.     Cervical back: Normal range of motion and neck supple.  Skin:    General: Skin is warm.  Neurological:     General: No focal deficit present.     Mental Status: He is alert and oriented to person, place, and time.  Psychiatric:        Mood and Affect: Mood normal.        Behavior: Behavior normal.      Activities of Daily Living In your present state of health, do you have any difficulty performing the following activities: 03/04/2020  Hearing? Y  Vision? N  Difficulty concentrating or making decisions? N  Walking or climbing stairs? N  Dressing  or bathing? N  Doing errands, shopping? N  Some recent data might be hidden    Fall Risk Assessment Fall Risk  03/04/2020 08/02/2018 07/13/2018 03/22/2018  Falls in the past year? 0 No No Yes  Number falls in past yr: 0 - - 2 or more  Injury with Fall? 0 - - No  Risk for fall due to : - - - Other (Comment)  Risk for fall due to: Comment - - - impaired from alcohol  Follow up Falls evaluation completed - - -     Depression Screen PHQ 2/9 Scores 03/04/2020 12/04/2019 08/02/2018  07/13/2018  PHQ - 2 Score 0 0 0 0  PHQ- 9 Score 5 6 - -    No flowsheet data found.    Assessment & Plan:    Initial Preventative Physical Exam  Reviewed patient's Family Medical History Reviewed and updated list of patient's medical providers Assessment of cognitive impairment was done Assessed patient's functional ability Established a written schedule for health screening [place] Completed and Reviewed  Exercise Activities and Dietary recommendations Goals   None     Immunization History  Administered Date(s) Administered  . Influenza Split 10/12/2011  . Moderna SARS-COVID-2 Vaccination 01/10/2020, 02/07/2020  . Td 01/17/2003, 07/09/2009  . Tdap 06/12/2014    Health Maintenance  Topic Date Due  . Hepatitis C Screening  Never done  . HIV Screening  Never done  . PNA vac Low Risk Adult (1 of 2 - PCV13) Never done  . INFLUENZA VACCINE  05/11/2020  . COLONOSCOPY  01/06/2022  . TETANUS/TDAP  06/12/2024  . COVID-19 Vaccine  Completed    Schedule GI referral for colonoscopy. Discussed health benefits of physical activity, and encouraged him to engage in regular exercise appropriate for his age and condition.   1. Welcome to Medicare preventive visit Follow-up 4 to 6 months for routine problems. - EKG 12-Lead    I, Wilhemena Durie, MD, have reviewed all documentation for this visit. The documentation on 03/08/20 for the exam, diagnosis, procedures, and orders are all accurate and complete.    Dustin Clemon Cranford Mon, MD  [facility] [phone] (phone) [phone] (fax)  [place]

## 2020-03-03 ENCOUNTER — Encounter: Payer: Self-pay | Admitting: Family Medicine

## 2020-03-04 ENCOUNTER — Other Ambulatory Visit: Payer: Self-pay

## 2020-03-04 ENCOUNTER — Ambulatory Visit (INDEPENDENT_AMBULATORY_CARE_PROVIDER_SITE_OTHER): Payer: Medicare Other | Admitting: Family Medicine

## 2020-03-04 ENCOUNTER — Encounter: Payer: Self-pay | Admitting: Family Medicine

## 2020-03-04 VITALS — BP 138/88 | HR 94 | Temp 96.9°F | Resp 18 | Ht 70.0 in | Wt 237.0 lb

## 2020-03-04 DIAGNOSIS — Z0000 Encounter for general adult medical examination without abnormal findings: Secondary | ICD-10-CM | POA: Diagnosis not present

## 2020-03-07 ENCOUNTER — Other Ambulatory Visit: Payer: Self-pay | Admitting: Internal Medicine

## 2020-03-14 ENCOUNTER — Other Ambulatory Visit: Payer: Self-pay | Admitting: Internal Medicine

## 2020-03-19 ENCOUNTER — Other Ambulatory Visit: Payer: Self-pay | Admitting: Internal Medicine

## 2020-04-18 NOTE — Progress Notes (Signed)
Established patient visit  I,Dustin [name],acting as a scribe for Dustin Durie, MD.,have documented all relevant documentation on the behalf of Dustin Durie, MD,as directed by  Dustin Durie, MD while in the presence of Dustin Durie, MD.   Patient: Dustin [name]   DOB: 1956-02-28   64 y.o. Male  MRN: [id number] Visit Date: 04/22/2020  Today's healthcare provider: Wilhemena Durie, MD   Chief Complaint  Patient presents with  . Follow-up  . Hypertension   Subjective    HPI  Patient feels fairly well.  No complaints.  Father did have abdominal aortic aneurysm.  Patient had a 60-pack-year of smoking and quit in 2013. Hypertension, follow-up  BP Readings from Last 3 Encounters:  04/22/20 (!) 144/83  03/04/20 138/88  12/04/19 (!) 152/80   Wt Readings from Last 3 Encounters:  04/22/20 237 lb (107.5 kg)  03/04/20 237 lb (107.5 kg)  12/04/19 235 lb (106.6 kg)     He was last seen for hypertension 1 months ago.  BP at that visit was 138/88. Management since that visit includes no changes.  He reports good compliance with treatment. He is not having side effects. none He is following a Regular diet. He is not exercising. He does not smoke.  Use of agents associated with hypertension: none.   Outside blood pressures are up and down. --------------------------------------------------------------------      Medications: Outpatient Medications Prior to Visit  Medication Sig  . buPROPion (WELLBUTRIN XL) 300 MG 24 hr tablet Take 1 tablet by mouth once daily  . Coenzyme Q10 (COQ10) 200 MG CAPS Take by mouth.  . cyanocobalamin (,VITAMIN B-12,) 1000 MCG/ML injection Inject 1 mL (1,000 mcg total) into the muscle every 14 (fourteen) days.  . fluconazole (DIFLUCAN) 200 MG tablet Take 200 mg by mouth once a week.  . hydrochlorothiazide (HYDRODIURIL) 25 MG tablet Take 1 tablet by mouth once daily  . Ibuprofen (ADVIL PO) Take by mouth.  . losartan  (COZAAR) 50 MG tablet Take 1 tablet by mouth once daily  . Magnesium 125 MG CAPS Take by mouth 2 (two) times daily.  . Methylcobalamin (METHYL B-12 PO) Take 5,000 mcg by mouth.  Marland Kitchen NALTREXONE HCL PO Take 6 mg by mouth 2 (two) times daily.  . NP THYROID PO Take by mouth.  Marland Kitchen omeprazole (PRILOSEC) 40 MG capsule Take 1 capsule (40 mg total) by mouth 2 (two) times a day. (Patient taking differently: Take 40 mg by mouth daily. )  . sildenafil (REVATIO) 20 MG tablet Take 1 tablet (20 mg total) by mouth 3 (three) times daily.  Marland Kitchen testosterone cypionate (DEPOTESTOSTERONE CYPIONATE) 200 MG/ML injection SMARTSIG:0.7 Milliliter(s) IM Once a Week  . Turmeric 500 MG CAPS Take by mouth.  . ALPRAZolam (XANAX) 0.25 MG tablet TAKE 1 TABLET BY MOUTH TWICE A DAY AS NEEDED FOR ANXIETY (Patient not taking: Reported on 05/10/2019)  . budesonide (RHINOCORT ALLERGY) 32 MCG/ACT nasal spray Place 2 sprays into both nostrils daily. Reported on 03/31/2016  . methocarbamol (ROBAXIN) 500 MG tablet Take 1 tablet (500 mg total) by mouth 2 (two) times daily.  . predniSONE (STERAPRED UNI-PAK 21 TAB) 10 MG (21) TBPK tablet Take by mouth daily. Take 6 tabs by mouth daily  for 2 days, then 5 tabs for 2 days, then 4 tabs for 2 days, then 3 tabs for 2 days, 2 tabs for 2 days, then 1 tab by mouth daily for 2 days  . Vitamin D,  Ergocalciferol, (DRISDOL) 1.25 MG (50000 UT) CAPS capsule Take 1 capsule (50,000 Units total) by mouth every 14 (fourteen) days.   No facility-administered medications prior to visit.    Review of Systems  Constitutional: Negative for appetite change, chills and fever.  Respiratory: Negative for chest tightness, shortness of breath and wheezing.   Cardiovascular: Negative for chest pain and palpitations.  Gastrointestinal: Negative for abdominal pain, nausea and vomiting.       Objective    BP (!) 144/83 (BP Location: Right Arm, Patient Position: Sitting, Cuff Size: Large)   Pulse 79   Temp (!) 97.3 F (36.3  C) (Other (Comment))   Resp 18   Ht 5\' 10"  (1.778 m)   Wt 237 lb (107.5 kg)   SpO2 94%   BMI 34.01 kg/m  BP Readings from Last 3 Encounters:  04/22/20 (!) 144/83  03/04/20 138/88  12/04/19 (!) 152/80   Wt Readings from Last 3 Encounters:  04/22/20 237 lb (107.5 kg)  03/04/20 237 lb (107.5 kg)  12/04/19 235 lb (106.6 kg)      Physical Exam    No results found for any visits on 04/22/20.  Assessment & Plan     1. Essential hypertension, benign Due to family history of father with abdominal aortic aneurysm and patient with history of smoking we will try to obtain screening for abdominal aortic aneurysm - Lipid panel - CBC with Differential/Platelet - Comprehensive metabolic panel - TSH  2. Hyperlipidemia LDL goal <100  - Lipid panel - CBC with Differential/Platelet - Comprehensive metabolic panel - TSH  3. History of testicular cancer  - PSA  4. Benign prostatic hyperplasia with urinary frequency  - PSA  5. Need for hepatitis C screening test  - Hepatitis C antibody  6. Encounter for screening for HIV  - HIV Antibody (routine testing w rflx)  7. Prostate cancer screening  - PSA   No follow-ups on file.      I, Dustin Durie, MD, have reviewed all documentation for this visit. The documentation on 04/25/20 for the exam, diagnosis, procedures, and orders are all accurate and complete.    Oral Remache Cranford Mon, MD  [facility] [phone] (phone) [phone] (fax)  [place]

## 2020-04-22 ENCOUNTER — Encounter: Payer: Self-pay | Admitting: Family Medicine

## 2020-04-22 ENCOUNTER — Ambulatory Visit (INDEPENDENT_AMBULATORY_CARE_PROVIDER_SITE_OTHER): Payer: Medicare Other | Admitting: Family Medicine

## 2020-04-22 ENCOUNTER — Other Ambulatory Visit: Payer: Self-pay

## 2020-04-22 VITALS — BP 144/83 | HR 79 | Temp 97.3°F | Resp 18 | Ht 70.0 in | Wt 237.0 lb

## 2020-04-22 DIAGNOSIS — Z8547 Personal history of malignant neoplasm of testis: Secondary | ICD-10-CM | POA: Diagnosis not present

## 2020-04-22 DIAGNOSIS — E785 Hyperlipidemia, unspecified: Secondary | ICD-10-CM

## 2020-04-22 DIAGNOSIS — I10 Essential (primary) hypertension: Secondary | ICD-10-CM | POA: Diagnosis not present

## 2020-04-22 DIAGNOSIS — R350 Frequency of micturition: Secondary | ICD-10-CM

## 2020-04-22 DIAGNOSIS — Z1159 Encounter for screening for other viral diseases: Secondary | ICD-10-CM

## 2020-04-22 DIAGNOSIS — Z114 Encounter for screening for human immunodeficiency virus [HIV]: Secondary | ICD-10-CM

## 2020-04-22 DIAGNOSIS — Z125 Encounter for screening for malignant neoplasm of prostate: Secondary | ICD-10-CM

## 2020-04-22 DIAGNOSIS — N401 Enlarged prostate with lower urinary tract symptoms: Secondary | ICD-10-CM | POA: Diagnosis not present

## 2020-04-23 LAB — CBC WITH DIFFERENTIAL/PLATELET
Basophils Absolute: 0 10*3/uL (ref 0.0–0.2)
Basos: 1 %
EOS (ABSOLUTE): 0.1 10*3/uL (ref 0.0–0.4)
Eos: 2 %
Hematocrit: 52.1 % — ABNORMAL HIGH (ref 37.5–51.0)
Hemoglobin: 17.8 g/dL — ABNORMAL HIGH (ref 13.0–17.7)
Immature Grans (Abs): 0 10*3/uL (ref 0.0–0.1)
Immature Granulocytes: 0 %
Lymphocytes Absolute: 1.3 10*3/uL (ref 0.7–3.1)
Lymphs: 20 %
MCH: 32.4 pg (ref 26.6–33.0)
MCHC: 34.2 g/dL (ref 31.5–35.7)
MCV: 95 fL (ref 79–97)
Monocytes Absolute: 0.4 10*3/uL (ref 0.1–0.9)
Monocytes: 7 %
Neutrophils Absolute: 4.4 10*3/uL (ref 1.4–7.0)
Neutrophils: 70 %
Platelets: 171 10*3/uL (ref 150–450)
RBC: 5.5 x10E6/uL (ref 4.14–5.80)
RDW: 12.7 % (ref 11.6–15.4)
WBC: 6.3 10*3/uL (ref 3.4–10.8)

## 2020-04-23 LAB — HEPATITIS C ANTIBODY: Hep C Virus Ab: 0.1 s/co ratio (ref 0.0–0.9)

## 2020-04-23 LAB — LIPID PANEL
Chol/HDL Ratio: 5.6 ratio — ABNORMAL HIGH (ref 0.0–5.0)
Cholesterol, Total: 201 mg/dL — ABNORMAL HIGH (ref 100–199)
HDL: 36 mg/dL — ABNORMAL LOW (ref >39)
LDL Chol Calc (NIH): 130 mg/dL — ABNORMAL HIGH (ref 0–99)
Triglycerides: 196 mg/dL — ABNORMAL HIGH (ref 0–149)
VLDL Cholesterol Cal: 35 mg/dL (ref 5–40)

## 2020-04-23 LAB — COMPREHENSIVE METABOLIC PANEL
ALT: 22 IU/L (ref 0–44)
AST: 19 IU/L (ref 0–40)
Albumin/Globulin Ratio: 1.8 (ref 1.2–2.2)
Albumin: 4.3 g/dL (ref 3.8–4.8)
Alkaline Phosphatase: 85 IU/L (ref 48–121)
BUN/Creatinine Ratio: 9 — ABNORMAL LOW (ref 10–24)
BUN: 12 mg/dL (ref 8–27)
Bilirubin Total: 0.4 mg/dL (ref 0.0–1.2)
CO2: 27 mmol/L (ref 20–29)
Calcium: 9.7 mg/dL (ref 8.6–10.2)
Chloride: 99 mmol/L (ref 96–106)
Creatinine, Ser: 1.4 mg/dL — ABNORMAL HIGH (ref 0.76–1.27)
GFR calc Af Amer: 61 mL/min/{1.73_m2} (ref >59)
GFR calc non Af Amer: 52 mL/min/{1.73_m2} — ABNORMAL LOW (ref >59)
Globulin, Total: 2.4 g/dL (ref 1.5–4.5)
Glucose: 93 mg/dL (ref 65–99)
Potassium: 4.1 mmol/L (ref 3.5–5.2)
Sodium: 138 mmol/L (ref 134–144)
Total Protein: 6.7 g/dL (ref 6.0–8.5)

## 2020-04-23 LAB — HIV ANTIBODY (ROUTINE TESTING W REFLEX): HIV Screen 4th Generation wRfx: NONREACTIVE

## 2020-04-23 LAB — TSH: TSH: 1.41 u[IU]/mL (ref 0.450–4.500)

## 2020-04-23 LAB — PSA: Prostate Specific Ag, Serum: 2.1 ng/mL (ref 0.0–4.0)

## 2020-04-25 ENCOUNTER — Telehealth: Payer: Self-pay

## 2020-04-25 NOTE — Telephone Encounter (Signed)
Patient advised of labs through mychart and has read the providers comments. 

## 2020-04-25 NOTE — Telephone Encounter (Signed)
-----   Message from Jerrol Banana., MD sent at 04/23/2020  4:17 PM EDT ----- Labs stable.  Push fluids.

## 2020-04-27 ENCOUNTER — Other Ambulatory Visit: Payer: Self-pay | Admitting: Internal Medicine

## 2020-05-04 ENCOUNTER — Other Ambulatory Visit: Payer: Self-pay | Admitting: Internal Medicine

## 2020-05-19 ENCOUNTER — Other Ambulatory Visit: Payer: Self-pay | Admitting: Family Medicine

## 2020-05-19 NOTE — Telephone Encounter (Signed)
Requested medication (s) are due for refill today:yes  Requested medication (s) are on the active medication list: yes  Last refill:01/02/2018  #30  2 refills  Future visit scheduled no  Notes to clinic: Prescription expired and historical provider. Last OV with Dr. Rosanna Randy 04/22/20. Med is on current med profile.  Requested Prescriptions  Pending Prescriptions Disp Refills   ALPRAZolam (XANAX) 0.25 MG tablet 30 tablet 2      Not Delegated - Psychiatry:  Anxiolytics/Hypnotics Failed - 05/19/2020  2:47 PM      Failed - This refill cannot be delegated      Failed - Urine Drug Screen completed in last 360 days.      Passed - Valid encounter within last 6 months    Recent Outpatient Visits           3 weeks ago Essential hypertension, benign   [facility] Jerrol Banana., MD   2 months ago [facility] to [facility] preventive visit   [facility] Jerrol Banana., MD   5 months ago Essential hypertension, benign   [facility] Jerrol Banana., MD

## 2020-05-19 NOTE — Telephone Encounter (Signed)
Medication Refill - Medication: ALPRAZolam (XANAX) 0.25 MG tablet   Patient stated he is leaving on Wednesday morning to go out of town   Preferred Pharmacy (with phone number or street name):  [place], [place] Phone:  [phone]  Fax:  [phone]       Agent: Please be advised that RX refills may take up to 3 business days. We ask that you follow-up with your pharmacy.

## 2020-05-20 MED ORDER — ALPRAZOLAM 0.25 MG PO TABS: ORAL_TABLET | ORAL | 2 refills | Status: DC

## 2020-06-08 ENCOUNTER — Other Ambulatory Visit: Payer: Self-pay | Admitting: Internal Medicine

## 2020-06-24 ENCOUNTER — Other Ambulatory Visit: Payer: Self-pay | Admitting: Internal Medicine

## 2020-06-27 ENCOUNTER — Other Ambulatory Visit: Payer: Self-pay | Admitting: Internal Medicine

## 2020-07-04 ENCOUNTER — Other Ambulatory Visit: Payer: Self-pay

## 2020-07-04 NOTE — Telephone Encounter (Signed)
Patient sent a message saying he is having trouble getting his medications refilled. It looks like they are sending it to Dr Derrel Nip instead of here.    He is out of Bupropion and it looks like there has been a request for Xanax and Losartan as well.  Can he get these senst in please?

## 2020-07-05 ENCOUNTER — Other Ambulatory Visit: Payer: Self-pay

## 2020-07-05 MED ORDER — BUPROPION HCL ER (XL) 300 MG PO TB24
300.0000 mg | ORAL_TABLET | Freq: Every day | ORAL | 3 refills | Status: DC
Start: 2020-07-05 — End: 2020-10-15

## 2020-07-07 MED ORDER — BUPROPION HCL ER (XL) 300 MG PO TB24: 300.0000 mg | ORAL_TABLET | Freq: Every day | ORAL | 3 refills | Status: DC

## 2020-07-07 MED ORDER — ALPRAZOLAM 0.25 MG PO TABS: ORAL_TABLET | ORAL | 3 refills | Status: DC

## 2020-07-07 MED ORDER — LOSARTAN POTASSIUM 50 MG PO TABS: 50.0000 mg | ORAL_TABLET | Freq: Every day | ORAL | 3 refills | Status: DC

## 2020-07-11 DIAGNOSIS — U071 COVID-19: Secondary | ICD-10-CM | POA: Insufficient documentation

## 2020-07-11 DIAGNOSIS — Z8616 Personal history of COVID-19: Secondary | ICD-10-CM

## 2020-07-11 HISTORY — DX: Personal history of COVID-19: Z86.16

## 2020-07-11 HISTORY — DX: COVID-19: U07.1

## 2020-07-28 ENCOUNTER — Telehealth: Payer: Self-pay

## 2020-07-28 ENCOUNTER — Telehealth (INDEPENDENT_AMBULATORY_CARE_PROVIDER_SITE_OTHER): Payer: Medicare Other | Admitting: Family Medicine

## 2020-07-28 DIAGNOSIS — R059 Cough, unspecified: Secondary | ICD-10-CM | POA: Diagnosis not present

## 2020-07-28 DIAGNOSIS — U071 COVID-19: Secondary | ICD-10-CM

## 2020-07-28 NOTE — Progress Notes (Signed)
MyChart Video Visit    Virtual Visit via Video Note   This visit type was conducted due to national recommendations for restrictions regarding the COVID-19 Pandemic (e.g. social distancing) in an effort to limit this patient's exposure and mitigate transmission in our community. This patient is at least at moderate risk for complications without adequate follow up. This format is felt to be most appropriate for this patient at this time. Physical exam was limited by quality of the video and audio technology used for the visit.   Patient location: home Provider location: office  I discussed the limitations of evaluation and management by telemedicine and the availability of in person appointments. The patient expressed understanding and agreed to proceed.  Patient: Dustin [name]   DOB: 13-Jan-1956   64 y.o. Male  MRN: [id number] Visit Date: 07/28/2020  Today's healthcare provider: Wilhemena Durie, MD   No chief complaint on file.  Subjective    HPI  Patient had been traveling to [place] a week ago and then was in [place] last week when he started feeling poorly.  Developed cough and had chills and has had some left-sided chest pain with his coughing.  He had a positive home Covid test last night and his partner tested negative.  He is vaccinated for Covid.  He wishes to discuss possible treatment options and also possible infusion therapy.     Medications: Outpatient Medications Prior to Visit  Medication Sig  . ALPRAZolam (XANAX) 0.25 MG tablet TAKE 1 TABLET BY MOUTH TWICE A DAY AS NEEDED FOR ANXIETY  . budesonide (RHINOCORT ALLERGY) 32 MCG/ACT nasal spray Place 2 sprays into both nostrils daily. Reported on 03/31/2016  . buPROPion (WELLBUTRIN XL) 300 MG 24 hr tablet Take 1 tablet (300 mg total) by mouth daily.  Marland Kitchen buPROPion (WELLBUTRIN XL) 300 MG 24 hr tablet Take 1 tablet (300 mg total) by mouth daily.  . Coenzyme Q10 (COQ10) 200 MG CAPS Take by mouth.  .  cyanocobalamin (,VITAMIN B-12,) 1000 MCG/ML injection Inject 1 mL (1,000 mcg total) into the muscle every 14 (fourteen) days.  . fluconazole (DIFLUCAN) 200 MG tablet Take 200 mg by mouth once a week.  . hydrochlorothiazide (HYDRODIURIL) 25 MG tablet Take 1 tablet by mouth once daily  . Ibuprofen (ADVIL PO) Take by mouth.  . losartan (COZAAR) 50 MG tablet Take 1 tablet (50 mg total) by mouth daily.  . Magnesium 125 MG CAPS Take by mouth 2 (two) times daily.  . methocarbamol (ROBAXIN) 500 MG tablet Take 1 tablet (500 mg total) by mouth 2 (two) times daily.  . Methylcobalamin (METHYL B-12 PO) Take 5,000 mcg by mouth.  Marland Kitchen NALTREXONE HCL PO Take 6 mg by mouth 2 (two) times daily.  . NP THYROID PO Take by mouth.  Marland Kitchen omeprazole (PRILOSEC) 40 MG capsule Take 1 capsule (40 mg total) by mouth 2 (two) times a day. (Patient taking differently: Take 40 mg by mouth daily. )  . predniSONE (STERAPRED UNI-PAK 21 TAB) 10 MG (21) TBPK tablet Take by mouth daily. Take 6 tabs by mouth daily  for 2 days, then 5 tabs for 2 days, then 4 tabs for 2 days, then 3 tabs for 2 days, 2 tabs for 2 days, then 1 tab by mouth daily for 2 days  . sildenafil (REVATIO) 20 MG tablet Take 1 tablet (20 mg total) by mouth 3 (three) times daily.  Marland Kitchen testosterone cypionate (DEPOTESTOSTERONE CYPIONATE) 200 MG/ML injection SMARTSIG:0.7 Milliliter(s) IM Once  a Week  . Turmeric 500 MG CAPS Take by mouth.  . Vitamin D, Ergocalciferol, (DRISDOL) 1.25 MG (50000 UT) CAPS capsule Take 1 capsule (50,000 Units total) by mouth every 14 (fourteen) days.   No facility-administered medications prior to visit.    Review of Systems     Objective    There were no vitals taken for this visit.    Physical Exam  He is speaking in full sentences with no shortness of breath or all obvious wheezing.  He is alert and oriented.  He answers all questions appropriately.   Assessment & Plan     1. COVID-19 virus infection Patient is vaccinated.  Due to  court comorbidities will refer to infusion center. I recommended vitamin D vitamin C and zinc daily along with Mucinex twice a day.  He is taking Tylenol for the body aches.  He is not short of breath.  2. Cough Mucinex twice a day.  If he gets worse we will consider doxycycline, Z-Pak, prednisone.   No follow-ups on file.     I discussed the assessment and treatment plan with the patient. The patient was provided an opportunity to ask questions and all were answered. The patient agreed with the plan and demonstrated an understanding of the instructions.   The patient was advised to call back or seek an in-person evaluation if the symptoms worsen or if the condition fails to improve as anticipated.  I provided 15 minutes of non-face-to-face time during this encounter.    Dharma Pare Cranford Mon, MD [facility] [phone] (phone) [phone] (fax)  [place]

## 2020-07-28 NOTE — Telephone Encounter (Signed)
Noted  

## 2020-07-28 NOTE — Telephone Encounter (Signed)
Copied from [place] [id number]. Topic: General - Inquiry >> Jul 28, 2020  9:16 AM Greggory Keen D wrote: Reason for CRM: Pt call saying he tested positive for Covid last night with a home test.  He has had his vaccines.  He does have a cough, coughing up some congestion.  He said his brother is a doctor and he suggested that he go on antibiotic and prednisone and possible ivermectin and infusion.  He would like a nurse to call him back.  CB#  [phone]

## 2020-07-28 NOTE — Telephone Encounter (Signed)
I went ahead and made an appt for 2:20 virtual

## 2020-07-29 ENCOUNTER — Ambulatory Visit (HOSPITAL_COMMUNITY)
Admission: RE | Admit: 2020-07-29 | Discharge: 2020-07-29 | Disposition: A | Discharge status: Home / Self Care | Payer: Medicare Other | Source: Ambulatory Visit | Attending: Pulmonary Disease | Admitting: Pulmonary Disease

## 2020-07-29 ENCOUNTER — Telehealth: Payer: Self-pay | Admitting: Family Medicine

## 2020-07-29 ENCOUNTER — Telehealth: Payer: Self-pay | Admitting: Nurse Practitioner

## 2020-07-29 ENCOUNTER — Other Ambulatory Visit: Payer: Self-pay | Admitting: Nurse Practitioner

## 2020-07-29 ENCOUNTER — Encounter: Payer: Self-pay | Admitting: Nurse Practitioner

## 2020-07-29 DIAGNOSIS — U071 COVID-19: Secondary | ICD-10-CM | POA: Diagnosis present

## 2020-07-29 DIAGNOSIS — I10 Essential (primary) hypertension: Secondary | ICD-10-CM | POA: Insufficient documentation

## 2020-07-29 DIAGNOSIS — Z23 Encounter for immunization: Secondary | ICD-10-CM | POA: Diagnosis not present

## 2020-07-29 MED ORDER — METHYLPREDNISOLONE SODIUM SUCC 125 MG IJ SOLR: 125.0000 mg | Freq: Once | INTRAMUSCULAR | Status: DC | PRN

## 2020-07-29 MED ORDER — ALBUTEROL SULFATE HFA 108 (90 BASE) MCG/ACT IN AERS: 2.0000 | INHALATION_SPRAY | Freq: Once | RESPIRATORY_TRACT | Status: DC | PRN

## 2020-07-29 MED ORDER — SODIUM CHLORIDE 0.9 % IV SOLN: INTRAVENOUS | Status: DC | PRN

## 2020-07-29 MED ORDER — DIPHENHYDRAMINE HCL 50 MG/ML IJ SOLN: 50.0000 mg | Freq: Once | INTRAMUSCULAR | Status: DC | PRN

## 2020-07-29 MED ORDER — EPINEPHRINE 0.3 MG/0.3ML IJ SOAJ: 0.3000 mg | Freq: Once | INTRAMUSCULAR | Status: DC | PRN

## 2020-07-29 MED ORDER — FAMOTIDINE IN NACL 20-0.9 MG/50ML-% IV SOLN: 20.0000 mg | Freq: Once | INTRAVENOUS | Status: DC | PRN

## 2020-07-29 MED ORDER — SODIUM CHLORIDE 0.9 % IV SOLN: Freq: Once | INTRAVENOUS | Status: AC

## 2020-07-29 NOTE — Telephone Encounter (Signed)
Pt hasn't heard from infusion clinic this am and wondering if order was sent to them?  Pt had virtual with Dr Rosanna Randy yesterday and was suppose to hear from clinic this am.

## 2020-07-29 NOTE — Discharge Instructions (Signed)
COVID-19 COVID-19 is a respiratory infection that is caused by a virus called severe acute respiratory syndrome coronavirus 2 (SARS-CoV-2). The disease is also known as coronavirus disease or novel coronavirus. In some people, the virus may not cause any symptoms. In others, it may cause a serious infection. The infection can get worse quickly and can lead to complications, such as:  Pneumonia, or infection of the lungs.  Acute respiratory distress syndrome or ARDS. This is a condition in which fluid build-up in the lungs prevents the lungs from filling with air and passing oxygen into the blood.  Acute respiratory failure. This is a condition in which there is not enough oxygen passing from the lungs to the body or when carbon dioxide is not passing from the lungs out of the body.  Sepsis or septic shock. This is a serious bodily reaction to an infection.  Blood clotting problems.  Secondary infections due to bacteria or fungus.  Organ failure. This is when your body's organs stop working. The virus that causes COVID-19 is contagious. This means that it can spread from person to person through droplets from coughs and sneezes (respiratory secretions). What are the causes? This illness is caused by a virus. You may catch the virus by:  Breathing in droplets from an infected person. Droplets can be spread by a person breathing, speaking, singing, coughing, or sneezing.  Touching something, like a table or a doorknob, that was exposed to the virus (contaminated) and then touching your mouth, nose, or eyes. What increases the risk? Risk for infection You are more likely to be infected with this virus if you:  Are within 6 feet (2 meters) of a person with COVID-19.  Provide care for or live with a person who is infected with COVID-19.  Spend time in crowded indoor spaces or live in shared housing. Risk for serious illness You are more likely to become seriously ill from the virus if  you:  Are 50 years of age or older. The higher your age, the more you are at risk for serious illness.  Live in a nursing home or long-term care facility.  Have cancer.  Have a long-term (chronic) disease such as: ? Chronic lung disease, including chronic obstructive pulmonary disease or asthma. ? A long-term disease that lowers your body's ability to fight infection (immunocompromised). ? Heart disease, including heart failure, a condition in which the arteries that lead to the heart become narrow or blocked (coronary artery disease), a disease which makes the heart muscle thick, weak, or stiff (cardiomyopathy). ? Diabetes. ? Chronic kidney disease. ? Sickle cell disease, a condition in which red blood cells have an abnormal "sickle" shape. ? Liver disease.  Are obese. What are the signs or symptoms? Symptoms of this condition can range from mild to severe. Symptoms may appear any time from 2 to 14 days after being exposed to the virus. They include:  A fever or chills.  A cough.  Difficulty breathing.  Headaches, body aches, or muscle aches.  Runny or stuffy (congested) nose.  A sore throat.  New loss of taste or smell. Some people may also have stomach problems, such as nausea, vomiting, or diarrhea. Other people may not have any symptoms of COVID-19. How is this diagnosed? This condition may be diagnosed based on:  Your signs and symptoms, especially if: ? You live in an area with a COVID-19 outbreak. ? You recently traveled to or from an area where the virus is common. ? You   provide care for or live with a person who was diagnosed with COVID-19. ? You were exposed to a person who was diagnosed with COVID-19.  A physical exam.  Lab tests, which may include: ? Taking a sample of fluid from the back of your nose and throat (nasopharyngeal fluid), your nose, or your throat using a swab. ? A sample of mucus from your lungs (sputum). ? Blood tests.  Imaging tests,  which may include, X-rays, CT scan, or ultrasound. How is this treated? At present, there is no medicine to treat COVID-19. Medicines that treat other diseases are being used on a trial basis to see if they are effective against COVID-19. Your health care provider will talk with you about ways to treat your symptoms. For most people, the infection is mild and can be managed at home with rest, fluids, and over-the-counter medicines. Treatment for a serious infection usually takes places in a hospital intensive care unit (ICU). It may include one or more of the following treatments. These treatments are given until your symptoms improve.  Receiving fluids and medicines through an IV.  Supplemental oxygen. Extra oxygen is given through a tube in the nose, a face mask, or a hood.  Positioning you to lie on your stomach (prone position). This makes it easier for oxygen to get into the lungs.  Continuous positive airway pressure (CPAP) or bi-level positive airway pressure (BPAP) machine. This treatment uses mild air pressure to keep the airways open. A tube that is connected to a motor delivers oxygen to the body.  Ventilator. This treatment moves air into and out of the lungs by using a tube that is placed in your windpipe.  Tracheostomy. This is a procedure to create a hole in the neck so that a breathing tube can be inserted.  Extracorporeal membrane oxygenation (ECMO). This procedure gives the lungs a chance to recover by taking over the functions of the heart and lungs. It supplies oxygen to the body and removes carbon dioxide. Follow these instructions at home: Lifestyle  If you are sick, stay home except to get medical care. Your health care provider will tell you how long to stay home. Call your health care provider before you go for medical care.  Rest at home as told by your health care provider.  Do not use any products that contain nicotine or tobacco, such as cigarettes,  e-cigarettes, and chewing tobacco. If you need help quitting, ask your health care provider.  Return to your normal activities as told by your health care provider. Ask your health care provider what activities are safe for you. General instructions  Take over-the-counter and prescription medicines only as told by your health care provider.  Drink enough fluid to keep your urine pale yellow.  Keep all follow-up visits as told by your health care provider. This is important. How is this prevented?  There is no vaccine to help prevent COVID-19 infection. However, there are steps you can take to protect yourself and others from this virus. To protect yourself:   Do not travel to areas where COVID-19 is a risk. The areas where COVID-19 is reported change often. To identify high-risk areas and travel restrictions, check the CDC travel website: wwwnc.cdc.gov/travel/notices  If you live in, or must travel to, an area where COVID-19 is a risk, take precautions to avoid infection. ? Stay away from people who are sick. ? Wash your hands often with soap and water for 20 seconds. If soap and water   are not available, use an alcohol-based hand sanitizer. ? Avoid touching your mouth, face, eyes, or nose. ? Avoid going out in public, follow guidance from your state and local health authorities. ? If you must go out in public, wear a cloth face covering or face mask. Make sure your mask covers your nose and mouth. ? Avoid crowded indoor spaces. Stay at least 6 feet (2 meters) away from others. ? Disinfect objects and surfaces that are frequently touched every day. This may include:  Counters and tables.  Doorknobs and light switches.  Sinks and faucets.  Electronics, such as phones, remote controls, keyboards, computers, and tablets. To protect others: If you have symptoms of COVID-19, take steps to prevent the virus from spreading to others.  If you think you have a COVID-19 infection, contact  your health care provider right away. Tell your health care team that you think you may have a COVID-19 infection.  Stay home. Leave your house only to seek medical care. Do not use public transport.  Do not travel while you are sick.  Wash your hands often with soap and water for 20 seconds. If soap and water are not available, use alcohol-based hand sanitizer.  Stay away from other members of your household. Let healthy household members care for children and pets, if possible. If you have to care for children or pets, wash your hands often and wear a mask. If possible, stay in your own room, separate from others. Use a different bathroom.  Make sure that all people in your household wash their hands well and often.  Cough or sneeze into a tissue or your sleeve or elbow. Do not cough or sneeze into your hand or into the air.  Wear a cloth face covering or face mask. Make sure your mask covers your nose and mouth. Where to find more information  Centers for Disease Control and Prevention: www.cdc.gov/coronavirus/2019-ncov/index.html  World Health Organization: www.who.int/health-topics/coronavirus Contact a health care provider if:  You live in or have traveled to an area where COVID-19 is a risk and you have symptoms of the infection.  You have had contact with someone who has COVID-19 and you have symptoms of the infection. Get help right away if:  You have trouble breathing.  You have pain or pressure in your chest.  You have confusion.  You have bluish lips and fingernails.  You have difficulty waking from sleep.  You have symptoms that get worse. These symptoms may represent a serious problem that is an emergency. Do not wait to see if the symptoms will go away. Get medical help right away. Call your local emergency services (911 in the [place] Do not drive yourself to the hospital. Let the emergency medical personnel know if you think you have  COVID-19. Summary  COVID-19 is a respiratory infection that is caused by a virus. It is also known as coronavirus disease or novel coronavirus. It can cause serious infections, such as pneumonia, acute respiratory distress syndrome, acute respiratory failure, or sepsis.  The virus that causes COVID-19 is contagious. This means that it can spread from person to person through droplets from breathing, speaking, singing, coughing, or sneezing.  You are more likely to develop a serious illness if you are 50 years of age or older, have a weak immune system, live in a nursing home, or have chronic disease.  There is no medicine to treat COVID-19. Your health care provider will talk with you about ways to treat your symptoms.    Take steps to protect yourself and others from infection. Wash your hands often and disinfect objects and surfaces that are frequently touched every day. Stay away from people who are sick and wear a mask if you are sick. This information is not intended to replace advice given to you by your health care provider. Make sure you discuss any questions you have with your health care provider. Document Revised: 07/27/2019 Document Reviewed: 11/02/2018 Elsevier Patient Education  2020 Elsevier Inc. What types of side effects do monoclonal antibody drugs cause?  Common side effects  In general, the more common side effects caused by monoclonal antibody drugs include: . Allergic reactions, such as hives or itching . Flu-like signs and symptoms, including chills, fatigue, fever, and muscle aches and pains . Nausea, vomiting . Diarrhea . Skin rashes . Low blood pressure   The CDC is recommending patients who receive monoclonal antibody treatments wait at least 90 days before being vaccinated.  Currently, there are no data on the safety and efficacy of mRNA COVID-19 vaccines in persons who received monoclonal antibodies or convalescent plasma as part of COVID-19 treatment. Based  on the estimated half-life of such therapies as well as evidence suggesting that reinfection is uncommon in the 90 days after initial infection, vaccination should be deferred for at least 90 days, as a precautionary measure until additional information becomes available, to avoid interference of the antibody treatment with vaccine-induced immune responses. 

## 2020-07-29 NOTE — Progress Notes (Signed)
I connected by phone with Dustin [name] on 07/29/2020 at 12:04 PM to discuss the potential use of a new treatment for mild to moderate COVID-19 viral infection in non-hospitalized patients.  This patient is a 64 y.o. male that meets the FDA criteria for Emergency Use Authorization of COVID monoclonal antibody casirivimab/imdevimab.  Has a (+) direct SARS-CoV-2 viral test result  Has mild or moderate COVID-19   Is NOT hospitalized due to COVID-19  Is within 10 days of symptom onset  Has at least one of the high risk factor(s) for progression to severe COVID-19 and/or hospitalization as defined in EUA.  Specific high risk criteria : Older age (>/= 66 yo); BMI; HTN   I have spoken and communicated the following to the patient or parent/caregiver regarding COVID monoclonal antibody treatment:  1. FDA has authorized the emergency use for the treatment of mild to moderate COVID-19 in adults and pediatric patients with positive results of direct SARS-CoV-2 viral testing who are 24 years of age and older weighing at least 40 kg, and who are at high risk for progressing to severe COVID-19 and/or hospitalization.  2. The significant known and potential risks and benefits of COVID monoclonal antibody, and the extent to which such potential risks and benefits are unknown.  3. Information on available alternative treatments and the risks and benefits of those alternatives, including clinical trials.  4. Patients treated with COVID monoclonal antibody should continue to self-isolate and use infection control measures (e.g., wear mask, isolate, social distance, avoid sharing personal items, clean and disinfect "high touch" surfaces, and frequent handwashing) according to CDC guidelines.   5. The patient or parent/caregiver has the option to accept or refuse COVID monoclonal antibody treatment.  After reviewing this information with the patient, the patient has agreed to receive one of the available  covid 19 monoclonal antibodies and will be provided an appropriate fact sheet prior to infusion.  Murray Hodgkins, NP 07/29/2020 12:04 PM

## 2020-07-29 NOTE — Telephone Encounter (Signed)
Patient was contacted bu [facility].

## 2020-07-29 NOTE — Telephone Encounter (Signed)
He is on the list for [facility]

## 2020-07-29 NOTE — Progress Notes (Signed)
  Diagnosis: COVID-19  Physician: Dr. Joya Gaskins  Procedure: [facility] Med: bamlanivimab\etesevimab infusion - Provided patient with bamlanimivab\etesevimab fact sheet for patients, parents and caregivers prior to infusion.  Complications: No immediate complications noted.  Discharge: Discharged home   Rejeana Brock 07/29/2020

## 2020-07-29 NOTE — Telephone Encounter (Signed)
Called patient to discuss Covid symptoms and the use of casirivimab/imdevimab, a monoclonal antibody infusion for those with mild to moderate Covid symptoms and at a high risk of hospitalization.  Pt is qualified for this infusion at the [facility] due to; Specific high risk criteria : Older age (>/= 65 yo); BMI; HTN   Message left to call back our hotline [phone].  Murray Hodgkins, NP

## 2020-07-30 ENCOUNTER — Other Ambulatory Visit: Payer: Self-pay

## 2020-07-30 ENCOUNTER — Ambulatory Visit
Admission: RE | Admit: 2020-07-30 | Discharge: 2020-07-30 | Disposition: A | Discharge status: Home / Self Care | Payer: Medicare Other | Source: Ambulatory Visit | Attending: Family Medicine | Admitting: Family Medicine

## 2020-07-30 ENCOUNTER — Telehealth: Payer: Self-pay

## 2020-07-30 ENCOUNTER — Other Ambulatory Visit (HOSPITAL_COMMUNITY): Payer: Self-pay

## 2020-07-30 ENCOUNTER — Ambulatory Visit
Admission: RE | Admit: 2020-07-30 | Discharge: 2020-07-30 | Disposition: A | Discharge status: Home / Self Care | Payer: Medicare Other | Attending: Family Medicine | Admitting: Family Medicine

## 2020-07-30 DIAGNOSIS — R059 Cough, unspecified: Secondary | ICD-10-CM | POA: Diagnosis present

## 2020-07-30 DIAGNOSIS — U071 COVID-19: Secondary | ICD-10-CM

## 2020-07-30 MED ORDER — PREDNISONE 10 MG (21) PO TBPK
ORAL_TABLET | Freq: Every day | ORAL | 0 refills | Status: DC
Start: 2020-07-30 — End: 2020-10-15

## 2020-07-30 MED ORDER — AZITHROMYCIN 250 MG PO TABS: ORAL_TABLET | ORAL | 0 refills | Status: DC

## 2020-07-30 NOTE — Telephone Encounter (Signed)
Prednisone 10mg  6 day taper--#21

## 2020-07-30 NOTE — Telephone Encounter (Signed)
Patient's wife advised. She is requesting a steroid also.

## 2020-07-30 NOTE — Telephone Encounter (Signed)
Copied from [place] [id number]. Topic: General - Other >> Jul 30, 2020 10:49 AM Rainey Pines A wrote: Patients girlfriend called back and wants to speak with Dr .Marlan Palau nurse in regards to patient getting xray. Please advise

## 2020-07-30 NOTE — Telephone Encounter (Signed)
CXR and Zpak ok

## 2020-07-30 NOTE — Telephone Encounter (Signed)
Patient is requesting a chest x-ray due to past history of pneumonia. Patient is coughing up thick stuff. Patient also is requesting a steroid or a zpak. Please advise?

## 2020-08-16 ENCOUNTER — Other Ambulatory Visit: Payer: Self-pay | Admitting: Internal Medicine

## 2020-09-09 ENCOUNTER — Other Ambulatory Visit: Payer: Self-pay | Admitting: Internal Medicine

## 2020-09-21 ENCOUNTER — Other Ambulatory Visit: Payer: Self-pay | Admitting: Internal Medicine

## 2020-10-15 ENCOUNTER — Encounter: Payer: Self-pay | Admitting: Family Medicine

## 2020-10-15 ENCOUNTER — Other Ambulatory Visit: Payer: Self-pay

## 2020-10-15 ENCOUNTER — Ambulatory Visit (INDEPENDENT_AMBULATORY_CARE_PROVIDER_SITE_OTHER): Payer: Medicare Other | Admitting: Family Medicine

## 2020-10-15 VITALS — BP 138/86 | HR 77 | Temp 98.3°F | Resp 16 | Ht 70.0 in | Wt 241.0 lb

## 2020-10-15 DIAGNOSIS — I10 Essential (primary) hypertension: Secondary | ICD-10-CM | POA: Diagnosis not present

## 2020-10-15 DIAGNOSIS — M25551 Pain in right hip: Secondary | ICD-10-CM | POA: Diagnosis not present

## 2020-10-15 DIAGNOSIS — Z96641 Presence of right artificial hip joint: Secondary | ICD-10-CM | POA: Diagnosis not present

## 2020-10-15 MED ORDER — LOSARTAN POTASSIUM 100 MG PO TABS
100.0000 mg | ORAL_TABLET | Freq: Every day | ORAL | 4 refills | Status: DC
Start: 2020-10-15 — End: 2020-10-16

## 2020-10-15 NOTE — Patient Instructions (Signed)

## 2020-10-15 NOTE — Progress Notes (Signed)
Established patient visit   Patient: Dustin [name]   DOB: 1956-09-25   65 y.o. Male  MRN: [id number] Visit Date: 10/15/2020  Today's healthcare provider: Wilhemena Durie, MD   Chief Complaint  Patient presents with  . Hypertension   Subjective    HPI  Patient feels fairly well.  He continues to have pain that he has had since his hip replacement which occurred right before the [place] pandemic started so he was unable to finish physical therapy. Hypertension, follow-up  BP Readings from Last 3 Encounters:  10/15/20 138/86  07/29/20 (!) 148/86  04/22/20 (!) 144/83   Wt Readings from Last 3 Encounters:  10/15/20 241 lb (109.3 kg)  04/22/20 237 lb (107.5 kg)  03/04/20 237 lb (107.5 kg)     He was last seen for hypertension 5 months ago.  BP at that visit was 144/83. Management since that visit includes no changes. Patient reports going to Urgent Care in December due elevated BP 170/106. Patient reports Losartan was increased to 100mg  daily. Patient reports he has been without Losartan for about three days.   He reports excellent compliance with treatment. He is not having side effects.  He is following a Regular diet. He is not exercising. He does not smoke.  Use of agents associated with hypertension: none.   Outside blood pressures are elevated. Symptoms: No chest pain No chest pressure  No palpitations No syncope  No dyspnea No orthopnea  No paroxysmal nocturnal dyspnea No lower extremity edema   Pertinent labs: Lab Results  Component Value Date   CHOL 201 (H) 04/22/2020   HDL 36 (L) 04/22/2020   LDLCALC 130 (H) 04/22/2020   LDLDIRECT 129.0 05/10/2019   TRIG 196 (H) 04/22/2020   CHOLHDL 5.6 (H) 04/22/2020   Lab Results  Component Value Date   NA 138 04/22/2020   K 4.1 04/22/2020   CREATININE 1.40 (H) 04/22/2020   GFRNONAA 52 (L) 04/22/2020   GFRAA 61 04/22/2020   GLUCOSE 93 04/22/2020     The 10-year ASCVD risk score Mikey Bussing DC Jr., et al.,  2013) is: 20.1%   ---------------------------------------------------------------------------------------------------   Patient Active Problem List   Diagnosis Date Noted  . Hypertension   . COVID-19 virus infection 07/2020  . BPH (benign prostatic hyperplasia) 08/20/2017  . B12 deficiency 08/20/2017  . Vitamin D deficiency 08/20/2017  . Venous insufficiency of both lower extremities 11/19/2016  . Venous insufficiency of left lower extremity 11/19/2016  . Edema 11/18/2016  . History of tobacco abuse 11/18/2016  . Polyarthritis 11/18/2016  . Hemorrhoids 09/13/2016  . FHx: AAA 06/28/2016  . Actinic keratosis 12/29/2015  . Left shoulder pain 12/29/2015  . Encounter for general adult medical examination with abnormal findings 10/26/2011  . Hyperlipidemia LDL goal <100 09/22/2009  . GERD 07/12/2008  . 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 Essential hypertension, benign 06/08/2007  . CEREBRAL ANEURYSM 06/08/2007  . 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 Smoking status: Former Smoker    Packs/day: 1.50    Years: 40.00    Pack years: 60.00    Types: Cigarettes    Quit date: 04/25/2012    Years since quitting: 8.4  . Smokeless tobacco: Never Used  Substance Use Topics  . Alcohol use: Yes    Alcohol/week: 7.0 standard drinks    Types: 7 Standard drinks or equivalent per week    Comment: occassionally  . Drug use: No  Allergies  Allergen Reactions  . Amlodipine Swelling    LE EDEMA  . Varenicline Tartrate        Medications: Outpatient Medications Prior to Visit  Medication Sig  . ALPRAZolam (XANAX) 0.25 MG tablet TAKE 1 TABLET BY MOUTH TWICE A DAY AS NEEDED FOR ANXIETY  . buPROPion (WELLBUTRIN XL) 300 MG 24 hr tablet Take 1 tablet (300 mg total) by mouth daily.  . fluconazole (DIFLUCAN) 200 MG tablet Take 200 mg by mouth once a week.  . hydrochlorothiazide (HYDRODIURIL) 25 MG tablet Take 1 tablet by mouth once daily  . Ibuprofen  (ADVIL PO) Take by mouth.  . losartan (COZAAR) 50 MG tablet Take 1 tablet (50 mg total) by mouth daily. (Patient taking differently: Take 100 mg by mouth daily.)  . Magnesium 125 MG CAPS Take by mouth 2 (two) times daily.  . Methylcobalamin (METHYL B-12 PO) Take 5,000 mcg by mouth.  Marland Kitchen NALTREXONE HCL PO Take 6 mg by mouth 2 (two) times daily.  . NP THYROID PO Take by mouth.  Marland Kitchen omeprazole (PRILOSEC) 40 MG capsule Take 1 capsule (40 mg total) by mouth 2 (two) times a day. (Patient taking differently: Take 40 mg by mouth daily.)  . sildenafil (REVATIO) 20 MG tablet TAKE 1 TABLET BY MOUTH THREE TIMES DAILY  . testosterone cypionate (DEPOTESTOSTERONE CYPIONATE) 200 MG/ML injection SMARTSIG:0.7 Milliliter(s) IM Once a Week  . Turmeric 500 MG CAPS Take by mouth.  . Vitamin D, Ergocalciferol, (DRISDOL) 1.25 MG (50000 UT) CAPS capsule Take 1 capsule (50,000 Units total) by mouth every 14 (fourteen) days.  . [DISCONTINUED] azithromycin (ZITHROMAX) 250 MG tablet Take 2 tablets on day 1 Then 1 tablet each day until complete  . [DISCONTINUED] budesonide (RHINOCORT ALLERGY) 32 MCG/ACT nasal spray Place 2 sprays into both nostrils daily. Reported on 03/31/2016  . [DISCONTINUED] buPROPion (WELLBUTRIN XL) 300 MG 24 hr tablet Take 1 tablet (300 mg total) by mouth daily.  . [DISCONTINUED] Coenzyme Q10 (COQ10) 200 MG CAPS Take by mouth.  . [DISCONTINUED] cyanocobalamin (,VITAMIN B-12,) 1000 MCG/ML injection Inject 1 mL (1,000 mcg total) into the muscle every 14 (fourteen) days.  . [DISCONTINUED] methocarbamol (ROBAXIN) 500 MG tablet Take 1 tablet (500 mg total) by mouth 2 (two) times daily.  . [DISCONTINUED] predniSONE (STERAPRED UNI-PAK 21 TAB) 10 MG (21) TBPK tablet Take by mouth daily. Take 6 tabs by mouth first day, then 5 tabs for 1 day, then 4 tabs for 1 day, then 3 tabs for 1 day, 2 tabs for 1 day, then 1 tab by mouth last day   No facility-administered medications prior to visit.    Review of Systems   Constitutional: Negative.   Respiratory: Negative.   Cardiovascular: Negative.        Objective    BP 138/86 (BP Location: Left Arm, Patient Position: Sitting, Cuff Size: Normal)   Pulse 77   Temp 98.3 F (36.8 C) (Oral)   Resp 16   Ht 5\' 10"  (1.778 m)   Wt 241 lb (109.3 kg)   BMI 34.58 kg/m  BP Readings from Last 3 Encounters:  10/15/20 138/86  07/29/20 (!) 148/86  04/22/20 (!) 144/83   Wt Readings from Last 3 Encounters:  10/15/20 241 lb (109.3 kg)  04/22/20 237 lb (107.5 kg)  03/04/20 237 lb (107.5 kg)      Physical Exam Vitals reviewed.  HENT:     Head: Normocephalic and atraumatic.     Right Ear: External ear normal.  Left Ear: External ear normal.     Nose: Nose normal.  Eyes:     General: No scleral icterus.    Conjunctiva/sclera: Conjunctivae normal.  Cardiovascular:     Rate and Rhythm: Normal rate and regular rhythm.     Pulses: Normal pulses.     Heart sounds: Normal heart sounds.  Pulmonary:     Effort: Pulmonary effort is normal.     Breath sounds: Normal breath sounds.  Abdominal:     Palpations: Abdomen is soft.  Musculoskeletal:     Right lower leg: No edema.     Left lower leg: No edema.  Lymphadenopathy:     Cervical: No cervical adenopathy.  Skin:    General: Skin is warm and dry.  Neurological:     General: No focal deficit present.     Mental Status: He is alert and oriented to person, place, and time.  Psychiatric:        Mood and Affect: Mood normal.        Behavior: Behavior normal.        Thought Content: Thought content normal.        Judgment: Judgment normal.       No results found for any visits on 10/15/20.  Assessment & Plan     1. Essential hypertension, benign Increase losartan 100 mg daily  2. Right hip pain Is post hip replacement.  Refer back to physical therapy. - Ambulatory referral to Physical Therapy  3. S/P hip replacement, right  - Ambulatory referral to Physical Therapy   No follow-ups  on file.      I, Wilhemena Durie, MD, have reviewed all documentation for this visit. The documentation on 10/18/20 for the exam, diagnosis, procedures, and orders are all accurate and complete.    Camari Quintanilla Cranford Mon, MD  [facility] [phone] (phone) [phone] (fax)  Antoine

## 2020-10-16 ENCOUNTER — Other Ambulatory Visit: Payer: Self-pay

## 2020-10-16 ENCOUNTER — Telehealth: Payer: Self-pay | Admitting: Family Medicine

## 2020-10-16 MED ORDER — LOSARTAN POTASSIUM 100 MG PO TABS
100.0000 mg | ORAL_TABLET | Freq: Every day | ORAL | 4 refills | Status: DC
Start: 2020-10-16 — End: 2020-10-17

## 2020-10-16 NOTE — Telephone Encounter (Signed)
Dr. Sullivan Lone is our of office tomorrow okay for me to give verbal to change to 50mg  BID? KW

## 2020-10-16 NOTE — Telephone Encounter (Signed)
[facility] does not have losartan 100 mg and they will not give patient 50 mg take 2 pill unless md reach out to them. [facility] [address] in  phone number [phone] . Pt was seen yesterday

## 2020-10-17 ENCOUNTER — Other Ambulatory Visit: Payer: Self-pay | Admitting: Adult Health

## 2020-10-17 MED ORDER — LOSARTAN POTASSIUM 50 MG PO TABS: 100.0000 mg | ORAL_TABLET | Freq: Every day | ORAL | 3 refills | Status: DC

## 2020-10-17 NOTE — Telephone Encounter (Signed)
Should be 100mg  daily

## 2020-10-17 NOTE — Telephone Encounter (Signed)
Meds ordered this encounter  Medications   losartan (COZAAR) 50 MG tablet    Sig: Take 2 tablets (100 mg total) by mouth daily.    Dispense:  120 tablet    Refill:  3      Provider sent to pharmacy as above.

## 2020-10-17 NOTE — Progress Notes (Signed)
Meds ordered this encounter  Medications  . losartan (COZAAR) 50 MG tablet    Sig: Take 2 tablets (100 mg total) by mouth daily.    Dispense:  120 tablet    Refill:  3    Was requested by patient that losartan 100 mg tablet not available please only fill one script.

## 2020-11-03 ENCOUNTER — Other Ambulatory Visit: Payer: Self-pay | Admitting: Internal Medicine

## 2020-11-18 ENCOUNTER — Ambulatory Visit: Payer: BLUE CROSS/BLUE SHIELD | Admitting: Cardiovascular Disease

## 2020-11-19 ENCOUNTER — Encounter: Payer: Self-pay | Admitting: Cardiovascular Disease

## 2020-11-19 ENCOUNTER — Other Ambulatory Visit: Payer: Self-pay

## 2020-11-19 ENCOUNTER — Ambulatory Visit (INDEPENDENT_AMBULATORY_CARE_PROVIDER_SITE_OTHER): Payer: Medicare Other | Admitting: Cardiovascular Disease

## 2020-11-19 VITALS — BP 140/88 | HR 79 | Ht 70.0 in | Wt 235.0 lb

## 2020-11-19 DIAGNOSIS — I10 Essential (primary) hypertension: Secondary | ICD-10-CM | POA: Diagnosis not present

## 2020-11-19 DIAGNOSIS — Z87891 Personal history of nicotine dependence: Secondary | ICD-10-CM

## 2020-11-19 DIAGNOSIS — E785 Hyperlipidemia, unspecified: Secondary | ICD-10-CM | POA: Diagnosis not present

## 2020-11-19 DIAGNOSIS — R0602 Shortness of breath: Secondary | ICD-10-CM

## 2020-11-19 NOTE — Patient Instructions (Addendum)
Medication Instructions:  No changes  Lab work: No new labs needed    Testing/Procedures: CT coronary calcium score (For high cholesterol)  $99 out of pocket expense  at our [facility] in [place]  This procedure uses special x-ray equipment to produce pictures of the coronary arteries to determine if they are blocked or narrowed by the buildup of plaque - an indicator for atherosclerosis or coronary artery disease (CAD).  Please call [phone] to schedule at your earliest convince   [facility] [address] [place], [place] [address]   Follow-Up:  . You will need a follow up appointment as needed  . Providers on your designated Care Team:   . Murray Hodgkins, NP . Christell Faith, PA-C . Marrianne Mood, PA-C  Any Other Special Instructions Will Be Listed Below (If Applicable).  COVID-19 Vaccine Information can be found at: ShippingScam.co.uk For questions related to vaccine distribution or appointments, please email [email or web address] or call [phone].

## 2020-11-19 NOTE — Progress Notes (Signed)
Cardiology Office Note  Date:  11/19/2020   ID:  OLUWAFEMI, VILLELLA Feb 11, 1956, MRN [id number]  PCP:  Jerrol Banana., MD   Chief Complaint  Patient presents with  . New Patient (Initial Visit)    Self referred. Patient c.o SOB. Meds reviewed verbally with patient.     HPI:  Mr. Natalie Leclaire is a 65 year old gentleman with past medical history of Testicular cancer, treated with chemo Pituitary adenoma, residual dizziness Former smoker, quit 65 yo Social alcohol use GERD Who presents by self-referral for evaluation of his cardiac risk factors, some shortness of breath  Reports recent stressors, Martin Majestic to urgent Care in December 2021 for shortness of breath and hypertension  Noted to have elevated BP 170/106. Losartan was doubled  Checks his blood pressure at home, reports typically in the 130 range over 70-80 range Occasional alcohol use, occasional marijuana use Does not feel that he needs more blood pressure medication Concerned about cardiac risk factors  Active, trying to start exercise program Denies any anginal symptoms  Prior CT scan reviewed from 2012 Cervical neck CT scan reviewed looking at carotids  EKG personally reviewed by myself on todays visit Normal sinus rhythm rate 79 bpm no significant ST or T wave changes   PMH:   has a past medical history of Arthritis, Cerebral aneurysm, COVID-19 virus infection (07/2020), History of colonic polyps, Hypertension, 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  PSH:    Past Surgical History:  Procedure Laterality Date  . ABDOMINAL EXPLORATION SURGERY  1978  . APPENDECTOMY     at same time as lymph node dissection  . ESOPHAGOGASTRODUODENOSCOPY  03/08    and colon  . INGUINAL HERNIA REPAIR  child   bilateral  . JOINT REPLACEMENT    . MOHS SURGERY Left nose   at [facility]--- 2013  . prolactinoma excision  09/04   [name]  . right testicle removed  1978  . SHOULDER OPEN ROTATOR CUFF  REPAIR Left 6/17   and biceps repair--[facility]  . stress nuclear  negative   10/09  . TONSILLECTOMY      Current Outpatient Medications  Medication Sig Dispense Refill  . ALPRAZolam (XANAX) 0.25 MG tablet TAKE 1 TABLET BY MOUTH TWICE A DAY AS NEEDED FOR ANXIETY 60 tablet 3  . buPROPion (WELLBUTRIN XL) 300 MG 24 hr tablet Take 1 tablet (300 mg total) by mouth daily. 90 tablet 3  . cholecalciferol (VITAMIN D3) 25 MCG (1000 UNIT) tablet Take 1,000 Units by mouth daily.    . cyanocobalamin 1000 MCG tablet Take 1,000 mcg by mouth daily.    . fluconazole (DIFLUCAN) 200 MG tablet Take 200 mg by mouth once a week.    . hydrochlorothiazide (HYDRODIURIL) 25 MG tablet Take 1 tablet by mouth once daily 90 tablet 0  . Ibuprofen (ADVIL PO) Take by mouth.    . losartan (COZAAR) 100 MG tablet Take 100 mg by mouth daily.    . Magnesium 125 MG CAPS Take by mouth 2 (two) times daily.    . Methylcobalamin (METHYL B-12 PO) Take 5,000 mcg by mouth.    . NP THYROID PO Take by mouth.    Marland Kitchen omeprazole (PRILOSEC) 40 MG capsule Take 1 capsule by mouth twice daily 180 capsule 0  . sildenafil (REVATIO) 20 MG tablet TAKE 1 TABLET BY MOUTH THREE TIMES DAILY 90 tablet 0  . Turmeric 500 MG CAPS Take by mouth.     No current facility-administered medications  for this visit.     Allergies:   Amlodipine and Varenicline tartrate   Social History:  The patient  reports that he quit smoking about 8 years ago. His smoking use included cigarettes. He has a 60.00 pack-year smoking history. He has never used smokeless tobacco. He reports current alcohol use of about 7.0 standard drinks of alcohol per week. He reports current drug use. Drug: Marijuana.   Family History:   family history includes AAA (abdominal aortic aneurysm) in his father; Brain cancer in his father; Cancer in his brother, paternal aunt, and another family member; Cerebral aneurysm in his paternal grandfather; Osteoporosis in his mother.    Review of  Systems: Review of Systems  Constitutional: Negative.   HENT: Negative.   Respiratory: Negative.   Cardiovascular: Negative.   Gastrointestinal: Negative.   Musculoskeletal: Negative.   Neurological: Negative.   Psychiatric/Behavioral: Negative.   All other systems reviewed and are negative.   PHYSICAL EXAM: VS:  BP 140/88 (BP Location: Right Arm, Patient Position: Sitting, Cuff Size: Normal)   Pulse 79   Ht 5\' 10"  (1.778 m)   Wt 235 lb (106.6 kg)   SpO2 95%   BMI 33.72 kg/m  , BMI Body mass index is 33.72 kg/m. GEN: Well nourished, well developed, in no acute distress HEENT: normal Neck: no JVD, carotid bruits, or masses Cardiac: RRR; no murmurs, rubs, or gallops,no edema  Respiratory:  clear to auscultation bilaterally, normal work of breathing GI: soft, nontender, nondistended, + BS MS: no deformity or atrophy Skin: warm and dry, no rash Neuro:  Strength and sensation are intact Psych: euthymic mood, full affect   Recent Labs: 04/22/2020: ALT 22; BUN 12; Creatinine, Ser 1.40; Hemoglobin 17.8; Platelets 171; Potassium 4.1; Sodium 138; TSH 1.410    Lipid Panel Lab Results  Component Value Date   CHOL 201 (H) 04/22/2020   HDL 36 (L) 04/22/2020   LDLCALC 130 (H) 04/22/2020   TRIG 196 (H) 04/22/2020      Wt Readings from Last 3 Encounters:  11/19/20 235 lb (106.6 kg)  10/15/20 241 lb (109.3 kg)  04/22/20 237 lb (107.5 kg)      ASSESSMENT AND PLAN:  Problem List Items Addressed This Visit      Cardiology Problems   Essential hypertension, benign   Relevant Medications   losartan (COZAAR) 100 MG tablet   Other Relevant Orders   EKG 12-Lead   Hyperlipidemia LDL goal <100   Relevant Medications   losartan (COZAAR) 100 MG tablet   Other Relevant Orders   EKG 12-Lead   CT CARDIAC SCORING (SELF PAY ONLY)    Other Visit Diagnoses    Shortness of breath    -  Primary   Former smoker         Shortness of breath Prior smoking history, weight up,  recommended exercise program Risk stratification with CT coronary calcium scoring  Hyperlipidemia Prefers not to be on a statin Calcium scoring ordered as above, If elevated could start Zetia  HTN Monitor for now,  We have encouraged continued exercise, careful diet management in an effort to lose weight.  Former smoker Stopped 10 years ago, calcium scoring as above, will evaluate lungs    Total encounter time more than 45 minutes  Greater than 50% was spent in counseling and coordination of care with the patient    Signed, Esmond Plants, M.D., Ph.D. [place], [place]

## 2020-11-20 ENCOUNTER — Ambulatory Visit
Admission: RE | Admit: 2020-11-20 | Discharge: 2020-11-20 | Disposition: A | Discharge status: Home / Self Care | Payer: Medicare Other | Source: Ambulatory Visit | Attending: Cardiovascular Disease | Admitting: Cardiovascular Disease

## 2020-11-20 DIAGNOSIS — E785 Hyperlipidemia, unspecified: Secondary | ICD-10-CM

## 2020-11-24 ENCOUNTER — Telehealth: Payer: Self-pay

## 2020-11-24 NOTE — Telephone Encounter (Signed)
Able to reach pt regarding his recent Coronary calcium score, Dr. Rockey Situ had a chance to review his results and advised  "Low score overall, 138,  But is 61st percentile for age and sex matched control.  Will defer to him whether he would like medications for cholesterol" At this time, pt would like to defer medications, wants to try life style changes, currently in physical therapy so he can get back to exercising regularly and is watching what he is eating, reports will call in for a f/u appt when needed or if there are any concerns. Otherwise all questions or concerns were address and no additional concerns at this time. Agreeable to plan, will call back for anything further.

## 2020-11-27 ENCOUNTER — Other Ambulatory Visit: Payer: Self-pay | Admitting: Family Medicine

## 2020-11-27 MED ORDER — HYDROCHLOROTHIAZIDE 25 MG PO TABS
25.0000 mg | ORAL_TABLET | Freq: Every day | ORAL | 0 refills | Status: DC
Start: 2020-11-27 — End: 2021-02-22

## 2020-11-27 NOTE — Telephone Encounter (Signed)
  Notes to clinic:  Patient requesting medication that was filled by a different provider Review for refill    Requested Prescriptions  Pending Prescriptions Disp Refills   hydrochlorothiazide (HYDRODIURIL) 25 MG tablet 90 tablet 0    Sig: Take 1 tablet (25 mg total) by mouth daily.      Cardiovascular: Diuretics - Thiazide Failed - 11/27/2020 11:41 AM      Failed - Cr in normal range and within 360 days    Creatinine  Date Value Ref Range Status  07/18/2014 1.05 0.60 - 1.30 mg/dL Final   Creatinine, Ser  Date Value Ref Range Status  04/22/2020 1.40 (H) 0.76 - 1.27 mg/dL Final   Creatinine,U  Date Value Ref Range Status  08/17/2017 95.3 mg/dL Final          Failed - Last BP in normal range    BP Readings from Last 1 Encounters:  11/19/20 140/88          Passed - Ca in normal range and within 360 days    Calcium  Date Value Ref Range Status  04/22/2020 9.7 8.6 - 10.2 mg/dL Final   Calcium, Total  Date Value Ref Range Status  07/18/2014 8.5 8.5 - 10.1 mg/dL Final          Passed - K in normal range and within 360 days    Potassium  Date Value Ref Range Status  04/22/2020 4.1 3.5 - 5.2 mmol/L Final  07/18/2014 4.4 3.5 - 5.1 mmol/L Final          Passed - Na in normal range and within 360 days    Sodium  Date Value Ref Range Status  04/22/2020 138 134 - 144 mmol/L Final  07/18/2014 138 136 - 145 mmol/L Final          Passed - Valid encounter within last 6 months    Recent Outpatient Visits           1 month ago Essential hypertension, benign   [facility] Jerrol Banana., MD   4 months ago COVID-19 virus infection   [facility] Jerrol Banana., MD   7 months ago Essential hypertension, benign   [facility] Jerrol Banana., MD   8 months ago [facility] [employer] preventive visit   [facility] Jerrol Banana., MD   11 months ago Essential hypertension, benign    [facility] Jerrol Banana., MD

## 2020-11-27 NOTE — Telephone Encounter (Signed)
Medication Refill - Medication: HCTZ  Has the patient contacted their pharmacy? Yes.   (Agent: If no, request that the patient contact the pharmacy for the refill.) (Agent: If yes, when and what did the pharmacy advise?) The pharmacy called for the refill request  Preferred Pharmacy (with phone number or street name): [place], [place]  Agent: Please be advised that RX refills may take up to 3 business days. We ask that you follow-up with your pharmacy.

## 2021-01-15 ENCOUNTER — Other Ambulatory Visit: Payer: Self-pay

## 2021-01-15 ENCOUNTER — Encounter: Payer: Self-pay | Admitting: Physician Assistant

## 2021-01-15 ENCOUNTER — Ambulatory Visit (INDEPENDENT_AMBULATORY_CARE_PROVIDER_SITE_OTHER): Payer: Medicare Other | Admitting: Physician Assistant

## 2021-01-15 VITALS — BP 124/80 | HR 78 | Ht 70.0 in | Wt 232.0 lb

## 2021-01-15 DIAGNOSIS — E785 Hyperlipidemia, unspecified: Secondary | ICD-10-CM | POA: Diagnosis not present

## 2021-01-15 DIAGNOSIS — Z87891 Personal history of nicotine dependence: Secondary | ICD-10-CM

## 2021-01-15 DIAGNOSIS — K219 Gastro-esophageal reflux disease without esophagitis: Secondary | ICD-10-CM | POA: Diagnosis not present

## 2021-01-15 DIAGNOSIS — I10 Essential (primary) hypertension: Secondary | ICD-10-CM | POA: Diagnosis not present

## 2021-01-15 MED ORDER — EZETIMIBE 10 MG PO TABS: 10.0000 mg | ORAL_TABLET | Freq: Every day | ORAL | 5 refills | Status: DC

## 2021-01-15 MED ORDER — VALSARTAN 160 MG PO TABS: 160.0000 mg | ORAL_TABLET | Freq: Every day | ORAL | 5 refills | Status: DC

## 2021-01-15 NOTE — Progress Notes (Addendum)
.    Office Visit    Patient Name: Dustin [name] Date of Encounter: 01/15/2021  PCP:  Jerrol Banana., MD   [facility]  Cardiologist:  Ida Rogue, MD  Advanced Practice Provider:  No care team member to display Electrophysiologist:  None :[phone]}   Chief Complaint    Chief Complaint  Patient presents with  . OTHER    C/o BP still being high. Medications verbally reviewed with patient.     65 yo with history of HTN, testicular cancer treated with chemo, pituitary adenoma with residual weakness, former smoker (quit tobacco 2010), occasional and GERD,and who is seen today for 1 month follow-up,  Past Medical History    Past Medical History:  Diagnosis Date  . Arthritis   . Family history of cerebral aneurysm   . History of colonic polyps   . History of COVID-19 07/2020  . Hypertension   . Prolactinoma ([place]) 08/04  . 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 ABDOMINAL EXPLORATION SURGERY  1978  . APPENDECTOMY     at same time as lymph node dissection  . ESOPHAGOGASTRODUODENOSCOPY  03/08    and colon  . INGUINAL HERNIA REPAIR  child   bilateral  . JOINT REPLACEMENT    . MOHS SURGERY Left nose   at [facility]--- 2013  . prolactinoma excision  09/04   Dustin [name]  . right testicle removed  1978  . SHOULDER OPEN ROTATOR CUFF REPAIR Left 6/17   and biceps repair--[facility]  . stress nuclear  negative   10/09  . TONSILLECTOMY      Allergies  Allergies  Allergen Reactions  . Amlodipine Swelling    LE EDEMA  . Varenicline Tartrate     History of Present Illness    Dustin [name] is a 65 y.o. male with PMH as above.  PMH includes testicular cancer s/p chemotherapy, pituitary adenoma, previous tobacco use, GERD, and elevated BP/hypertension.  He is seen today for follow-up of elevated blood pressure.  On 11/20/2020, he underwent cardiac CT/coronary artery calcium  scoring with CAC score of 138, placing him in the 61st percentile for age and sex matched control.  Recommendation was for ASA and statin if no contraindications.  Today, 01/15/2021, he presents to clinic for follow-up of his coronary CT and due to report of elevated pressures.  Clinic BP well controlled.  He does monitor his blood pressure at home with highest systolic 245 and diastolic 809.  He reports he feels his best with SBP in the 140s.  When SBP low 100s, he usually feels weak and somewhat dizzy.  The lowest SBP seen at home is 983 with diastolic 70. He reports that he usually gets a left-sided pinpoint headache when his pressure is elevated.  He will usually come inside and rest after that time.  No chest pain, racing heart rate, palpitations, presyncope, or syncope.  No early satiety, orthopnea, PND.  He denies any weight gain.  He reports that his wife usually has a high salt diet, and he has been trying to have a low-salt diet himself.  He does not add any salt to his food.  He is trying to be more mindful of salt intake going forward.  Statin was discussed with patient preference to avoid any statin medications due to poor reaction in the past.  PCSK9 inhibitors, such as Repatha, were discussed with patient  preference to defer for now.  Also discussed with Zetia with recommendations as below.  He does indicate a preference to consolidate or reduce medications, if possible.  On further discussion of diuresis, he indicates that at times he does have some abdominal bloating; however, this does occur at times with food.  At other times, his bloating does not seem connected to food.  No other signs or symptoms of volume overload.  No signs or symptoms of bleeding.  He reports medication compliance.  Home Medications    Current Outpatient Medications on File Prior to Visit  Medication Sig Dispense Refill  . buPROPion (WELLBUTRIN XL) 300 MG 24 hr tablet Take 1 tablet (300 mg total) by mouth daily. 90  tablet 3  . cholecalciferol (VITAMIN D3) 25 MCG (1000 UNIT) tablet Take 1,000 Units by mouth daily.    . fluconazole (DIFLUCAN) 200 MG tablet Take 200 mg by mouth once a week.    . hydrochlorothiazide (HYDRODIURIL) 25 MG tablet Take 1 tablet (25 mg total) by mouth daily. 90 tablet 0  . Magnesium 125 MG CAPS Take by mouth 2 (two) times daily.    . meloxicam (MOBIC) 15 MG tablet Take 1 tablet by mouth daily.    Marland Kitchen omeprazole (PRILOSEC) 40 MG capsule Take 1 capsule by mouth twice daily 180 capsule 0  . sildenafil (REVATIO) 20 MG tablet TAKE 1 TABLET BY MOUTH THREE TIMES DAILY 90 tablet 0  . Turmeric 500 MG CAPS Take by mouth.     No current facility-administered medications on file prior to visit.    Review of Systems    He reports elevated blood pressure, intermittent bloating/abdominal distention with unclear triggers but at times associated with food, intermittent headache/weakness with lower pressure. He denies chest pain, palpitations, dyspnea, pnd, orthopnea, n, v, dizziness, syncope, edema, weight gain, or early satiety.   All other systems reviewed and are otherwise negative except as noted above.  Physical Exam    VS:  BP 124/80 (BP Location: Left Arm, Patient Position: Sitting, Cuff Size: Normal)   Pulse 78   Ht 5\' 10"  (1.778 m)   Wt 232 lb (105.2 kg)   SpO2 97%   BMI 33.29 kg/m  , BMI Body mass index is 33.29 kg/m. GEN: Well nourished, well developed, in no acute distress. HEENT: normal. Neck: Supple, no JVD, carotid bruits, or masses. Cardiac: RRR, no murmurs, rubs, or gallops. No clubbing, cyanosis, edema.  Radials/DP/PT 2+ and equal bilaterally.  Respiratory:  Respirations regular and unlabored, clear to auscultation bilaterally. GI: Firm, nontender, slightly distended, BS + x 4. MS: Bilateral trace/mild nonpitting edema. Skin: warm and dry, no rash. Neuro:  Strength and sensation are intact. Psych: Normal affect.  Accessory Clinical Findings    ECG personally  reviewed by me today -no EKG- no acute changes.  VITALS Reviewed today   Temp Readings from Last 3 Encounters:  10/15/20 98.3 F (36.8 C) (Oral)  07/29/20 98.1 F (36.7 C) (Oral)  04/22/20 (!) 97.3 F (36.3 C) (Other (Comment))   BP Readings from Last 3 Encounters:  01/15/21 124/80  11/19/20 140/88  10/15/20 138/86   Pulse Readings from Last 3 Encounters:  01/15/21 78  11/19/20 79  10/15/20 77    Wt Readings from Last 3 Encounters:  01/15/21 232 lb (105.2 kg)  11/19/20 235 lb (106.6 kg)  10/15/20 241 lb (109.3 kg)     LABS  reviewed today   Lab Results  Component Value Date   WBC 6.3 04/22/2020  HGB 17.8 (H) 04/22/2020   HCT 52.1 (H) 04/22/2020   MCV 95 04/22/2020   PLT 171 04/22/2020   Lab Results  Component Value Date   CREATININE 1.40 (H) 04/22/2020   BUN 12 04/22/2020   NA 138 04/22/2020   K 4.1 04/22/2020   CL 99 04/22/2020   CO2 27 04/22/2020   Lab Results  Component Value Date   ALT 22 04/22/2020   AST 19 04/22/2020   ALKPHOS 85 04/22/2020   BILITOT 0.4 04/22/2020   Lab Results  Component Value Date   CHOL 201 (H) 04/22/2020   HDL 36 (L) 04/22/2020   LDLCALC 130 (H) 04/22/2020   LDLDIRECT 129.0 05/10/2019   TRIG 196 (H) 04/22/2020   CHOLHDL 5.6 (H) 04/22/2020    No results found for: HGBA1C Lab Results  Component Value Date   TSH 1.410 04/22/2020     STUDIES/PROCEDURES reviewed today   Echo 2018 - Left ventricle: The cavity size was normal. Wall thickness was  normal. Systolic function was vigorous. The estimated ejection  fraction was in the range of 65% to 70%. Wall motion was normal;  there were no regional wall motion abnormalities. Left  ventricular diastolic function parameters were normal for the  patient&'s age.   CT Score IMPRESSION: 1. Coronary calcium score of 138. This was 61st percentile for age and sex matched control. 2.  Recommend asa and statin if no contraindications. LM 0  LAD 0  LCx  138  RCA 0  Total 138  Assessment & Plan    HTN --BP today well controlled at 124/80.  He reports higher pressures at home/labile pressures.  He states his blood pressure today is atypical.  Will stop losartan and transition to valsartan 160 mg daily, which is the equivalent dose.  BMET today and again in 2 weeks.  At RTC, if BP still elevated, consider transitioning HCTZ to torsemide, as slight abdominal distention noted today. Reviewed salt, fluid intake.  HLD --04/2020 cholesterol 201 with LDL 130.  Recommend initiation of statin with patient preference to avoid given poor tolerance in the past.  Coronary artery calcium score as above with total score 138, placing him in the 61st percentile for age and sex matched control with recommendation for ASA and statin if no contraindication.  We discussed PCSK9 inhibitors with patient preference to defer.  Agreeable to start Zetia 10 mg daily.  Given his history of medication intolerances, plan is to start valsartan 160 mg daily first and then Zetia 10 mg daily the following week, so that we are able to tease side effects if any.  Recheck lipid and liver function in 6 to 8 weeks.  Former smoker --Tobacco cessation in 2010.  Ongoing cessation recommended.   GERD --Continue PPI, taken as needed.   Medication changes: Start valsartan 160 mg daily.  Stop losartan.  1 week after starting valsartan, start Zetia 10 mg daily. Labs ordered: BMET now, repeat BMET 2 weeks, LDL/LFTs 6-8 weeks Studies / Imaging ordered: None Future considerations: Switch HCTZ to torsemide Disposition: RTC 2 weeks   Arvil Chaco, PA-C 01/15/2021

## 2021-01-15 NOTE — Patient Instructions (Addendum)
Medication Instructions:  Your physician has recommended you make the following change in your medication:   STOP Losartan  START Valsartan 160mg  daily  1 week AFTER starting Valsartan, START Zetia 10mg  daily (to ensure if you have side effects of either medication, we know which medication to associate)  *If you need a refill on your cardiac medications before your next appointment, please call your pharmacy*   Lab Work:  We will repeat lab work at your next follow up visit in 2 weeks.    Testing/Procedures: None ordered   Follow-Up: At [facility], you and your health needs are our priority.  As part of our continuing mission to provide you with exceptional heart care, we have created designated Provider Care Teams.  These Care Teams include your primary Cardiologist (physician) and Advanced Practice Providers (APPs -  Physician Assistants and Nurse Practitioners) who all work together to provide you with the care you need, when you need it.  We recommend signing up for the patient portal called "MyChart".  Sign up information is provided on this After Visit Summary.  MyChart is used to connect with patients for Virtual Visits (Telemedicine).  Patients are able to view lab/test results, encounter notes, upcoming appointments, etc.  Non-urgent messages can be sent to your provider as well.   To learn more about what you can do with MyChart, go to [email or web address].    Your next appointment:   2 week(s)  The format for your next appointment:   In Person  Provider:   You may see Ida Rogue, MD or one of the following Advanced Practice Providers on your designated Care Team:    Murray Hodgkins, NP  Christell Faith, PA-C  Marrianne Mood, PA-C  Cadence Kathlen Mody, [place]  Laurann [place], NP    Other Instructions  Blood pressure: --We recommend upper arm BP cuffs over that of the wrist. --You can always check your device's accuracy against an office model once a  year if possible. You can do so by bringing your cuff into the office and notifying the person that rooms you that you would like to check your cuff against our office readings. --Measure your BP at the same time each day. Blood pressure varies often throughout the day with higher readings in the morning. BP may also be lower at home than in the office. Do not measure BP right after you wake up or after exercising. Avoid caffeine, tobacco, and alcohol for 30 minutes before taking a measurement.  --Sit quietly for five minutes in a comfortable position with your legs and ankles uncrossed and back supported. Feet flat on the ground. Have your arm supported and at the level of your heart. Always use the same arm when taking your blood pressure. Place the cuff over bare skin rather than clothing. Each time you measure, take an additional reading if abnormal to ensure accurate by waiting 1-3 minutes after the first reading. --Please bring a BP log into the office. It is helpful to document the time of each BP reading, as well as any activity or medications taken around the reading. In addition, it is helpful to include heart rate at the time of the BP reading. Daily weights are also encouraged. --Goal BP is 130/80 or lower. If your BP is low but you are not dizzy, this is fine and preferred over BP higher than 130/80. If your BP is less than 100 for the top number and you are dizzy, call the office. If your blood  pressure is consistently elevated with top number above 180 and bottom above 120, this can damage the body. If you have severe increase in your blood pressure or concerning symptoms of severe chest pain, headache with confusion and blurred vision, severe abdominal or back pain, shortness of breath, seizures, or loss of consciousness, go to the emergency department.

## 2021-01-28 NOTE — Progress Notes (Signed)
.    Office Visit    Patient Name: Dustin [name] Date of Encounter: 01/29/2021  PCP:  Jerrol Banana., MD   [facility]  Cardiologist:  Ida Rogue, MD  Advanced Practice Provider:  No care team member to display Electrophysiologist:  None :[phone]}   Chief Complaint    Chief Complaint  Patient presents with  . Follow-up    2 week F/U    65 yo with history of HTN, testicular cancer treated with chemo, pituitary adenoma with residual weakness, former smoker (quit tobacco 2010),  GERD,and who is seen today for 1 month follow-up,  Past Medical History    Past Medical History:  Diagnosis Date  . Arthritis   . Family history of cerebral aneurysm   . History of colonic polyps   . History of COVID-19 07/2020  . Hypertension   . 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 ABDOMINAL EXPLORATION SURGERY  1978  . APPENDECTOMY     at same time as lymph node dissection  . ESOPHAGOGASTRODUODENOSCOPY  03/08    and colon  . INGUINAL HERNIA REPAIR  child   bilateral  . JOINT REPLACEMENT    . MOHS SURGERY Left nose   at [facility]--- 2013  . prolactinoma excision  09/04   [name]  . right testicle removed  1978  . SHOULDER OPEN ROTATOR CUFF REPAIR Left 6/17   and biceps repair--[facility]  . stress nuclear  negative   10/09  . TONSILLECTOMY      Allergies  Allergies  Allergen Reactions  . Amlodipine Swelling    LE EDEMA  . Varenicline Tartrate     History of Present Illness    Dustin [name] is a 65 y.o. male with PMH as above.  Dad had HBP and cerebral aneurysm. PMH includes testicular cancer s/p chemotherapy, pituitary adenoma, previous tobacco use (65yo-20s with more smoking in 20s), GERD, and elevated BP/hypertension.  He is seen today for follow-up of elevated blood pressure.  On 11/20/2020, he underwent cardiac CT/coronary artery  calcium scoring with CAC score of 138, placing him in the 61st percentile for age and sex matched control.  Recommendation was for ASA and statin if no contraindications.  Seen 01/15/2021 with clinic BP well controlled.  He reported max SBP 165 and DBP 12 and min SBP 107 with DBP 70.  He reported feeling his best at SBP 140s and weak/presyncope at SBP low 100s.  He had a left-sided pinpoint headache when BP elevated, usually prompting him to come in and rest.  His wife had a high salt diet, though he was attempting to have a low salt diet himself.  Statin discussed with patient preference to avoid due to poor tolerance in the past.  PCSK9 inhibitors discussed but deferred Zetia discussed and started after valsartan 160 mg for further BP control.  He indicated a desire to consolidate medications, if possible.  He did discuss some abdominal bloating, at times with food.  Reassessment of HCTZ versus torsemide to current RTC.  Today, 01/29/2021, he returns to clinic and notes that he is overall doing well from a cardiovascular standpoint.  He reports tolerating Zetia very well since 4/15 and without any of the symptoms experienced when he was on his statin.  He did notice that the very first dose of his valsartan was associated with a significant drop  in pressure and associated fatigue; however, he feels as if this may be 2/2 the timing of that first dose.  Since that time, he has tolerated valsartan well.  He reports home SBP 155-1 65 with DBP 90s in the afternoon and before taking his medication.  Today, SBP 120 with patient report that he feels fine at this pressure.  No recent chest pain, racing heart rate, palpitations, presyncope, syncope.  He denies any orthopnea, PND, abdominal distention, or early satiety.  He does note shortness of breath with activity.  Specifically, he experiences shortness of breath with walking approximately 100 yards.  He reports that this dyspnea has been stable for some time and is  worse with quicker/faster activity.  For example, he recently went hunting, and while walking slowly on flat ground, his breathing stayed about the same.  No chest pain or shortness of breath at that time.  He wonders if sedentary lifestyle is contributing to his dyspnea.  He mentions his dyspnea, as he is concerned regarding his risk for a coronary event, given the amount of time he spends quite a distance from any hospital or medical care.  He otherwise reports that he has been in his usual state of health, other than some right armpit pain.  He reports headaches at the end of the day.  He does note anxiety, usually alleviated by deeper breathing through the nose and the mouth. Of note, after today's visit, he is on his way to [place] to go boating.  Home Medications    Current Outpatient Medications on File Prior to Visit  Medication Sig Dispense Refill  . buPROPion (WELLBUTRIN XL) 300 MG 24 hr tablet Take 1 tablet (300 mg total) by mouth daily. 90 tablet 3  . cholecalciferol (VITAMIN D3) 25 MCG (1000 UNIT) tablet Take 1,000 Units by mouth daily.    Marland Kitchen ezetimibe (ZETIA) 10 MG tablet Take 1 tablet (10 mg total) by mouth daily. 30 tablet 5  . fluconazole (DIFLUCAN) 200 MG tablet Take 200 mg by mouth once a week.    . hydrochlorothiazide (HYDRODIURIL) 25 MG tablet Take 1 tablet (25 mg total) by mouth daily. 90 tablet 0  . meloxicam (MOBIC) 15 MG tablet Take 1 tablet by mouth daily.    Marland Kitchen omeprazole (PRILOSEC) 40 MG capsule Take 1 capsule by mouth twice daily 180 capsule 0  . sildenafil (REVATIO) 20 MG tablet TAKE 1 TABLET BY MOUTH THREE TIMES DAILY 90 tablet 0  . valsartan (DIOVAN) 160 MG tablet Take 1 tablet (160 mg total) by mouth daily. 30 tablet 5   No current facility-administered medications on file prior to visit.    Review of Systems    He reports elevated blood pressure, dyspnea with minimal exertion, intermittent headache. He denies chest pain, palpitations, pnd, orthopnea, n, v,  dizziness, syncope, edema, weight gain, or early satiety.   He reports resolution of previous bloating.  All other systems reviewed and are otherwise negative except as noted above.  Physical Exam    VS:  BP 120/84 (BP Location: Left Arm, Patient Position: Sitting, Cuff Size: Large)   Pulse 75   Ht 5\' 10"  (1.778 m)   Wt 228 lb (103.4 kg)   SpO2 95%   BMI 32.71 kg/m  , BMI Body mass index is 32.71 kg/m. GEN: Well nourished, well developed, in no acute distress.  Facemask in place. HEENT: normal. Neck: Supple, no JVD, carotid bruits, or masses. Cardiac: RRR, no murmurs, rubs, or gallops. No clubbing, cyanosis,  no pitting edema.  Radials/DP/PT 2+ and equal bilaterally.  Respiratory:  Respirations regular and unlabored, clear to auscultation bilaterally. GI: non-distended, nontender, BS + x 4. MS: No deformities noted Skin: warm and dry, no rash. Neuro:  Strength and sensation are intact. Psych: Normal affect.  Accessory Clinical Findings    ECG personally reviewed by me today -NSR, 75 bpm, PR interval 136, QRS 96, poor R wave progression [name], aVF as in previous, QTc 435, no acute changes from previous.  VITALS Reviewed today   Temp Readings from Last 3 Encounters:  10/15/20 98.3 F (36.8 C) (Oral)  07/29/20 98.1 F (36.7 C) (Oral)  04/22/20 (!) 97.3 F (36.3 C) (Other (Comment))   BP Readings from Last 3 Encounters:  01/29/21 120/84  01/15/21 124/80  11/19/20 140/88   Pulse Readings from Last 3 Encounters:  01/29/21 75  01/15/21 78  11/19/20 79    Wt Readings from Last 3 Encounters:  01/29/21 228 lb (103.4 kg)  01/15/21 232 lb (105.2 kg)  11/19/20 235 lb (106.6 kg)     LABS  reviewed today   Lab Results  Component Value Date   WBC 6.3 04/22/2020   HGB 17.8 (H) 04/22/2020   HCT 52.1 (H) 04/22/2020   MCV 95 04/22/2020   PLT 171 04/22/2020   Lab Results  Component Value Date   CREATININE 1.40 (H) 04/22/2020   BUN 12 04/22/2020   NA 138 04/22/2020   K 4.1  04/22/2020   CL 99 04/22/2020   CO2 27 04/22/2020   Lab Results  Component Value Date   ALT 22 04/22/2020   AST 19 04/22/2020   ALKPHOS 85 04/22/2020   BILITOT 0.4 04/22/2020   Lab Results  Component Value Date   CHOL 201 (H) 04/22/2020   HDL 36 (L) 04/22/2020   LDLCALC 130 (H) 04/22/2020   LDLDIRECT 129.0 05/10/2019   TRIG 196 (H) 04/22/2020   CHOLHDL 5.6 (H) 04/22/2020    No results found for: HGBA1C Lab Results  Component Value Date   TSH 1.410 04/22/2020     STUDIES/PROCEDURES reviewed today   Echo 2018 - Left ventricle: The cavity size was normal. Wall thickness was  normal. Systolic function was vigorous. The estimated ejection  fraction was in the range of 65% to 70%. Wall motion was normal;  there were no regional wall motion abnormalities. Left  ventricular diastolic function parameters were normal for the  patient&'s age.   CAC Score 11/20/2020 IMPRESSION: 1. Coronary calcium score of 138. This was 61st percentile for age and sex matched control. 2.  Recommend asa and statin if no contraindications. LM 0  LAD 0  LCx 138  RCA 0  Total 138  Assessment & Plan    HTN, BP goal below 130/80 --BP today well controlled at 120/84.  Recently transitioned from losartan to valsartan 160 mg daily, and as this valsartan dose is the equivalent dose for previous losartan, BP today relatively unchanged from previous.  Ideally, would escalate antihypertensives at this time; however given his upcoming trip to [place], we will defer from any medication changes prior to his long car drive.  Reports SBP into the 150s at home, at RTC recommend increased dose valsartan if SBP still elevated at that time.  Given his upcoming trip, will also defer from any changes to his HCTZ at this time, including previous documentation that he may benefit from changing from HCTZ to torsemide in the future.  BMET today to recheck renal function electrolytes.  Reviewed salt,  fluid intake.  HLD, LDL goal below 70 Statin intolerance --04/2020 cholesterol 201 with LDL 130.  Reports intolerance to statins with myalgias, as previously noted.  Started on Zetia at his previous visit with patient tolerating this medication well.  Recheck lipid and liver function in 6 to 8 weeks.  If LDL still not at goal at that time, revisit PCSK9 inhibitor/ Incliseran for optimized LDL to control.   Coronary artery calcium score 138 -- No chest pain; however, he does report dyspnea with minimal exertion.  Recent CAC score 138, placing him in the 61st percentile for age and sex matched control with recommendation for ASA and statin if no contraindication.  Started on Zetia at his previous clinic visit as above.  Continue ASA 81 mg daily.  He notes concern today regarding his ongoing dyspnea with ambulation at approximately 100 feet.  Risk factors for coronary insufficiency include age, male, CAC score, previous history of smoking, hypertension, and hyperlipidemia.  We will schedule for coronary CTA to rule out dyspnea as anginal equivalent.  Former smoker --Tobacco cessation in 2010.  Ongoing cessation recommended.   GERD --Continue PPI, taken as needed.   Medication changes: No medication changes. Labs ordered: BMET now,  LDL/LFTs 6-8 weeks Studies / Imaging ordered: Coronary CTA Future considerations: Switch HCTZ to torsemide or ncrease dose valsartan if indicated Disposition: RTC following coronary CTA /4 weeks   Arvil Chaco, PA-C 01/29/2021

## 2021-01-29 ENCOUNTER — Other Ambulatory Visit: Payer: Self-pay

## 2021-01-29 ENCOUNTER — Encounter: Payer: Self-pay | Admitting: Physician Assistant

## 2021-01-29 ENCOUNTER — Ambulatory Visit (INDEPENDENT_AMBULATORY_CARE_PROVIDER_SITE_OTHER): Payer: Medicare Other | Admitting: Physician Assistant

## 2021-01-29 VITALS — BP 120/84 | HR 75 | Ht 70.0 in | Wt 228.0 lb

## 2021-01-29 DIAGNOSIS — I10 Essential (primary) hypertension: Secondary | ICD-10-CM

## 2021-01-29 DIAGNOSIS — R0609 Other forms of dyspnea: Secondary | ICD-10-CM

## 2021-01-29 DIAGNOSIS — Z789 Other specified health status: Secondary | ICD-10-CM

## 2021-01-29 DIAGNOSIS — Z8249 Family history of ischemic heart disease and other diseases of the circulatory system: Secondary | ICD-10-CM

## 2021-01-29 DIAGNOSIS — Z87891 Personal history of nicotine dependence: Secondary | ICD-10-CM | POA: Diagnosis not present

## 2021-01-29 DIAGNOSIS — E785 Hyperlipidemia, unspecified: Secondary | ICD-10-CM

## 2021-01-29 DIAGNOSIS — K219 Gastro-esophageal reflux disease without esophagitis: Secondary | ICD-10-CM | POA: Diagnosis not present

## 2021-01-29 DIAGNOSIS — R9430 Abnormal result of cardiovascular function study, unspecified: Secondary | ICD-10-CM

## 2021-01-29 DIAGNOSIS — R0600 Dyspnea, unspecified: Secondary | ICD-10-CM

## 2021-01-29 DIAGNOSIS — R931 Abnormal findings on diagnostic imaging of heart and coronary circulation: Secondary | ICD-10-CM | POA: Diagnosis not present

## 2021-01-29 MED ORDER — METOPROLOL TARTRATE 100 MG PO TABS: 100.0000 mg | ORAL_TABLET | Freq: Once | ORAL | 0 refills | Status: DC

## 2021-01-29 NOTE — Patient Instructions (Addendum)
Medication Instructions:  Your physician recommends that you continue on your current medications as directed. Please refer to the Current Medication list given to you today.  *If you need a refill on your cardiac medications before your next appointment, please call your pharmacy*   Lab Work: Your physician recommends that you have lab work today: BMET  If you have labs (blood work) drawn today and your tests are completely normal, you will receive your results only by: Marland Kitchen MyChart Message (if you have MyChart) OR . A paper copy in the mail If you have any lab test that is abnormal or we need to change your treatment, we will call you to review the results.   Testing/Procedures: Your cardiac CT will be scheduled at one of the below locations:   [facility] [address] [place], [place] [address] [phone]  [place] [address] [place], [place] [address] [phone]  If scheduled at [facility], please arrive at the [facility] main entrance (entrance A) of [facility] 30 minutes prior to test start time. Proceed to the [facility] Radiology Department (first floor) to check-in and test prep.  If scheduled at [facility], please arrive 15 mins early for check-in and test prep.  Please follow these instructions carefully (unless otherwise directed):  Hold all erectile dysfunction medications at least 3 days (72 hrs) prior to test.  On the Night Before the Test: . Be sure to Drink plenty of water. . Do not consume any caffeinated/decaffeinated beverages or chocolate 12 hours prior to your test. . Do not take any antihistamines 12 hours prior to your test.   On the Day of the Test: . Drink plenty of water until 1 hour prior to the test. . Do not eat any food 4 hours prior to the test. . You may take your regular medications prior to the test.  . Take  metoprolol (Lopressor) two hours prior to test. . HOLD Furosemide/Hydrochlorothiazide morning of the test.         After the Test: . Drink plenty of water. . After receiving IV contrast, you may experience a mild flushed feeling. This is normal. . On occasion, you may experience a mild rash up to 24 hours after the test. This is not dangerous. If this occurs, you can take Benadryl 25 mg and increase your fluid intake. . If you experience trouble breathing, this can be serious. If it is severe call 911 IMMEDIATELY. If it is mild, please call our office. . If you take any of these medications: Glipizide/Metformin, Avandament, Glucavance, please do not take 48 hours after completing test unless otherwise instructed.   Once we have confirmed authorization from your insurance company, we will call you to set up a date and time for your test. Based on how quickly your insurance processes prior authorizations requests, please allow up to 4 weeks to be contacted for scheduling your Cardiac CT appointment. Be advised that routine Cardiac CT appointments could be scheduled as many as 8 weeks after your provider has ordered it.  For non-scheduling related questions, please contact the cardiac imaging nurse navigator should you have any questions/concerns: Marchia Bond, Cardiac Imaging Nurse Navigator Gordy Clement, Cardiac Imaging Nurse Navigator [facility] Direct Office Dial: [phone]   For scheduling needs, including cancellations and rescheduling, please call [place], [phone].     Follow-Up: At [facility], you and your  health needs are our priority.  As part of our continuing mission to provide you with exceptional heart care, we have created designated Provider Care Teams.  These Care Teams include your primary Cardiologist (physician) and Advanced Practice Providers (APPs -  Physician Assistants and Nurse Practitioners) who all work together to provide  you with the care you need, when you need it.  We recommend signing up for the patient portal called "MyChart".  Sign up information is provided on this After Visit Summary.  MyChart is used to connect with patients for Virtual Visits (Telemedicine).  Patients are able to view lab/test results, encounter notes, upcoming appointments, etc.  Non-urgent messages can be sent to your provider as well.   To learn more about what you can do with MyChart, go to [email or web address].    Your next appointment:   4 week(s) (after cardiac CT)   The format for your next appointment:   In Person  Provider:   You may see Ida Rogue, MD or one of the following Advanced Practice Providers on your designated Care Team:     Marrianne Mood, [place]

## 2021-01-30 ENCOUNTER — Telehealth: Payer: Self-pay | Admitting: *Deleted

## 2021-01-30 DIAGNOSIS — Z79899 Other long term (current) drug therapy: Secondary | ICD-10-CM

## 2021-01-30 DIAGNOSIS — R0600 Dyspnea, unspecified: Secondary | ICD-10-CM

## 2021-01-30 DIAGNOSIS — I10 Essential (primary) hypertension: Secondary | ICD-10-CM

## 2021-01-30 DIAGNOSIS — R0609 Other forms of dyspnea: Secondary | ICD-10-CM

## 2021-01-30 LAB — BASIC METABOLIC PANEL
BUN/Creatinine Ratio: 15 (ref 10–24)
BUN: 21 mg/dL (ref 8–27)
CO2: 22 mmol/L (ref 20–29)
Calcium: 9.2 mg/dL (ref 8.6–10.2)
Chloride: 100 mmol/L (ref 96–106)
Creatinine, Ser: 1.42 mg/dL — ABNORMAL HIGH (ref 0.76–1.27)
Glucose: 92 mg/dL (ref 65–99)
Potassium: 4 mmol/L (ref 3.5–5.2)
Sodium: 139 mmol/L (ref 134–144)
eGFR: 55 mL/min/{1.73_m2} — ABNORMAL LOW (ref >59)

## 2021-01-30 NOTE — Telephone Encounter (Signed)
-----   Message from Arvil Chaco, PA-C sent at 01/30/2021  1:36 PM EDT ----- Baseline kidney function for coronary CTA. While his kidney function is unchanged from previous labs, I would like to keep a closer eye on it with any contrast exposure.  Please ensure that he still drinks some water (salt under 2g, fluid under 2L). Let's recheck his kidney function again when he comes back from his trip - I hope he has a great time!

## 2021-01-30 NOTE — Telephone Encounter (Signed)
Called pt and clarified that per [facility], will need BMET prior to CT.  So when he returns from his trip he may go to the [place] to have BMET- reviewed instructions to have labs at this location.  Pt verbalized understanding and has no further questions at this time. Lab orders placed.

## 2021-01-30 NOTE — Telephone Encounter (Signed)
Attempted to call pt. No answer. Lmtcb.  

## 2021-01-30 NOTE — Telephone Encounter (Signed)
-----   Message from Arvil Chaco, PA-C sent at 01/30/2021  3:43 PM EDT ----- Prior to the cardiac CT - apologies!  I may hold his valsartan if needed were my thoughts behind that request.  ----- Message ----- From: Solmon Ice, RN Sent: 01/30/2021   3:32 PM EDT To: Arvil Chaco, PA-C  The patient has been notified of the result and verbalized understanding.  I wanted to clarify, do you want the labs repeated prior to cardiac CT or afterward due to contrast exposure?

## 2021-01-31 ENCOUNTER — Other Ambulatory Visit: Payer: Self-pay | Admitting: Internal Medicine

## 2021-02-06 ENCOUNTER — Other Ambulatory Visit
Admission: RE | Admit: 2021-02-06 | Discharge: 2021-02-06 | Disposition: A | Discharge status: Home / Self Care | Payer: Medicare Other | Source: Ambulatory Visit | Attending: Physician Assistant | Admitting: Physician Assistant

## 2021-02-06 DIAGNOSIS — I10 Essential (primary) hypertension: Secondary | ICD-10-CM

## 2021-02-06 DIAGNOSIS — R0600 Dyspnea, unspecified: Secondary | ICD-10-CM | POA: Insufficient documentation

## 2021-02-06 DIAGNOSIS — R0609 Other forms of dyspnea: Secondary | ICD-10-CM

## 2021-02-06 DIAGNOSIS — Z79899 Other long term (current) drug therapy: Secondary | ICD-10-CM

## 2021-02-06 LAB — BASIC METABOLIC PANEL
Anion gap: 9 (ref 5–15)
BUN: 19 mg/dL (ref 8–23)
CO2: 25 mmol/L (ref 22–32)
Calcium: 8.6 mg/dL — ABNORMAL LOW (ref 8.9–10.3)
Chloride: 101 mmol/L (ref 98–111)
Creatinine, Ser: 1.27 mg/dL — ABNORMAL HIGH (ref 0.61–1.24)
GFR, Estimated: >60 mL/min (ref >60)
Glucose, Bld: 165 mg/dL — ABNORMAL HIGH (ref 70–99)
Potassium: 3.6 mmol/L (ref 3.5–5.1)
Sodium: 135 mmol/L (ref 135–145)

## 2021-02-11 ENCOUNTER — Telehealth (HOSPITAL_COMMUNITY): Payer: Self-pay | Admitting: Emergency Medicine

## 2021-02-11 NOTE — Telephone Encounter (Signed)
Reaching out to patient to offer assistance regarding upcoming cardiac imaging study; pt verbalizes understanding of appt date/time, parking situation and where to check in, pre-test NPO status and medications ordered, and verified current allergies; name and call back number provided for further questions should they arise Marchia Bond RN [facility] [phone] office [phone] cell  Pt asked to avoid revatio  Will take 100mg  metoprolol tartrate Clarise Cruz

## 2021-02-12 ENCOUNTER — Telehealth (HOSPITAL_COMMUNITY): Payer: Self-pay | Admitting: Emergency Medicine

## 2021-02-12 ENCOUNTER — Other Ambulatory Visit: Payer: Self-pay

## 2021-02-12 ENCOUNTER — Ambulatory Visit
Admission: RE | Admit: 2021-02-12 | Discharge: 2021-02-12 | Disposition: A | Discharge status: Home / Self Care | Payer: Medicare Other | Source: Ambulatory Visit | Attending: Physician Assistant | Admitting: Physician Assistant

## 2021-02-12 DIAGNOSIS — R0600 Dyspnea, unspecified: Secondary | ICD-10-CM

## 2021-02-12 DIAGNOSIS — R9430 Abnormal result of cardiovascular function study, unspecified: Secondary | ICD-10-CM | POA: Diagnosis present

## 2021-02-12 DIAGNOSIS — R0609 Other forms of dyspnea: Secondary | ICD-10-CM

## 2021-02-12 DIAGNOSIS — R931 Abnormal findings on diagnostic imaging of heart and coronary circulation: Secondary | ICD-10-CM

## 2021-02-12 DIAGNOSIS — I2510 Atherosclerotic heart disease of native coronary artery without angina pectoris: Secondary | ICD-10-CM | POA: Diagnosis not present

## 2021-02-12 MED ORDER — METOPROLOL TARTRATE 5 MG/5ML IV SOLN
10.0000 mg | Freq: Once | INTRAVENOUS | Status: AC
  Administered 2021-02-12: 10 mg via INTRAVENOUS

## 2021-02-12 MED ORDER — NITROGLYCERIN 0.4 MG SL SUBL
0.8000 mg | SUBLINGUAL_TABLET | Freq: Once | SUBLINGUAL | Status: AC
  Administered 2021-02-12: 0.8 mg via SUBLINGUAL

## 2021-02-12 MED ORDER — IOHEXOL 350 MG/ML SOLN
100.0000 mL | Freq: Once | INTRAVENOUS | Status: AC | PRN
  Administered 2021-02-12: 100 mL via INTRAVENOUS

## 2021-02-12 NOTE — Progress Notes (Signed)
Patient tolerated procedure well. Ambulate w/o difficulty. Sitting in chair drinking water provided. Encouraged to drink extra water today and reasoning explained. Verbalized understanding. All questions answered. ABC intact. No further needs. Discharge from procedure area w/o issues.  

## 2021-02-12 NOTE — Telephone Encounter (Signed)
Pt calling to clarify medications to take prior to the scan.  Pt states he drank a cup of decaf coffee and requesting to take a dose of robaxin for pain   Reminded to take metop 100mg  2 hr prior to scan  [facility] [phone] Office  [phone] Cell

## 2021-02-22 ENCOUNTER — Other Ambulatory Visit: Payer: Self-pay | Admitting: Family Medicine

## 2021-02-22 NOTE — Telephone Encounter (Signed)
Requested Prescriptions  Pending Prescriptions Disp Refills  . hydrochlorothiazide (HYDRODIURIL) 25 MG tablet [Pharmacy Med Name: hydroCHLOROthiazide 25 MG TABLET] 90 tablet 0    Sig: TAKE ONE TABLET BY MOUTH DAILY     Cardiovascular: Diuretics - Thiazide Failed - 02/22/2021  6:52 AM      Failed - Ca in normal range and within 360 days    Calcium  Date Value Ref Range Status  02/06/2021 8.6 (L) 8.9 - 10.3 mg/dL Final   Calcium, Total  Date Value Ref Range Status  07/18/2014 8.5 8.5 - 10.1 mg/dL Final         Failed - Cr in normal range and within 360 days    Creatinine  Date Value Ref Range Status  07/18/2014 1.05 0.60 - 1.30 mg/dL Final   Creatinine, Ser  Date Value Ref Range Status  02/06/2021 1.27 (H) 0.61 - 1.24 mg/dL Final   Creatinine,U  Date Value Ref Range Status  08/17/2017 95.3 mg/dL Final         Passed - K in normal range and within 360 days    Potassium  Date Value Ref Range Status  02/06/2021 3.6 3.5 - 5.1 mmol/L Final  07/18/2014 4.4 3.5 - 5.1 mmol/L Final         Passed - Na in normal range and within 360 days    Sodium  Date Value Ref Range Status  02/06/2021 135 135 - 145 mmol/L Final  01/29/2021 139 134 - 144 mmol/L Final  07/18/2014 138 136 - 145 mmol/L Final         Passed - Last BP in normal range    BP Readings from Last 1 Encounters:  02/12/21 130/78         Passed - Valid encounter within last 6 months    Recent Outpatient Visits          4 months ago Essential hypertension, benign   [facility] Jerrol Banana., MD   6 months ago COVID-19 virus infection   [facility] Jerrol Banana., MD   10 months ago Essential hypertension, benign   [facility] Jerrol Banana., MD   11 months ago [facility] to [employer] preventive visit   [facility] Jerrol Banana., MD   1 year ago Essential hypertension, benign   [facility] Jerrol Banana., MD      Future Appointments            In 4 days Arvil Chaco, PA-C [facility], [id number]

## 2021-02-26 ENCOUNTER — Ambulatory Visit: Payer: BLUE CROSS/BLUE SHIELD | Admitting: Physician Assistant

## 2021-03-04 ENCOUNTER — Telehealth: Payer: Self-pay

## 2021-03-04 NOTE — Telephone Encounter (Signed)
Please review. Thanks!  

## 2021-03-04 NOTE — Telephone Encounter (Signed)
Patient is having "nerve issues" with his back and is seeing neurology and orthopedics.   He is asking for refills on the Xanax Dr. Derrel Nip prescribed orginially so he can sleep.   She gave him .25 mg but he has been taking [facility]'s rx of .50 mg.   He is asking for the .50 mg. To be sent to [facility]. Please let him know if this is going to be done.

## 2021-03-05 ENCOUNTER — Ambulatory Visit: Payer: Self-pay | Admitting: Family Medicine

## 2021-03-06 ENCOUNTER — Other Ambulatory Visit: Payer: Self-pay

## 2021-03-06 ENCOUNTER — Encounter: Payer: Self-pay | Admitting: Physician Assistant

## 2021-03-06 ENCOUNTER — Ambulatory Visit (INDEPENDENT_AMBULATORY_CARE_PROVIDER_SITE_OTHER): Payer: Medicare Other | Admitting: Physician Assistant

## 2021-03-06 VITALS — BP 128/80 | HR 92 | Ht 70.0 in | Wt 229.0 lb

## 2021-03-06 DIAGNOSIS — E785 Hyperlipidemia, unspecified: Secondary | ICD-10-CM

## 2021-03-06 DIAGNOSIS — Z79899 Other long term (current) drug therapy: Secondary | ICD-10-CM

## 2021-03-06 DIAGNOSIS — R931 Abnormal findings on diagnostic imaging of heart and coronary circulation: Secondary | ICD-10-CM | POA: Diagnosis not present

## 2021-03-06 DIAGNOSIS — Z87891 Personal history of nicotine dependence: Secondary | ICD-10-CM

## 2021-03-06 DIAGNOSIS — I7090 Unspecified atherosclerosis: Secondary | ICD-10-CM

## 2021-03-06 DIAGNOSIS — Z789 Other specified health status: Secondary | ICD-10-CM

## 2021-03-06 DIAGNOSIS — I10 Essential (primary) hypertension: Secondary | ICD-10-CM | POA: Diagnosis not present

## 2021-03-06 DIAGNOSIS — K219 Gastro-esophageal reflux disease without esophagitis: Secondary | ICD-10-CM

## 2021-03-06 MED ORDER — TORSEMIDE 10 MG PO TABS: 10.0000 mg | ORAL_TABLET | Freq: Every day | ORAL | 3 refills | Status: DC

## 2021-03-06 NOTE — Progress Notes (Addendum)
.    Office Visit    Patient Name: Dustin [name] Date of Encounter: 03/06/2021  PCP:  Jerrol Banana., MD   [facility]  Cardiologist:  Ida Rogue, MD  Advanced Practice Provider:  Arvil Chaco, PA-C Electrophysiologist:  None :[phone]}   Chief Complaint    Chief Complaint  Patient presents with  . Follow-up    4 Week follow up and CT results. Medications verbally reviewed with patient.     65 yo with history of HTN, testicular cancer treated with chemo, pituitary adenoma with residual weakness, former smoker (quit tobacco 2010),  GERD,and who is seen today for follow-up of coronary CT with abnormal coronary artery calcium score and to discuss proceeding with catheterization if agreeable.  Past Medical History    Past Medical History:  Diagnosis Date  . Arthritis   . Family history of cerebral aneurysm   . History of colonic polyps   . History of COVID-19 07/2020  . Hypertension   . 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 ABDOMINAL EXPLORATION SURGERY  1978  . APPENDECTOMY     at same time as lymph node dissection  . ESOPHAGOGASTRODUODENOSCOPY  03/08    and colon  . INGUINAL HERNIA REPAIR  child   bilateral  . JOINT REPLACEMENT    . MOHS SURGERY Left nose   at [facility]--- [facility]  . prolactinoma excision  09/04   [name]  . right testicle removed  1978  . SHOULDER OPEN ROTATOR CUFF REPAIR Left 6/17   and biceps repair--[facility]  . stress nuclear  negative   10/09  . TONSILLECTOMY      Allergies  Allergies  Allergen Reactions  . Amlodipine Swelling    LE EDEMA  . Varenicline Tartrate     History of Present Illness    Dustin [name] is a 65 y.o. male with PMH as above.  Dad had HBP and cerebral aneurysm. PMH includes testicular cancer s/p chemotherapy, pituitary adenoma, previous tobacco use (65yo-20s with more  smoking in 20s), GERD, and elevated BP/hypertension.  He is seen today for follow-up of elevated blood pressure.  On 11/20/2020, he underwent cardiac CT/coronary artery calcium scoring with CAC score of 138, placing him in the 61st percentile for age and sex matched control.  Recommendation was for ASA and statin if no contraindications.  Seen 01/15/2021 with clinic BP well controlled.  He reported max SBP 165 and DBP 12 and min SBP 107 with DBP 70.  He reported feeling his best at SBP 140s and weak/presyncope at SBP low 100s.  He had a left-sided pinpoint headache when BP elevated, usually prompting him to come in and rest.  His wife had a high salt diet, though he was attempting to have a low salt diet himself.  Statin discussed with patient preference to avoid due to poor tolerance in the past.  PCSK9 inhibitors discussed but deferred Zetia discussed and started after valsartan 160 mg for further BP control.  He indicated a desire to consolidate medications, if possible.  He did discuss some abdominal bloating, at times with food.  Reassessment of HCTZ versus torsemide to current RTC.  Seen 01/29/2021 and tolerating Zetia, as well as valsartan.  He noted shortness of breath with activity, such as walking approximately 100 yards.  Dyspnea has been stable for some time, worse during fast or  quick activity.  He wondered the extent to which his sedentary lifestyle was contributing to his dyspnea.  He also worried about experiencing a coronary event, given the amount of time he spent away from any [facility] or hospital.  He reported some element of anxiety is contributing to his breathing at times.  He was on his way to [place] for a boating trip.  Coronary CTA was performed and showed aortic atherosclerosis.  In addition, a 9 calcified plaque causing moderate stenosis at 50% was noted in the proximal RCA with FFR 0.9.  A calcified plaque, causing moderate stenosis was noted in the mid left circumflex with  FFR 0.89.  The LAD FFR was 0.85.  Grade 2 atheroma was located in the ascending and descending aorta.  Overall, CAD RADS 3 for moderate stenosis.  Recommendation was for anti-ischemic pharmacotherapy as well as risk factor modification per GDMT.  Recommendation based on these findings was to reassess symptoms at RTC and consider catheterization if ongoing symptoms at that time.  Today, 03/06/2021, he returns to clinic to discuss his coronary CTA.  He reports significant pain in his left shoulder due to pinched nerve, as well as recently discovered degenerative disc disease.  He continues to feel short of breath but wonders how much anxiety contributes to this feeling.  He also notes that he is significantly deconditioned, given that he limits himself in activity.  He reports periodically having night sweats.  Given his current back problems, he is unable to differentiate his back problems due to his spine issues with those unrelated.  He continues to note abdominal distention and bloating and wonders if he is holding onto fluid in his abdomen.  No lower extremity edema.  No other symptoms of volume overload.  No signs or symptoms of bleeding.  We discussed proceeding with cardiac catheterization with patient preference at this time to see if diuresis improves his shortness of breath and proceed to cardiac catheterization if no relief at that time, which is reasonable with close follow-up and strict return precautions.  Home Medications    Current Outpatient Medications on File Prior to Visit  Medication Sig Dispense Refill  . buPROPion (WELLBUTRIN XL) 300 MG 24 hr tablet Take 1 tablet (300 mg total) by mouth daily. 90 tablet 3  . cholecalciferol (VITAMIN D3) 25 MCG (1000 UNIT) tablet Take 1,000 Units by mouth daily.    Marland Kitchen ezetimibe (ZETIA) 10 MG tablet Take 1 tablet (10 mg total) by mouth daily. 30 tablet 5  . fluconazole (DIFLUCAN) 200 MG tablet Take 200 mg by mouth once a week.    . gabapentin  (NEURONTIN) 300 MG capsule Take 1 capsule by mouth 3 (three) times daily.    . meloxicam (MOBIC) 15 MG tablet Take 1 tablet by mouth daily.    Marland Kitchen omeprazole (PRILOSEC) 40 MG capsule TAKE ONE CAPSULE BY MOUTH TWICE A DAY 180 capsule 0  . sildenafil (REVATIO) 20 MG tablet TAKE 1 TABLET BY MOUTH THREE TIMES DAILY 90 tablet 0  . traMADol (ULTRAM) 50 MG tablet Take by mouth.    . valsartan (DIOVAN) 160 MG tablet Take 1 tablet (160 mg total) by mouth daily. 30 tablet 5  . metoprolol tartrate (LOPRESSOR) 100 MG tablet Take 1 tablet (100 mg total) by mouth once for 1 dose. Take TWO hours prior to CT procedure 1 tablet 0   No current facility-administered medications on file prior to visit.    Review of Systems    He reports  ongoing dyspnea with exertion and chest discomfort/back pain with recent spinal issues.  He reports intermittent bloating.  He denies palpitations, pnd, orthopnea, n, v, dizziness, syncope, edema, weight gain, or early satiety.   All other systems reviewed and are otherwise negative except as noted above.  Physical Exam    VS:  BP 128/80 (BP Location: Right Arm, Patient Position: Sitting, Cuff Size: Large)   Pulse 92   Ht 5\' 10"  (1.778 m)   Wt 229 lb (103.9 kg)   SpO2 95%   BMI 32.86 kg/m  , BMI Body mass index is 32.86 kg/m. GEN: Well nourished, well developed, in no acute distress.  Facemask in place. HEENT: normal. Neck: Supple, no JVD, carotid bruits, or masses. Cardiac: RRR, no murmurs, rubs, or gallops. No clubbing, cyanosis, no pitting edema.  Radials/DP/PT 2+ and equal bilaterally.  Respiratory:  Respirations regular and unlabored, clear to auscultation bilaterally. GI: Distended, nontender, BS + x 4 MS: No deformities noted Skin: warm and dry, no rash. Neuro:  Strength and sensation are intact. Psych: Normal affect.  Accessory Clinical Findings    ECG personally reviewed by me today -NSR, 92 bpm, PR interval 126, QRS 94, poor R wave progression [name], aVF as in  previous no acute changes from previous.  VITALS Reviewed today   Temp Readings from Last 3 Encounters:  10/15/20 98.3 F (36.8 C) (Oral)  07/29/20 98.1 F (36.7 C) (Oral)  04/22/20 (!) 97.3 F (36.3 C) (Other (Comment))   BP Readings from Last 3 Encounters:  03/06/21 128/80  02/12/21 130/78  01/29/21 120/84   Pulse Readings from Last 3 Encounters:  03/06/21 92  02/12/21 62  01/29/21 75    Wt Readings from Last 3 Encounters:  03/06/21 229 lb (103.9 kg)  01/29/21 228 lb (103.4 kg)  01/15/21 232 lb (105.2 kg)     LABS  reviewed today   Lab Results  Component Value Date   WBC 6.3 04/22/2020   HGB 17.8 (H) 04/22/2020   HCT 52.1 (H) 04/22/2020   MCV 95 04/22/2020   PLT 171 04/22/2020   Lab Results  Component Value Date   CREATININE 1.27 (H) 02/06/2021   BUN 19 02/06/2021   NA 135 02/06/2021   K 3.6 02/06/2021   CL 101 02/06/2021   CO2 25 02/06/2021   Lab Results  Component Value Date   ALT 22 04/22/2020   AST 19 04/22/2020   ALKPHOS 85 04/22/2020   BILITOT 0.4 04/22/2020   Lab Results  Component Value Date   CHOL 201 (H) 04/22/2020   HDL 36 (L) 04/22/2020   LDLCALC 130 (H) 04/22/2020   LDLDIRECT 129.0 05/10/2019   TRIG 196 (H) 04/22/2020   CHOLHDL 5.6 (H) 04/22/2020    No results found for: HGBA1C Lab Results  Component Value Date   TSH 1.410 04/22/2020     STUDIES/PROCEDURES reviewed today   cCTA IMPRESSION: 1. Coronary calcium score previously calculated. 2. Normal coronary origin with right dominance. 3. Non calcified plaque causing moderate stenosis (50%) in the proximal RCA. 4. Calcified plaque causing moderate stenosis in the mid LCx. 5. Grade II atheroma in the ascending and descending aorta 6. CAD-RADS 3. Moderate stenosis. Consider symptom-guided anti-ischemic pharmacotherapy as well as risk factor modification per guideline directed care. 1. Left Main:  No significant stenosis. 2. LAD: No significant stenosis.  FFRct 0.85 3.  LCX: No significant stenosis.  FFRct 0.89 4. RCA: No significant stenosis.  FFRct 0.9  Echo 2018 - Left ventricle:  The cavity size was normal. Wall thickness was  normal. Systolic function was vigorous. The estimated ejection  fraction was in the range of 65% to 70%. Wall motion was normal;  there were no regional wall motion abnormalities. Left  ventricular diastolic function parameters were normal for the  patient&'s age.   CAC Score 11/20/2020 IMPRESSION: 1. Coronary calcium score of 138. This was 61st percentile for age and sex matched control. 2.  Recommend asa and statin if no contraindications. LM 0  LAD 0  LCx 138  RCA 0  Total 138  Assessment & Plan    Coronary artery calcium score 138 Moderate stenosis of pRCA and mLCx by cCTA (FFR 0.85LAD, 0.89LCx, 0.9RCA) Dyspnea with concern for anginal equivalent --Ongoing.  Recent CAC score 138, placing him in the 61st percentile for age and sex matched control with recommendation for ASA and statin if no contraindication.  Most recent coronary CTA shows noncalcified plaque with moderate stenosis (50%) in the proximal RCA, calcified plaque causing moderate stenosis in the mid left circumflex, and grade 2 atheroma in the ascending and descending aorta.  Subsequent FFR shows 0.85 for the LAD 0.89 to the left circumflex, and 0.9 for the RCA.  We discussed concern for dyspnea as anginal equivalent today, especially given his recent coronary CTA findings and ongoing symptoms.  Recommendation to proceed with cardiac catheterization for definitive assessment of his vessels was discussed with patient preference to defer and first attempt transition from HCTZ to torsemide to see if improvement in dyspnea which is reasonable with close follow-up and strict return precautions.  We did discuss he does have risk factors for CAD and subsequent coronary insufficiency, including poorly controlled LDL.  Started on Zetia at his previous  clinic visit as above with recommendation to repeat today and switch to Praluent or Repatha if LDL still elevated at that time.  Also considered is in concern or addition of Vascepa.  We will recheck his lipids today.  Given his moderate stenosis of his coronary arteries with FFR as above, it was discussed that we will have a low threshold to proceed to catheterization moving forward with ongoing symptoms and if not improved following escalation of GDMT as outlined directly above.  Aggressive risk factor modification recommended -will work towards getting on PCSK9 inhibitors.  Continue ASA 81 mg daily.  At RTC, consider addition of metoprolol if rate remains elevated and further room in BP after switching to torsemide. Return precautions discussed.  HTN, BP goal below 130/80 --BP today borderline.  Recently transitioned from losartan to valsartan 160 mg daily, and as this valsartan dose is the equivalent dose for previous losartan, BP today relatively unchanged from previous.  In the setting of his recent pain, he has not been checking blood pressure at home.  We will transition him from HCTZ to torsemide at this time, given his most recent renal function shows CKD and HCTZ is not recommended in the setting.  After transitioning to a loop diuretic, reassess BP at RTC.  If still elevated at that time, recommend addition of beta-blocker.  BMET today to recheck renal function electrolytes.  Recommend BMET at RTC.  Reviewed salt, fluid intake.  HLD, LDL goal below 70 Statin intolerance --04/2020 cholesterol 201 with LDL 130.  Reports intolerance to statins with myalgias, as previously noted.  Started on Zetia at his previous visit with patient tolerating this medication well.  Recheck lipid and liver function today and if LDL not well controlled, PCSK9 inhibitor/ Incliseran  for optimized LDL to control.   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  LDL has not been  well controlled with patient agreeable to recheck LDL today, and if not well controlled, consider addition of PCSK9 inhibitors for LDL control moving forward and aggressive risk factor modification.  Continue Zetia for now.  Goal LDL below 70.  Former smoker --Tobacco cessation in 2010.  Ongoing cessation recommended.   GERD --Continue PPI, taken as needed.   Medication changes: Discontinue HCTZ.  Start torsemide 10 mg daily. Labs ordered: BMET now, BMET at RTC.  LDL/LFTs now Studies / Imaging ordered: None, patient preference to defer catheterization at this time and reassess after addition of loop diuretic/escalation of GDMT Future considerations: Cardiac catheterization if ongoing dyspnea following start of torsemide and escalation of GDMT, escalation of loop diuretic, escalation of GDMT beta-blocker Disposition: RTC 1 month or sooner if indicated   Arvil Chaco, PA-C 03/06/2021

## 2021-03-06 NOTE — Patient Instructions (Addendum)
Medication Instructions:  Your physician has recommended you make the following change in your medication:   STOP HCTZ (Hydrochlorothiazide)  START Torsemide 10mg  DAILY  *If you need a refill on your cardiac medications before your next appointment, please call your pharmacy*   Lab Work:  Your physician recommends that you have labs TODAY: CMET, Lipid panel   Testing/Procedures: None ordered   Follow-Up: At [facility], you and your health needs are our priority.  As part of our continuing mission to provide you with exceptional heart care, we have created designated Provider Care Teams.  These Care Teams include your primary Cardiologist (physician) and Advanced Practice Providers (APPs -  Physician Assistants and Nurse Practitioners) who all work together to provide you with the care you need, when you need it.  We recommend signing up for the patient portal called "MyChart".  Sign up information is provided on this After Visit Summary.  MyChart is used to connect with patients for Virtual Visits (Telemedicine).  Patients are able to view lab/test results, encounter notes, upcoming appointments, etc.  Non-urgent messages can be sent to your provider as well.   To learn more about what you can do with MyChart, go to [email or web address].    Your next appointment:   1 month(s)  The format for your next appointment:   In Person  Provider:   You may see Ida Rogue, MD or one of the following Advanced Practice Providers on your designated Care Team:     Marrianne Mood, [place]   Other Instructions:  CALL THE OFFICE IF:  If any chest pain or pressure, or if your shortness of breath worsens or starts to occur at rest.  CALL THE OFFICE IF Any worsening symptoms.BP consistently over 130/80

## 2021-03-07 LAB — COMPREHENSIVE METABOLIC PANEL
ALT: 40 IU/L (ref 0–44)
AST: 30 IU/L (ref 0–40)
Albumin/Globulin Ratio: 2.3 — ABNORMAL HIGH (ref 1.2–2.2)
Albumin: 4.3 g/dL (ref 3.8–4.8)
Alkaline Phosphatase: 82 IU/L (ref 44–121)
BUN/Creatinine Ratio: 13 (ref 10–24)
BUN: 18 mg/dL (ref 8–27)
Bilirubin Total: 0.3 mg/dL (ref 0.0–1.2)
CO2: 25 mmol/L (ref 20–29)
Calcium: 9.2 mg/dL (ref 8.6–10.2)
Chloride: 102 mmol/L (ref 96–106)
Creatinine, Ser: 1.43 mg/dL — ABNORMAL HIGH (ref 0.76–1.27)
Globulin, Total: 1.9 g/dL (ref 1.5–4.5)
Glucose: 82 mg/dL (ref 65–99)
Potassium: 4 mmol/L (ref 3.5–5.2)
Sodium: 141 mmol/L (ref 134–144)
Total Protein: 6.2 g/dL (ref 6.0–8.5)
eGFR: 54 mL/min/{1.73_m2} — ABNORMAL LOW (ref >59)

## 2021-03-07 LAB — LIPID PANEL
Chol/HDL Ratio: 4.6 ratio (ref 0.0–5.0)
Cholesterol, Total: 208 mg/dL — ABNORMAL HIGH (ref 100–199)
HDL: 45 mg/dL (ref >39)
LDL Chol Calc (NIH): 117 mg/dL — ABNORMAL HIGH (ref 0–99)
Triglycerides: 262 mg/dL — ABNORMAL HIGH (ref 0–149)
VLDL Cholesterol Cal: 46 mg/dL — ABNORMAL HIGH (ref 5–40)

## 2021-03-10 ENCOUNTER — Telehealth: Payer: Self-pay

## 2021-03-10 ENCOUNTER — Telehealth: Payer: Self-pay | Admitting: Cardiovascular Disease

## 2021-03-10 ENCOUNTER — Encounter: Payer: Self-pay | Admitting: *Deleted

## 2021-03-10 DIAGNOSIS — I10 Essential (primary) hypertension: Secondary | ICD-10-CM

## 2021-03-10 DIAGNOSIS — Z79899 Other long term (current) drug therapy: Secondary | ICD-10-CM

## 2021-03-10 DIAGNOSIS — E785 Hyperlipidemia, unspecified: Secondary | ICD-10-CM

## 2021-03-10 DIAGNOSIS — Z789 Other specified health status: Secondary | ICD-10-CM

## 2021-03-10 MED ORDER — REPATHA SURECLICK 140 MG/ML ~~LOC~~ SOAJ: 1.0000 mL | SUBCUTANEOUS | 11 refills | Status: DC

## 2021-03-10 NOTE — Telephone Encounter (Signed)
PA for Repatha was denied.  Preferred is Praluent.

## 2021-03-10 NOTE — Telephone Encounter (Signed)
Sent pt MyChart message advising okay to bring in his personal BP machine for comparison at his next visit in June

## 2021-03-10 NOTE — Telephone Encounter (Signed)
Spoke to pt. Notified of results and provider's recc.  Pt voiced understanding. He will: - [facility]-2 [facility] - START Repatha - Repeat FASTING lipid/liver panel at [facility] in 6-8 weeks  Rx sent to [place]. Pt aware to contact office with any cost issue w/ Rx.  Lab orders placed.   Pt also reports continued elevated BP >130/80 since ov on 03/06/21. Recently transitioned from losartan to valsartan 160 mg daily.  Pt does report concern of accuracy of home BP cuff.  Pt takes Valsartan in evening. He gives last few days BP readings below:   Friday 5/27 (2 hours after leaving our office) BP 146/95 HR 98   -- In our office BP was 120/80 HR 94 (pt concerned with his home cuff accuracy) Sat AM 5/28 - BP 165/91 Sun AM 5/29 - BP 156/90 Mon AM 5/30 - BP 126/80 This morning @ 0749 5/31 BP 156/89 HR 84  Pt does plan to bring his BP cuff to next appt so we can check it alongside manual BP.  Will make Jacquelyn aware as well.

## 2021-03-10 NOTE — Telephone Encounter (Signed)
-----   Message from Arvil Chaco, [place] sent at 03/09/2021  8:17 AM EDT ----- Labs show --Renal function at the upper limits of his normal range over the last year. Will want to monitor closely with a repeat BMET within 1-2 weeks, rather than at RTC. --LDL still elevated  As discussed in clinic, recommend start of [place] or [place] at this time and given LDL is still uncontrolled on Zetia with statin intolerance. Please start him on one of these two options with repeat lipid and liver function in 6-8 weeks. Given his CAC on CT, we will want his LDL much closer to goal.

## 2021-03-10 NOTE — Telephone Encounter (Signed)
This encounter was created in error - please disregard.

## 2021-03-10 NOTE — Telephone Encounter (Signed)
Patient would like to know if he can bring in his BP monitor and compare with the one in our office. Please call to discuss.

## 2021-03-10 NOTE — Telephone Encounter (Signed)
Patient would like to discuss an alternative for Repatha due to the cost.

## 2021-03-10 NOTE — Telephone Encounter (Signed)
Please see subsequent phone message regarding prior auth in process for Repatha. Will wait for result of PA determination prior to changing Rx. Will call pt to update.

## 2021-03-10 NOTE — Telephone Encounter (Signed)
Has his wt also been increasing? If so, we will increase the torsemide. At his last office visit, we changed his HCTZ to torsemide, and at that time, I told him to let me know if increasing wt and BP between visits.

## 2021-03-10 NOTE — Telephone Encounter (Addendum)
PA started through covermymeds  Dustin [name] (Key: [facility]) Rx #: [id number] Repatha SureClick 140MG /ML auto-injectors  Your information has been submitted to [place] [employer] Part D. [employer] will review the request and will issue a decision, typically within 1-3 days from your submission. You can check the updated outcome later by reopening this request.  If [employer] has not responded in 1-3 days or if you have any questions about your ePA request, please contact [place] Medicare Part D at [phone]. If you think there may be a problem with your PA request, use our live chat feature at the bottom right. Wait for Determination  Please wait for [employer] NCPDP 2017 to return a determination.

## 2021-03-10 NOTE — Telephone Encounter (Signed)
Attempted to call pt back. Left detailed message on vm, ok per DPR.  Notified pt that we can schedule a nurse visit tomorrow (or Thursday) this week to check manual BP with his home BP cuff if he will bring with him.  Notified pt since I am calling at [place] today, he may either send MyChart message or call us back in the morning to schedule nurse visit. Will also discuss Jacquelyn recc below on return call.

## 2021-03-11 ENCOUNTER — Telehealth: Payer: Self-pay

## 2021-03-11 ENCOUNTER — Telehealth: Payer: Self-pay | Admitting: Cardiovascular Disease

## 2021-03-11 ENCOUNTER — Other Ambulatory Visit: Payer: Self-pay

## 2021-03-11 ENCOUNTER — Ambulatory Visit: Payer: Medicare Other | Admitting: *Deleted

## 2021-03-11 VITALS — BP 184/111 | HR 80 | Ht 70.0 in | Wt 228.0 lb

## 2021-03-11 DIAGNOSIS — I10 Essential (primary) hypertension: Secondary | ICD-10-CM

## 2021-03-11 MED ORDER — VALSARTAN 320 MG PO TABS: 320.0000 mg | ORAL_TABLET | Freq: Every day | ORAL | 5 refills | Status: DC

## 2021-03-11 MED ORDER — PRALUENT 150 MG/ML ~~LOC~~ SOAJ: 1.0000 "pen " | SUBCUTANEOUS | 11 refills | Status: AC

## 2021-03-11 NOTE — Telephone Encounter (Signed)
Please see phone note and nurse visit from today 6/1 for further detail.

## 2021-03-11 NOTE — Progress Notes (Signed)
1.) Reason for visit: Evaluate BP, verify home BP cuff accurate  2.) Name of MD requesting visit: Marrianne Mood, PA  3.) H&P: Hypertension   4.) ROS related to problem:   Pt with continued elevated BP at home. BP in last office visit 128/80. Reports BP at home with home cuff continued to be much higher. Pt gave spreadsheet of BP/HR and will have Marrianne Mood, PA review. BP ranging from 130s-160s / 80s-90s. Occasionally SBP170 or 180 and occasionally 120s. HR runs ins 70s-80s. Pt currently takes Valsartan 160mg  daily (pt reports he takes every evening around 9:30 PM). Also recently switched from HCTZ to Torsemide 10mg  daily, every AM. Pt denies wt gain. Weight since starting Torsemide has stayed consistent around 228lbs.   5.) Assessment and plan per MD:   Pt was seen today in nurse visit to evaluate home BP accuracy. Pt reports continued elevated BP at home. Manual BP today was 178/92 HR 81. BP per home cuff was 184/111 HR 80. Notified pt home cuff appears to be accurate. Pt denies HA, blurred vision, or any other change in symptoms at visit. Notified pt will have provider review BP log that was given today and will contact with plan for blood pressure management.

## 2021-03-11 NOTE — Telephone Encounter (Signed)
To Marrianne Mood, PA to review. The patient also came in this morning and had a nurse visit with Jinny Blossom, Therapist, sports.   See notes from that visit in his chart.

## 2021-03-11 NOTE — Telephone Encounter (Signed)
Spoke to pt this morning. He does agree to come in today for a nurse visit so that we can verify his home BP cuff is accurate.  Pt scheduled for nurse visit today at 9 AM. Will discuss with Jacquelyn recc after BP readings are verified.

## 2021-03-11 NOTE — Telephone Encounter (Signed)
Repatha discontinued.  New Rx sent for Praluent.  Pt is aware. No further questions at this time.

## 2021-03-11 NOTE — Telephone Encounter (Signed)
PA started through [place]: [id number]  PA Case ID: [id number] Rx #: [id number]

## 2021-03-11 NOTE — Telephone Encounter (Signed)
Please also see nurse visit from today for further detail.  Spoke with Jacquelyn regarding pt's continued elevated BP.  BP log reviewed.  New orders received for pt to INCREASE Valsartan to 320mg  daily.  May continue to take in the evening as he has been doing.   Called pt to discuss. Left detailed message on vm of the above plan (DPR approved).  Asked pt to call back with any further questions. Also notified pt on vm that if he develops blurred vision, unstable HA, dizziness or becomes unstable with most recent high BP then to go to the ER. Otherwise will proceed with plan above.   Note, Rx for Valsartan dose change ordered in subsequent telephone encounter along with new Rx for Praluent.

## 2021-03-11 NOTE — Addendum Note (Signed)
Addended by: Darlyne Russian on: 03/11/2021 12:23 PM   Modules accepted: Orders

## 2021-03-11 NOTE — Telephone Encounter (Signed)
Pt c/o BP issue: STAT if pt c/o blurred vision, one-sided weakness or slurred speech  1. What are your last 5 BP readings? 198/112  2. Are you having any other symptoms (ex. Dizziness, headache, blurred vision, passed out)? Nausea, tight chest   3. What is your BP issue? High

## 2021-03-11 NOTE — Telephone Encounter (Signed)
Please see subsequent telephone encounter for further detail.

## 2021-03-12 ENCOUNTER — Other Ambulatory Visit: Payer: Self-pay | Admitting: Neurosurgery

## 2021-03-12 DIAGNOSIS — G959 Disease of spinal cord, unspecified: Secondary | ICD-10-CM

## 2021-03-12 DIAGNOSIS — G8929 Other chronic pain: Secondary | ICD-10-CM

## 2021-03-12 DIAGNOSIS — M25512 Pain in left shoulder: Secondary | ICD-10-CM

## 2021-03-12 NOTE — Telephone Encounter (Signed)
Spoke to pt. Printed BP/HR log that pt send since he started incr dose of Valsartan 320mg .  Pt took Valsartan 320mg  last night at 7PM. This AM at 5:52AM BP 189/115 HR 73.  Pt denies blurred vision, dizziness, HA. Did report chest feeling tight last night before he laid down, has resolved.  Denies Shortness of breath.  Advised pt that I am going to discuss BP w/ Jacquelyn today and will call back, however, in the meantime if he develops any s/s above that we discussed to go to ER. Pt voiced understanding.

## 2021-03-12 NOTE — Telephone Encounter (Signed)
Patient calling  States that he sent an email to [facility] and would like for her to review  Please call to discuss

## 2021-03-12 NOTE — Telephone Encounter (Signed)
Made Dr. Rockey Situ aware in clinic. Will give BP log and forward note.  Please also note nurse visit yesterday and last ov with Elenor Quinones, PA on 03/06/21.

## 2021-03-12 NOTE — Telephone Encounter (Addendum)
Spoke to pt. Notified of Dr. Donivan Scull recc.  Pt was in yesterday for nurse visit and it was confirmed that his home cuff is accurate.  Home BP cuff is an upper arm cuff as well.  Pt denies wt gain. Wt is staying consistent at 228 lbs. Last ov 229lbs.  Reviewed importance of staying away from (not only adding salt), but foods already high in sodium including limiting eating out as those foods are higher in sodium.  Pt confirms he has been monitoring sodium intake very well.  Discussed in length with pt anxiety reducing measures.  Pt does have high stress job. Pt is working on measures at home to reduce, but states that anxiety is very likely contributing.  He is going to reach out to PCP to discuss anxiety management.  Pt refuses to start Amlodipine at this time. Pt took in the past and it caused leg swelling.  Pt followed to say that he does not want to start ANY new BP medication at this time.   Pt is going to continue Valsartan 320mg  daily and try to give this new dose a few days to see if his BP regulates.  Pt agrees to only check BP twice daily, and will not continue to check multiple times per day that may also be contributing to high BP.  Pt voiced understanding to contact the office if BP continues to stay elevated and/or any new s/s.  Pt aware to go to the ER if new s/s of blurred vision, dizziness, chest pain or feeling unstable.  Otherwise pt will follow up with Dr. Rockey Situ as scheduled 04/07/21.  Pt has no further questions at this time.

## 2021-03-12 NOTE — Telephone Encounter (Signed)
Interesting his numbers are markedly elevated above what we are getting in office Would make sure his blood pressure cuff is calibrated He can check it with a blood pressure at a pharmacy If old cuff may want to get a new blood pressure cuff,  make sure it is not a wrist cuff a Would do better with a upper arm cuff He is welcome to add additional medication, would make sure anxiety is not pushing it up If weight is trending up that would take His blood pressure Try to avoid salt, eating out that can push up his blood pressure Would add amlodipine 10 mg daily , but would start on a half a pill 5 mg for the first several days, if no improvement will go up to the 10

## 2021-03-12 NOTE — Telephone Encounter (Signed)
Per fax received from [facility], patient has been approved to get Praluent from 10/11/20 through 10/10/21 as long as there are no changes made to the plan. Member ID# [id number]

## 2021-03-16 ENCOUNTER — Other Ambulatory Visit: Payer: Self-pay

## 2021-03-16 ENCOUNTER — Ambulatory Visit
Admission: RE | Admit: 2021-03-16 | Discharge: 2021-03-16 | Disposition: A | Discharge status: Home / Self Care | Payer: Medicare Other | Source: Ambulatory Visit | Attending: Neurosurgery | Admitting: Neurosurgery

## 2021-03-16 DIAGNOSIS — G959 Disease of spinal cord, unspecified: Secondary | ICD-10-CM | POA: Insufficient documentation

## 2021-03-16 DIAGNOSIS — I2510 Atherosclerotic heart disease of native coronary artery without angina pectoris: Secondary | ICD-10-CM | POA: Diagnosis present

## 2021-03-16 MED ORDER — GADOBUTROL 1 MMOL/ML IV SOLN
10.0000 mL | Freq: Once | INTRAVENOUS | Status: AC | PRN
  Administered 2021-03-16: 10 mL via INTRAVENOUS

## 2021-03-18 ENCOUNTER — Ambulatory Visit
Admission: RE | Admit: 2021-03-18 | Discharge: 2021-03-18 | Disposition: A | Discharge status: Home / Self Care | Payer: Medicare Other | Source: Ambulatory Visit | Attending: Neurosurgery | Admitting: Neurosurgery

## 2021-03-18 ENCOUNTER — Other Ambulatory Visit: Payer: Self-pay

## 2021-03-18 DIAGNOSIS — G8929 Other chronic pain: Secondary | ICD-10-CM

## 2021-03-18 MED ORDER — IOPAMIDOL (ISOVUE-M 200) INJECTION 41%
15.0000 mL | Freq: Once | INTRAMUSCULAR | Status: AC
  Administered 2021-03-18: 15 mL via INTRA_ARTICULAR

## 2021-04-07 ENCOUNTER — Ambulatory Visit: Payer: BLUE CROSS/BLUE SHIELD | Admitting: Cardiovascular Disease

## 2021-05-06 DIAGNOSIS — M6281 Muscle weakness (generalized): Secondary | ICD-10-CM | POA: Insufficient documentation

## 2021-05-19 ENCOUNTER — Other Ambulatory Visit: Payer: Self-pay | Admitting: Family Medicine

## 2021-07-06 ENCOUNTER — Ambulatory Visit (INDEPENDENT_AMBULATORY_CARE_PROVIDER_SITE_OTHER): Payer: Medicare Other

## 2021-07-06 DIAGNOSIS — Z0000 Encounter for general adult medical examination without abnormal findings: Secondary | ICD-10-CM | POA: Diagnosis not present

## 2021-07-06 NOTE — Progress Notes (Signed)
Subjective:   Dustin [name] is a 65 y.o. male who presents for Medicare Annual/Subsequent preventive examination.  I connected with  [name] on 07/06/21 by an audio only telemedicine application and verified that I am speaking with the correct person using two identifiers.   I discussed the limitations, risks, security and privacy concerns of performing an evaluation and management service by telephone and the availability of in person appointments. I also discussed with the patient that there may be a patient responsible charge related to this service. The patient expressed understanding and verbally consented to this telephonic visit.  Location of Patient: Home  Location of Provider: [place] participating in Visit: [name] (Patient), Irena Reichmann ([place])  List any persons and their role that are participating in the visit with the patient.    Review of Systems     Defer to ProviderCardiac Risk Factors include: none     Objective:    Today's Vitals   07/06/21 1758  PainSc: 7    There is no height or weight on file to calculate BMI.  Advanced Directives 07/06/2021 09/09/2019 08/02/2018 11/19/2016 04/07/2016  Does Patient Have a Medical Advance Directive? Yes Yes;No Yes Yes No  Type of Advance Directive Living will;Healthcare Power of [place];Living will [place] in Chart? - - No - copy requested - -    Current Medications (verified) Outpatient Encounter Medications as of 07/06/2021  Medication Sig   buPROPion (WELLBUTRIN XL) 300 MG 24 hr tablet Take 1 tablet (300 mg total) by mouth daily.   cholecalciferol (VITAMIN D3) 25 MCG (1000 UNIT) tablet Take 1,000 Units by mouth daily.   ezetimibe (ZETIA) 10 MG tablet Take 1 tablet (10 mg total) by mouth daily.   fluconazole (DIFLUCAN) 200 MG tablet Take 200 mg by mouth once a week.   omeprazole  (PRILOSEC) 40 MG capsule TAKE ONE CAPSULE BY MOUTH TWICE A DAY   omeprazole (PRILOSEC) 40 MG capsule Take by mouth.   rosuvastatin (CRESTOR) 5 MG tablet Take 5 mg by mouth daily.   sildenafil (REVATIO) 20 MG tablet TAKE 1 TABLET BY MOUTH THREE TIMES DAILY   traMADol (ULTRAM) 50 MG tablet Take by mouth.   valsartan (DIOVAN) 320 MG tablet Take 1 tablet (320 mg total) by mouth daily.   meloxicam (MOBIC) 15 MG tablet Take 1 tablet by mouth daily.   torsemide (DEMADEX) 10 MG tablet Take 1 tablet (10 mg total) by mouth daily.   [DISCONTINUED] gabapentin (NEURONTIN) 300 MG capsule Take 1 capsule by mouth 3 (three) times daily.   [DISCONTINUED] metoprolol tartrate (LOPRESSOR) 100 MG tablet Take 1 tablet (100 mg total) by mouth once for 1 dose. Take TWO hours prior to CT procedure   No facility-administered encounter medications on file as of 07/06/2021.    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   Past Surgical History:  Procedure Laterality Date   ABDOMINAL EXPLORATION SURGERY  1978   APPENDECTOMY     at same time as lymph node dissection   ESOPHAGOGASTRODUODENOSCOPY  03/08    and colon  INGUINAL HERNIA REPAIR  child   bilateral   JOINT REPLACEMENT     MOHS SURGERY Left nose   at [facility]--- 2013   prolactinoma excision  09/04   [facility]   right testicle removed  1978   SHOULDER OPEN ROTATOR CUFF REPAIR Left 6/17   and biceps repair--[facility]   stress nuclear  negative   10/09   TONSILLECTOMY     Family History  Problem Relation Age of Onset   AAA (abdominal aortic aneurysm) Father    Brain cancer Father    Cancer Paternal Aunt        lung   Cancer Other        strong in family   Cancer Brother         melanoma   Cerebral aneurysm Paternal Grandfather    Osteoporosis Mother    Social History   Socioeconomic History   Marital status: Significant Other    Spouse name: Not on file   Number of children: 0   Years of education: Not on file   Highest education level: Not on file  Occupational History   Occupation: Sales/Marketing boats  Tobacco Use   Smoking status: Former    Packs/day: 1.50    Years: 40.00    Pack years: 60.00    Types: Cigarettes    Quit date: 04/25/2012    Years since quitting: 9.2   Smokeless tobacco: Never  Vaping Use   Vaping Use: Never used  Substance and Sexual Activity   Alcohol use: Yes    Alcohol/week: 7.0 standard drinks    Types: 7 Standard drinks or equivalent per week    Comment: occassionally   Drug use: Yes    Types: Marijuana    Comment: When BP is elevated. maybe twice a month   Sexual activity: Yes    Partners: Female  Other Topics Concern   Not on file  Social History Narrative             Social Determinants of Health   Financial Resource Strain: Low Risk    Difficulty of Paying Living Expenses: Not hard at all  Food Insecurity: No Food Insecurity   Worried About Charity fundraiser in the Last Year: Never true   Arboriculturist in the Last Year: Never true  Transportation Needs: No Transportation Needs   Lack of Transportation (Medical): No   Lack of Transportation (Non-Medical): No  Physical Activity: Sufficiently Active   Days of Exercise per Week: 3 days   Minutes of Exercise per Session: 50 min  Stress: No Stress Concern Present   Feeling of Stress : Only a little  Social Connections: Socially Isolated   Frequency of Communication with Friends and Family: More than three times a week   Frequency of Social Gatherings with Friends and Family: Once a week   Attends Religious Services: Never   Marine scientist or Organizations: No   Attends Music therapist: Never   Marital Status: Divorced     Tobacco Counseling Counseling given: Not Answered   Clinical Intake:  Pre-visit preparation completed: Yes  Pain : 0-10 Pain Score: 7  Pain Type: Chronic pain Pain Location: Hand Pain Orientation: Other (Comment) Pain Descriptors / Indicators: Contraction, Sharp Pain Onset: Today Pain Frequency: Intermittent     Nutritional Risks: None Diabetes: No  How often do you need to have someone help you when you read instructions, pamphlets, or other written materials from your doctor  or pharmacy?: 1 - Never What is the last grade level you completed in school?: Doctorate Degree  Diabetic? NO  Interpreter Needed?: No      Activities of Daily Living In your present state of health, do you have any difficulty performing the following activities: 07/06/2021 10/15/2020  Hearing? Y N  Vision? N N  Difficulty concentrating or making decisions? N N  Walking or climbing stairs? N Y  Dressing or bathing? N N  Doing errands, shopping? N N  Preparing Food and eating ? N -  Using the Toilet? N -  In the past six months, have you accidently leaked urine? N -  Do you have problems with loss of bowel control? N -  Managing your Medications? N -  Managing your Finances? N -  Housekeeping or managing your Housekeeping? N -  Some recent data might be hidden    Patient Care Team: Jerrol Banana., MD as PCP - General (Family Medicine) Rockey Situ, Kathlene November, MD as PCP - Cardiology (Cardiology) Arvil Chaco, PA-C as Physician Assistant (Cardiology)  Indicate any recent Medical Services you may have received from other than [facility] providers in the past year (date may be approximate).     Assessment:   This is a routine wellness examination for Delrae Mekhi.  Hearing/Vision screen No results found.  Dietary issues and exercise activities discussed: Exercise limited by: None identified   Goals Addressed   None   Depression Screen PHQ 2/9 Scores 07/06/2021 10/15/2020 03/04/2020  12/04/2019 08/02/2018 07/13/2018 03/22/2018  PHQ - 2 Score 0 0 0 0 0 0 0  PHQ- 9 Score - 0 5 6 - - -    Fall Risk Fall Risk  07/06/2021 10/15/2020 03/04/2020 08/02/2018 07/13/2018  Falls in the past year? 0 0 0 No No  Number falls in past yr: 0 0 0 - -  Injury with Fall? 0 0 0 - -  Risk for fall due to : No Fall Risks No Fall Risks - - -  Risk for fall due to: Comment - - - - -  Follow up Falls evaluation completed Falls evaluation completed Falls evaluation completed - -    FALL RISK PREVENTION PERTAINING TO THE HOME:  Any stairs in or around the home? Yes  If so, are there any without handrails? Yes  Home free of loose throw rugs in walkways, pet beds, electrical cords, etc? No  Adequate lighting in your home to reduce risk of falls? Yes   ASSISTIVE DEVICES UTILIZED TO PREVENT FALLS:  Life alert? No  Use of a cane, walker or w/c? No  Grab bars in the bathroom? No  Shower chair or bench in shower? No  Elevated toilet seat or a handicapped toilet? No   TIMED UP AND GO:  Was the test performed? N/A.  Length of time to ambulate 10 feet: N/A sec.     Cognitive Function:     6CIT Screen 07/06/2021  What Year? 0 points  What month? 0 points  What time? 0 points  Count back from 20 0 points  Months in reverse 0 points  Repeat phrase 0 points  Total Score 0    Immunizations Immunization History  Administered Date(s) Administered   Influenza Split 10/12/2011   Moderna Sars-Covid-2 Vaccination 01/10/2020, 02/07/2020   Td 01/17/2003, 07/09/2009   Tdap 06/12/2014    TDAP status: Up to date  Flu Vaccine status: Due, Education has been provided regarding the importance of this vaccine. Advised may  receive this vaccine at local pharmacy or Health Dept. Aware to provide a copy of the vaccination record if obtained from local pharmacy or Health Dept. Verbalized acceptance and understanding.  Pneumococcal vaccine status: Due, Education has been provided regarding the importance of  this vaccine. Advised may receive this vaccine at local pharmacy or Health Dept. Aware to provide a copy of the vaccination record if obtained from local pharmacy or Health Dept. Verbalized acceptance and understanding.  Covid-19 vaccine status: Completed vaccines  Qualifies for Shingles Vaccine? No   Zostavax completed No   Shingrix Completed?: No.    Education has been provided regarding the importance of this vaccine. Patient has been advised to call insurance company to determine out of pocket expense if they have not yet received this vaccine. Advised may also receive vaccine at local pharmacy or Health Dept. Verbalized acceptance and understanding.  Screening Tests Health Maintenance  Topic Date Due   Zoster Vaccines- Shingrix (1 of 2) Never done   COVID-19 Vaccine (3 - Moderna risk series) 03/06/2020   INFLUENZA VACCINE  05/11/2021   COLONOSCOPY (Pts 45-53yrs Insurance coverage will need to be confirmed)  01/06/2022   TETANUS/TDAP  06/12/2024   Hepatitis C Screening  Completed   HPV VACCINES  Aged Out    Health Maintenance  Health Maintenance Due  Topic Date Due   Zoster Vaccines- Shingrix (1 of 2) Never done   COVID-19 Vaccine (3 - Moderna risk series) 03/06/2020   INFLUENZA VACCINE  05/11/2021    Colorectal cancer screening: Type of screening: Colonoscopy. Completed 01/06/2017. Repeat every 5 years  Lung Cancer Screening: (Low Dose CT Chest recommended if Age 72-80 years, 30 pack-year currently smoking OR have quit w/in 15years.) does not qualify.   Lung Cancer Screening Referral: NO  Additional Screening:  Hepatitis C Screening: does qualify; Completed 04/22/2020  Vision Screening: Recommended annual ophthalmology exams for early detection of glaucoma and other disorders of the eye. Is the patient up to date with their annual eye exam?  Yes  Who is the provider or what is the name of the office in which the patient attends annual eye exams? [facility] If pt is  not established with a provider, would they like to be referred to a provider to establish care? No .   Dental Screening: Recommended annual dental exams for proper oral hygiene  Community Resource Referral / Chronic Care Management: CRR required this visit?  No   CCM required this visit?  No      Plan:     I have personally reviewed and noted the following in the patient's chart:   Medical and social history Use of alcohol, tobacco or illicit drugs  Current medications and supplements including opioid prescriptions. Patient is not currently taking opioid prescriptions. Functional ability and status Nutritional status Physical activity Advanced directives List of other physicians Hospitalizations, surgeries, and ER visits in previous 12 months Vitals Screenings to include cognitive, depression, and falls Referrals and appointments  In addition, I have reviewed and discussed with patient certain preventive protocols, quality metrics, and best practice recommendations. A written personalized care plan for preventive services as well as general preventive health recommendations were provided to patient.  I connected with  [name] on 07/06/21 by a video enabled telemedicine application and verified that I am speaking with the correct person using two identifiers.   I discussed the limitations of evaluation and management by telemedicine. The patient expressed understanding and agreed to proceed.  Irena Reichmann, [facility]   07/06/2021   Nurse Notes: Non Face to Face 60 minutes

## 2021-07-30 ENCOUNTER — Other Ambulatory Visit: Payer: Self-pay | Admitting: Family Medicine

## 2021-07-30 NOTE — Telephone Encounter (Signed)
Patient will need an office visit for further refills. Requested Prescriptions  Pending Prescriptions Disp Refills  . buPROPion (WELLBUTRIN XL) 300 MG 24 hr tablet [Pharmacy Med Name: buPROPion HCL XL 300 MG TABLET] 30 tablet 0    Sig: TAKE ONE TABLET BY MOUTH DAILY     Psychiatry: Antidepressants - bupropion Failed - 07/30/2021  6:21 AM      Failed - Last BP in normal range    BP Readings from Last 1 Encounters:  03/11/21 (!) 184/111         Failed - Valid encounter within last 6 months    Recent Outpatient Visits          9 months ago Essential hypertension, benign   [facility] Jerrol Banana., MD   1 year ago COVID-19 virus infection   [facility] Jerrol Banana., MD   1 year ago Essential hypertension, benign   [facility] Jerrol Banana., MD   1 year ago [facility] preventive visit   [facility] Jerrol Banana., MD   1 year ago Essential hypertension, benign   [facility] Jerrol Banana., MD

## 2021-08-27 ENCOUNTER — Other Ambulatory Visit: Payer: Self-pay | Admitting: Family Medicine

## 2021-08-27 NOTE — Telephone Encounter (Signed)
Requested medications are due for refill today.  yes  Requested medications are on the active medications list.  yes  Last refill. 07/30/2021  Future visit scheduled.   no  Notes to clinic.  Already given a courtesy refill. Pt is more than 3 months overdue for an office visit.

## 2021-09-07 ENCOUNTER — Telehealth: Payer: Self-pay | Admitting: Family Medicine

## 2021-09-07 DIAGNOSIS — Z1152 Encounter for screening for COVID-19: Secondary | ICD-10-CM

## 2021-09-07 NOTE — Telephone Encounter (Signed)
You can order it but there is no diagnoses go to cover it .  Charges will be out-of-pocket

## 2021-09-07 NOTE — Telephone Encounter (Signed)
Please advise lab test?

## 2021-09-07 NOTE — Telephone Encounter (Signed)
Pt is calling to request if Dr. Rosanna Randy can send an order to [facility] to have his antiboties tested. Pt is not sick. He is wanting to know his levels.  [phone]

## 2021-09-08 NOTE — Telephone Encounter (Signed)
Lab ordered. Patient advised.

## 2021-09-11 ENCOUNTER — Encounter: Payer: Self-pay | Admitting: Cardiovascular Disease

## 2021-09-23 DIAGNOSIS — M25541 Pain in joints of right hand: Secondary | ICD-10-CM | POA: Insufficient documentation

## 2021-09-23 DIAGNOSIS — M25542 Pain in joints of left hand: Secondary | ICD-10-CM | POA: Insufficient documentation

## 2021-09-25 ENCOUNTER — Other Ambulatory Visit: Payer: Self-pay | Admitting: Internal Medicine

## 2021-09-29 DIAGNOSIS — M222X9 Patellofemoral disorders, unspecified knee: Secondary | ICD-10-CM | POA: Insufficient documentation

## 2021-11-02 ENCOUNTER — Other Ambulatory Visit: Payer: Self-pay | Admitting: Family Medicine

## 2021-12-01 ENCOUNTER — Ambulatory Visit: Payer: BLUE CROSS/BLUE SHIELD | Admitting: Family Medicine

## 2021-12-21 ENCOUNTER — Telehealth: Payer: Self-pay

## 2021-12-21 NOTE — Telephone Encounter (Signed)
Patient called [place] and was transferred to [facility].  I spoke with this patient as he had questions regarding his procedure scheduled for Wednesday.  He had questions regarding his medications and I let him know he needed to talk with Dr. Sharlet Salina office and he stated he had talked with them.  He states he will bring his medications with him on Wednesday as he does not want to take them on an empty stomach.  He also had questions about parking and I let him know about patient/visitor parking or the [employer].  He had questions about his significant other coming with him and I let him know that she should come with him as his driver post procedure.  He had questions about NPO and I confirmed with him to be NPO after midnight Tuesday night and he verbally confirmed.  He had questions about location of "[place]" and patient registration and I answered these as well.  Concluded conversation. ?

## 2021-12-23 ENCOUNTER — Other Ambulatory Visit: Payer: Self-pay

## 2021-12-23 ENCOUNTER — Encounter
Admission: RE | Disposition: A | Discharge status: Home / Self Care | Payer: Self-pay | Source: Home / Self Care | Attending: Cardiology

## 2021-12-23 ENCOUNTER — Ambulatory Visit
Admission: RE | Admit: 2021-12-23 | Discharge: 2021-12-23 | Disposition: A | Discharge status: Home / Self Care | Payer: Medicare Other | Attending: Cardiology | Admitting: Cardiology

## 2021-12-23 ENCOUNTER — Encounter: Payer: Self-pay | Admitting: Certified Registered"

## 2021-12-23 ENCOUNTER — Encounter: Payer: Self-pay | Admitting: Cardiology

## 2021-12-23 DIAGNOSIS — R079 Chest pain, unspecified: Secondary | ICD-10-CM

## 2021-12-23 DIAGNOSIS — I2582 Chronic total occlusion of coronary artery: Secondary | ICD-10-CM | POA: Diagnosis not present

## 2021-12-23 DIAGNOSIS — I2510 Atherosclerotic heart disease of native coronary artery without angina pectoris: Secondary | ICD-10-CM | POA: Diagnosis not present

## 2021-12-23 HISTORY — PX: LEFT HEART CATH AND CORONARY ANGIOGRAPHY: CATH118249

## 2021-12-23 SURGERY — LEFT HEART CATH AND CORONARY ANGIOGRAPHY
Anesthesia: Moderate Sedation | Laterality: Left

## 2021-12-23 MED ORDER — IOHEXOL 300 MG/ML  SOLN
INTRAMUSCULAR | Status: DC | PRN
  Administered 2021-12-23: 100 mL

## 2021-12-23 MED ORDER — VERAPAMIL HCL 2.5 MG/ML IV SOLN
INTRAVENOUS | Status: AC
  Filled 2021-12-23: qty 2

## 2021-12-23 MED ORDER — LIDOCAINE HCL 1 % IJ SOLN
INTRAMUSCULAR | Status: AC
  Filled 2021-12-23: qty 20

## 2021-12-23 MED ORDER — SODIUM CHLORIDE 0.9 % WEIGHT BASED INFUSION
1.0000 mL/kg/h | INTRAVENOUS | Status: DC
Start: 2021-12-23 — End: 2021-12-23

## 2021-12-23 MED ORDER — SODIUM CHLORIDE 0.9% FLUSH: 3.0000 mL | Freq: Two times a day (BID) | INTRAVENOUS | Status: DC

## 2021-12-23 MED ORDER — HEPARIN (PORCINE) IN NACL 1000-0.9 UT/500ML-% IV SOLN
INTRAVENOUS | Status: AC
  Filled 2021-12-23: qty 1000

## 2021-12-23 MED ORDER — ASPIRIN 81 MG PO CHEW
CHEWABLE_TABLET | ORAL | Status: AC
  Filled 2021-12-23: qty 1

## 2021-12-23 MED ORDER — VERAPAMIL HCL 2.5 MG/ML IV SOLN
INTRAVENOUS | Status: DC | PRN
Start: 2021-12-23 — End: 2021-12-23
  Administered 2021-12-23: 2.5 mg via INTRA_ARTERIAL

## 2021-12-23 MED ORDER — PROPOFOL 500 MG/50ML IV EMUL
INTRAVENOUS | Status: AC
  Filled 2021-12-23: qty 50

## 2021-12-23 MED ORDER — FENTANYL CITRATE (PF) 100 MCG/2ML IJ SOLN
INTRAMUSCULAR | Status: AC
  Filled 2021-12-23: qty 2

## 2021-12-23 MED ORDER — MIDAZOLAM HCL 2 MG/2ML IJ SOLN
INTRAMUSCULAR | Status: DC | PRN
  Administered 2021-12-23: 1 mg via INTRAVENOUS

## 2021-12-23 MED ORDER — SODIUM CHLORIDE 0.9% FLUSH: 3.0000 mL | INTRAVENOUS | Status: DC | PRN

## 2021-12-23 MED ORDER — HEPARIN SODIUM (PORCINE) 1000 UNIT/ML IJ SOLN
INTRAMUSCULAR | Status: AC
  Filled 2021-12-23: qty 10

## 2021-12-23 MED ORDER — HEPARIN SODIUM (PORCINE) 1000 UNIT/ML IJ SOLN
INTRAMUSCULAR | Status: DC | PRN
  Administered 2021-12-23: 5000 [IU] via INTRAVENOUS

## 2021-12-23 MED ORDER — HEPARIN (PORCINE) IN NACL 2000-0.9 UNIT/L-% IV SOLN
INTRAVENOUS | Status: DC | PRN
  Administered 2021-12-23: 1000 mL

## 2021-12-23 MED ORDER — SODIUM CHLORIDE 0.9 % WEIGHT BASED INFUSION
20.0000 mL/kg/h | INTRAVENOUS | Status: AC
Start: 2021-12-23 — End: 2021-12-23
  Administered 2021-12-23: 20 mL/kg/h via INTRAVENOUS

## 2021-12-23 MED ORDER — ASPIRIN EC 81 MG PO TBEC
81.0000 mg | DELAYED_RELEASE_TABLET | Freq: Every day | ORAL | 2 refills | Status: DC
Start: 2021-12-23 — End: 2022-06-04

## 2021-12-23 MED ORDER — LIDOCAINE HCL (PF) 1 % IJ SOLN
INTRAMUSCULAR | Status: DC | PRN
  Administered 2021-12-23: 2 mL

## 2021-12-23 MED ORDER — ASPIRIN 81 MG PO CHEW: 81.0000 mg | CHEWABLE_TABLET | ORAL | Status: DC

## 2021-12-23 MED ORDER — FENTANYL CITRATE (PF) 100 MCG/2ML IJ SOLN
INTRAMUSCULAR | Status: DC | PRN
  Administered 2021-12-23: 50 ug via INTRAVENOUS

## 2021-12-23 MED ORDER — SODIUM CHLORIDE 0.9 % IV SOLN: 250.0000 mL | INTRAVENOUS | Status: DC | PRN

## 2021-12-23 MED ORDER — MIDAZOLAM HCL 2 MG/2ML IJ SOLN
INTRAMUSCULAR | Status: AC
  Filled 2021-12-23: qty 2

## 2021-12-23 SURGICAL SUPPLY — 12 items
CATH 5F 110X4 TIG (CATHETERS) ×1 IMPLANT
CATH INFINITI JR4 5F (CATHETERS) ×1 IMPLANT
DEVICE RAD COMP TR BAND LRG (VASCULAR PRODUCTS) ×1 IMPLANT
DRAPE BRACHIAL (DRAPES) ×1 IMPLANT
GLIDESHEATH SLEND SS 6F .021 (SHEATH) ×1 IMPLANT
GUIDEWIRE INQWIRE 1.5J.035X260 (WIRE) IMPLANT
INQWIRE 1.5J .035X260CM (WIRE) ×2
KIT SYRINGE INJ CVI SPIKEX1 (MISCELLANEOUS) ×1 IMPLANT
PACK CARDIAC CATH (CUSTOM PROCEDURE TRAY) ×2 IMPLANT
PROTECTION STATION PRESSURIZED (MISCELLANEOUS) ×2
SET ATX SIMPLICITY (MISCELLANEOUS) ×1 IMPLANT
STATION PROTECTION PRESSURIZED (MISCELLANEOUS) IMPLANT

## 2021-12-30 ENCOUNTER — Telehealth: Payer: Self-pay | Admitting: Family Medicine

## 2021-12-30 DIAGNOSIS — Z789 Other specified health status: Secondary | ICD-10-CM | POA: Insufficient documentation

## 2021-12-30 DIAGNOSIS — Z9889 Other specified postprocedural states: Secondary | ICD-10-CM | POA: Insufficient documentation

## 2021-12-30 NOTE — Telephone Encounter (Signed)
[facility] faxed refill request for the following medications: ? ?buPROPion (WELLBUTRIN XL) 300 MG 24 hr tablet  ? ?Please advise. ? ?

## 2021-12-30 NOTE — Telephone Encounter (Signed)
Patient needs an ov.  ?

## 2022-01-05 DIAGNOSIS — R079 Chest pain, unspecified: Secondary | ICD-10-CM

## 2022-01-21 ENCOUNTER — Telehealth (INDEPENDENT_AMBULATORY_CARE_PROVIDER_SITE_OTHER): Payer: Medicare Other | Admitting: Family Medicine

## 2022-01-21 DIAGNOSIS — Z8547 Personal history of malignant neoplasm of testis: Secondary | ICD-10-CM

## 2022-01-21 DIAGNOSIS — F419 Anxiety disorder, unspecified: Secondary | ICD-10-CM | POA: Diagnosis not present

## 2022-01-21 DIAGNOSIS — I10 Essential (primary) hypertension: Secondary | ICD-10-CM

## 2022-01-21 DIAGNOSIS — F5101 Primary insomnia: Secondary | ICD-10-CM | POA: Diagnosis not present

## 2022-01-21 DIAGNOSIS — E785 Hyperlipidemia, unspecified: Secondary | ICD-10-CM

## 2022-01-21 MED ORDER — BUPROPION HCL ER (XL) 300 MG PO TB24
300.0000 mg | ORAL_TABLET | Freq: Every day | ORAL | 3 refills | Status: AC
Start: 2022-01-21 — End: ?

## 2022-01-21 MED ORDER — SERTRALINE HCL 50 MG PO TABS: 50.0000 mg | ORAL_TABLET | Freq: Every day | ORAL | 3 refills | Status: DC

## 2022-01-21 MED ORDER — ALPRAZOLAM 0.5 MG PO TABS: 0.5000 mg | ORAL_TABLET | Freq: Every evening | ORAL | 3 refills | Status: DC | PRN

## 2022-01-21 NOTE — Progress Notes (Signed)
? ?Established Patient Office Visit ? ?Subjective:  ?Patient ID: Dustin [name], male    DOB: Jun 22, 1956  Age: 66 y.o. MRN: [id number] ?This is a telemedicine visit for this patient.  Audio and video are both working. ?CC: No chief complaint on file. ? ? ?HPI ?Dustin [name] presents for patient calls for refill on bupropion.  He is seeing Dr. Saralyn Pilar from cardiology and his blood pressure is better and his cholesterol is better. ?Having a lot of work stress and feels irritable a lot.  No frank depression but is anxious.  He did take some of his partners Xanax 0.5 mg at night on occasion for sleep and it helps.  States he is willing to take another medication for his anxiety and irritability.  He denies depression or anhedonia. ? ?Past Medical History:  ?Diagnosis Date  ? Arthritis   ? Family history of cerebral aneurysm   ? History of colonic polyps   ? History of COVID-19 07/2020  ? Hypertension   ? 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 ? ?Past Surgical History:  ?Procedure Laterality Date  ? ABDOMINAL EXPLORATION SURGERY  1978  ? APPENDECTOMY    ? at same time as lymph node dissection  ? ESOPHAGOGASTRODUODENOSCOPY  03/08   ? and colon  ? INGUINAL HERNIA REPAIR  child  ? bilateral  ? JOINT REPLACEMENT    ? LEFT HEART CATH AND CORONARY ANGIOGRAPHY Left 12/23/2021  ? Procedure: LEFT HEART CATH AND CORONARY ANGIOGRAPHY;  Surgeon: Isaias Cowman, MD;  Location: [place] [facility];  Service: Cardiovascular;  Laterality: Left;  ? MOHS SURGERY Left nose  ? at [facility]--- 2013  ? prolactinoma excision  09/04  ? Dustin [name]  ? right testicle removed  1978  ? SHOULDER OPEN ROTATOR CUFF REPAIR Left 6/17  ? and biceps repair--[facility]  ? stress nuclear  negative  ? 10/09  ? TONSILLECTOMY    ? ? ?Family History  ?Problem Relation Age of Onset  ? AAA (abdominal aortic aneurysm) Father   ? Brain cancer Father   ? Cancer Paternal Aunt   ?     lung  ? Cancer Other   ?     strong  in family  ? Cancer Brother   ?     melanoma  ? Cerebral aneurysm Paternal Grandfather   ? Osteoporosis Mother   ? ? ?Social History  ? ?Socioeconomic History  ? Marital status: Significant Other  ?  Spouse name: Not on file  ? Number of children: 0  ? Years of education: Not on file  ? Highest education level: Not on file  ?Occupational History  ? Occupation: Scientist, physiological  ?Tobacco Use  ? Smoking status: Former  ?  Packs/day: 1.50  ?  Years: 40.00  ?  Pack years: 60.00  ?  Types: Cigarettes  ?  Quit date: 04/25/2012  ?  Years since quitting: 9.7  ? Smokeless tobacco: Never  ?Vaping Use  ? Vaping Use: Never used  ?Substance and Sexual Activity  ? Alcohol use: Not Currently  ? Drug use: Yes  ?  Types: Marijuana  ?  Comment: When BP is elevated. maybe twice a month  ? Sexual activity: Yes  ?  Partners: Female  ?Other Topics Concern  ? Not on file  ?Social History Narrative  ?    ?   ?   ? ?Social Determinants of Health  ? ?Emergency planning/management officer  Strain: Low Risk   ? Difficulty of Paying Living Expenses: Not hard at all  ?Food Insecurity: No Food Insecurity  ? Worried About Charity fundraiser in the Last Year: Never true  ? Ran Out of Food in the Last Year: Never true  ?Transportation Needs: No Transportation Needs  ? Lack of Transportation (Medical): No  ? Lack of Transportation (Non-Medical): No  ?Physical Activity: Sufficiently Active  ? Days of Exercise per Week: 3 days  ? Minutes of Exercise per Session: 50 min  ?Stress: No Stress Concern Present  ? Feeling of Stress : Only a little  ?Social Connections: Socially Isolated  ? Frequency of Communication with Friends and Family: More than three times a week  ? Frequency of Social Gatherings with Friends and Family: Once a week  ? Attends Religious Services: Never  ? Active Member of Clubs or Organizations: No  ? Attends Archivist Meetings: Never  ? Marital Status: Divorced  ?Intimate Partner Violence: Not At Risk  ? Fear of Current or Ex-Partner: No   ? Emotionally Abused: No  ? Physically Abused: No  ? Sexually Abused: No  ? ? ?Outpatient Medications Prior to Visit  ?Medication Sig Dispense Refill  ? aspirin EC 81 MG tablet Take 1 tablet (81 mg total) by mouth daily. Swallow whole. 150 tablet 2  ? Brimonidine Tartrate (LUMIFY) 0.025 % SOLN Place 1 drop into both eyes daily.    ? meloxicam (MOBIC) 15 MG tablet Take 15 mg by mouth daily as needed (Arthritis).    ? metoprolol succinate (TOPROL-XL) 50 MG 24 hr tablet Take 50 mg by mouth daily.    ? omeprazole (PRILOSEC) 40 MG capsule TAKE ONE CAPSULE BY MOUTH TWICE A DAY 180 capsule 1  ? OVER THE COUNTER MEDICATION Take 2 tablets by mouth daily. Super Beets ?Chewable    ? rosuvastatin (CRESTOR) 5 MG tablet Take 5 mg by mouth every other day.    ? sildenafil (REVATIO) 20 MG tablet TAKE 1 TABLET BY MOUTH THREE TIMES DAILY (Patient taking differently: Take 60 mg by mouth daily.) 90 tablet 0  ? tamsulosin (FLOMAX) 0.4 MG CAPS capsule Take 0.4 mg by mouth daily.    ? torsemide (DEMADEX) 10 MG tablet Take 1 tablet (10 mg total) by mouth daily. 30 tablet 3  ? valsartan (DIOVAN) 320 MG tablet Take 1 tablet (320 mg total) by mouth daily. 30 tablet 5  ? buPROPion (WELLBUTRIN XL) 300 MG 24 hr tablet TAKE ONE TABLET BY MOUTH DAILY 30 tablet 1  ? ?No facility-administered medications prior to visit.  ? ? ?Allergies  ?Allergen Reactions  ? Amlodipine Swelling  ?  LE EDEMA  ? Varenicline Tartrate   ? ? ?ROS ?Review of Systems ? ?  ?Objective:  ?  ?Physical Exam ? ?There were no vitals taken for this visit. ?Wt Readings from Last 3 Encounters:  ?12/23/21 230 lb (104.3 kg)  ?03/11/21 228 lb (103.4 kg)  ?03/06/21 229 lb (103.9 kg)  ? ? ? ?Health Maintenance Due  ?Topic Date Due  ? Zoster Vaccines- Shingrix (1 of 2) Never done  ? Pneumonia Vaccine 13+ Years old (1 - PCV) Never done  ? COVID-19 Vaccine (3 - Moderna risk series) 03/06/2020  ? COLONOSCOPY (Pts 45-67yr Insurance coverage will need to be confirmed)  01/06/2022   ? ? ?There are no preventive care reminders to display for this patient. ? ?Lab Results  ?Component Value Date  ? TSH 1.410 04/22/2020  ? ?  Lab Results  ?Component Value Date  ? WBC 6.3 04/22/2020  ? HGB 17.8 (H) 04/22/2020  ? HCT 52.1 (H) 04/22/2020  ? MCV 95 04/22/2020  ? PLT 171 04/22/2020  ? ?Lab Results  ?Component Value Date  ? NA 141 03/06/2021  ? K 4.0 03/06/2021  ? CO2 25 03/06/2021  ? GLUCOSE 82 03/06/2021  ? BUN 18 03/06/2021  ? CREATININE 1.43 (H) 03/06/2021  ? BILITOT 0.3 03/06/2021  ? ALKPHOS 82 03/06/2021  ? AST 30 03/06/2021  ? ALT 40 03/06/2021  ? PROT 6.2 03/06/2021  ? ALBUMIN 4.3 03/06/2021  ? CALCIUM 9.2 03/06/2021  ? ANIONGAP 9 02/06/2021  ? EGFR 54 (L) 03/06/2021  ? GFR 57.57 (L) 05/10/2019  ? ?Lab Results  ?Component Value Date  ? CHOL 208 (H) 03/06/2021  ? ?Lab Results  ?Component Value Date  ? HDL 45 03/06/2021  ? ?Lab Results  ?Component Value Date  ? LDLCALC 117 (H) 03/06/2021  ? ?Lab Results  ?Component Value Date  ? TRIG 262 (H) 03/06/2021  ? ?Lab Results  ?Component Value Date  ? CHOLHDL 4.6 03/06/2021  ? ?No results found for: HGBA1C ? ?  ?Assessment & Plan:  ? ?Problem List Items Addressed This Visit   ?None ?1. Anxiety ?After discussion with patient we will try sertraline 50 mg daily.  We discussed side effects and I will see him back in about 2 months. ? ?2. Primary insomnia ?Prescription written for Xanax 0.5 mg to take about once or twice a week. ? ?3. History of testicular cancer ? ? ?4. Essential hypertension, benign ?Moving.  Patient to bring in home blood pressure readings on his visit. ? ?5. Hyperlipidemia LDL goal <100 ?Recently seen cardiology. ? ? ?Meds ordered this encounter  ?Medications  ? buPROPion (WELLBUTRIN XL) 300 MG 24 hr tablet  ?  Sig: Take 1 tablet (300 mg total) by mouth daily.  ?  Dispense:  90 tablet  ?  Refill:  3  ? ALPRAZolam (XANAX) 0.5 MG tablet  ?  Sig: Take 1 tablet (0.5 mg total) by mouth at bedtime as needed for anxiety.  ?  Dispense:  20 tablet  ?   Refill:  3  ? sertraline (ZOLOFT) 50 MG tablet  ?  Sig: Take 1 tablet (50 mg total) by mouth daily.  ?  Dispense:  30 tablet  ?  Refill:  3  ? ? ?Follow-up: No follow-ups on file.  ? ?I, Wilhemena Durie, MD,

## 2022-02-10 ENCOUNTER — Other Ambulatory Visit: Payer: Self-pay | Admitting: Neurosurgery

## 2022-02-10 DIAGNOSIS — G959 Disease of spinal cord, unspecified: Secondary | ICD-10-CM

## 2022-03-11 ENCOUNTER — Ambulatory Visit
Admission: RE | Admit: 2022-03-11 | Discharge: 2022-03-11 | Disposition: A | Discharge status: Home / Self Care | Payer: Medicare Other | Source: Ambulatory Visit | Attending: Neurosurgery | Admitting: Neurosurgery

## 2022-03-11 DIAGNOSIS — G959 Disease of spinal cord, unspecified: Secondary | ICD-10-CM | POA: Diagnosis not present

## 2022-03-11 MED ORDER — GADOBUTROL 1 MMOL/ML IV SOLN
10.0000 mL | Freq: Once | INTRAVENOUS | Status: AC | PRN
  Administered 2022-03-11: 10 mL via INTRAVENOUS

## 2022-03-19 ENCOUNTER — Encounter: Payer: Self-pay | Admitting: Physician Assistant

## 2022-03-19 ENCOUNTER — Ambulatory Visit (INDEPENDENT_AMBULATORY_CARE_PROVIDER_SITE_OTHER): Payer: Medicare Other | Admitting: Physician Assistant

## 2022-03-19 ENCOUNTER — Other Ambulatory Visit (HOSPITAL_COMMUNITY)
Admission: RE | Admit: 2022-03-19 | Discharge: 2022-03-19 | Disposition: A | Discharge status: Home / Self Care | Payer: Medicare Other | Source: Ambulatory Visit | Attending: Physician Assistant | Admitting: Physician Assistant

## 2022-03-19 ENCOUNTER — Other Ambulatory Visit: Payer: Self-pay | Admitting: Physician Assistant

## 2022-03-19 ENCOUNTER — Ambulatory Visit: Payer: Self-pay

## 2022-03-19 VITALS — BP 130/81 | HR 76 | Temp 98.2°F | Resp 16 | Wt 230.0 lb

## 2022-03-19 DIAGNOSIS — N4889 Other specified disorders of penis: Secondary | ICD-10-CM | POA: Diagnosis present

## 2022-03-19 DIAGNOSIS — R300 Dysuria: Secondary | ICD-10-CM | POA: Diagnosis not present

## 2022-03-19 MED ORDER — SULFAMETHOXAZOLE-TRIMETHOPRIM 800-160 MG PO TABS: 1.0000 | ORAL_TABLET | Freq: Two times a day (BID) | ORAL | 0 refills | Status: DC

## 2022-03-19 NOTE — Progress Notes (Unsigned)
I,Jana Kimberla Driskill,acting as a Education administrator for [employer], PA-C.,have documented all relevant documentation on the behalf of Mardene Speak, PA-C,as directed by  [employer], PA-C while in the presence of [employer], PA-C.  Established patient visit   Patient: Dustin [name]   DOB: September 01, 1956   66 y.o. Male  MRN: [id number] Visit Date: 03/19/2022  Today's healthcare provider: Mardene Speak, PA-C   No chief complaint on file. Penile pain   Subjective    Urinary symptoms  He reports new onset penile pain/tip/ urinary frequency and burning. The current episode started several months ago and is gradually worsening. Patient states symptoms are moderate in intensity, occurring constantly. He  has been recently treated for similar symptoms. Endorses discomfort in the perineum which is made worse by prolonged sitting.  Patient took some left over Doxycycline # 4 and had some relief.  Pain inside the tip of penis, also at times small amount of output and dark urine.  Has been seen by urologist approximately 1 mo ago/ Lowella Fairy at [facility] and reported on problems.  Reports retrograde ejaculation.     Associated symptoms: No abdominal pain No back pain  No chills No constipation  No cramping No diarrhea  No discharge No fever  No hematuria No nausea  No vomiting    ---------------------------------------------------------------------------------------   Medications: Outpatient Medications Prior to Visit  Medication Sig   ALPRAZolam (XANAX) 0.5 MG tablet Take 1 tablet (0.5 mg total) by mouth at bedtime as needed for anxiety.   aspirin EC 81 MG tablet Take 1 tablet (81 mg total) by mouth daily. Swallow whole.   Brimonidine Tartrate (LUMIFY) 0.025 % SOLN Place 1 drop into both eyes daily.   buPROPion (WELLBUTRIN XL) 300 MG 24 hr tablet Take 1 tablet (300 mg total) by mouth daily.   meloxicam (MOBIC) 15 MG tablet Take 15 mg by mouth daily as needed (Arthritis).   metoprolol  succinate (TOPROL-XL) 50 MG 24 hr tablet Take 50 mg by mouth daily.   omeprazole (PRILOSEC) 40 MG capsule TAKE ONE CAPSULE BY MOUTH TWICE A DAY   OVER THE COUNTER MEDICATION Take 2 tablets by mouth daily. Super Beets Chewable   rosuvastatin (CRESTOR) 5 MG tablet Take 5 mg by mouth every other day.   sildenafil (REVATIO) 20 MG tablet TAKE 1 TABLET BY MOUTH THREE TIMES DAILY (Patient taking differently: Take 60 mg by mouth daily.)   tamsulosin (FLOMAX) 0.4 MG CAPS capsule Take 0.4 mg by mouth daily.   sertraline (ZOLOFT) 50 MG tablet Take 1 tablet (50 mg total) by mouth daily.   torsemide (DEMADEX) 10 MG tablet Take 1 tablet (10 mg total) by mouth daily.   valsartan (DIOVAN) 320 MG tablet Take 1 tablet (320 mg total) by mouth daily.   No facility-administered medications prior to visit.    Review of Systems  Constitutional:  Positive for activity change.  Genitourinary:  Positive for dysuria, frequency, penile pain and urgency.  Psychiatric/Behavioral:  Positive for dysphoric mood.   All other systems reviewed and are negative.  See hpi    Objective    BP 130/81 (BP Location: Right Arm, Patient Position: Sitting, Cuff Size: Normal)   Pulse 76   Temp 98.2 F (36.8 C) (Oral)   Resp 16   Wt 230 lb (104.3 kg)   SpO2 96%   BMI 33.97 kg/m    Physical Exam Vitals reviewed.  Constitutional:      General: He is in acute  distress.     Appearance: Normal appearance. He is obese.  HENT:     Head: Normocephalic and atraumatic.  Pulmonary:     Effort: Pulmonary effort is normal.  Neurological:     General: No focal deficit present.     Mental Status: He is alert and oriented to person, place, and time.  Psychiatric:        Behavior: Behavior normal.        Thought Content: Thought content normal.        Judgment: Judgment normal.     No results found for any visits on 03/19/22.  Assessment & Plan     1. Dysuria Could be due to balanitis vs dermatitis vs cellulitits vs UTI  vs prostatitis Had a course of antifungal treatment. - Urine Culture - Urinalysis, microscopic only - sulfamethoxazole-trimethoprim (BACTRIM DS) 800-160 MG tablet; Take 1 tablet by mouth 2 (two) times daily.  Dispense: 14 tablet; Refill: 0 Recommended : Warm compresses or sitz baths. Local hygiene, hydrocortisone 1% cream Bid Pt was educated on duration of therapy for prostatitis. FU in a week with his PCP  2. Penile pain Could be due to UTI vs prostatitis - Urine cytology ancillary only - sulfamethoxazole-trimethoprim (BACTRIM DS) 800-160 MG tablet; Take 1 tablet by mouth 2 (two) times daily.  Dispense: 14 tablet; Refill: 0   FU with Dr. Rosanna Randy in a week     The patient was advised to call back or seek an in-person evaluation if the symptoms worsen or if the condition fails to improve as anticipated.  I discussed the assessment and treatment plan with the patient. The patient was provided an opportunity to ask questions and all were answered. The patient agreed with the plan and demonstrated an understanding of the instructions.  The entirety of the information documented in the History of Present Illness, Review of Systems and Physical Exam were personally obtained by me. Portions of this information were initially documented by the CMA and reviewed by me for thoroughness and accuracy.  Portions of this note were created using dictation software and may contain typographical errors.    Mardene Speak, PA-C  [facility] [phone] (phone) [phone] (fax)  [place]

## 2022-03-19 NOTE — Telephone Encounter (Signed)
  Chief Complaint: Pain inside tip of penis, also urinary urgency Symptoms: Pain, Urinary urgency, dark urine at times Frequency: Months - but has gotten worse Pertinent Negatives: Patient denies fever, discharge Disposition: '[]'$ ED /'[]'$ Urgent Care (no appt availability in office) / '[x]'$ Appointment(In office/virtual)/ '[]'$  [place] [facility]/ '[]'$ Home Care/ '[]'$ Refused Recommended Disposition /'[]'$ [place] [facility]/ '[]'$  Follow-up with PCP Additional Notes: Pt took some left over Doxycycline and found some relief. Pt has pain inside the tip of his penis, also urinary frequency and at time small amount of out put.      Summary: possible UTI,burning sensation consistently, but not much urine comes out.   Pt stated a possible UTI started a few months ago. Pt stated experiencing a burning sensation consistently, but not much urine comes out.   Pt requesting medication.    Pt seeking clinical advice.      Answer Assessment - Initial Assessment Questions 1. SYMPTOM: "What's the main symptom you're concerned about?" (e.g., frequency, incontinence)     Urgency, 2. ONSET: "When did the    start?"     Months ago 3. PAIN: "Is there any pain?" If Yes, ask: "How bad is it?" (Scale: 1-10; mild, moderate, severe)     yes 4. CAUSE: "What do you think is causing the symptoms?"     Unsure 5. OTHER SYMPTOMS: "Do you have any other symptoms?" (e.g., fever, flank pain, blood in urine, pain with urination)     Fever, small amount of urine 6. PREGNANCY: "Is there any chance you are pregnant?" "When was your last menstrual period?"     na  Protocols used: Urinary Symptoms-A-AH

## 2022-03-20 ENCOUNTER — Encounter: Payer: Self-pay | Admitting: Physician Assistant

## 2022-03-20 LAB — URINALYSIS, MICROSCOPIC ONLY
Bacteria, UA: NONE SEEN
Casts: NONE SEEN /lpf
RBC, Urine: NONE SEEN /hpf (ref 0–2)

## 2022-03-22 LAB — URINE CULTURE

## 2022-03-23 ENCOUNTER — Telehealth: Payer: Self-pay | Admitting: Physician Assistant

## 2022-03-23 LAB — URINE CYTOLOGY ANCILLARY ONLY
Chlamydia: NEGATIVE
Comment: NEGATIVE
Comment: NEGATIVE
Comment: NORMAL
Neisseria Gonorrhea: NEGATIVE
Trichomonas: NEGATIVE

## 2022-03-23 NOTE — Progress Notes (Signed)
dysuria     Established patient visit  I,[name],acting as a scribe for Wilhemena Durie, MD.,have documented all relevant documentation on the behalf of Wilhemena Durie, MD,as directed by  Wilhemena Durie, MD while in the presence of Wilhemena Durie, MD.   Patient: Dustin [name]   DOB: 1956-09-30   66 y.o. Male  MRN: [id number] Visit Date: 03/24/2022  Today's healthcare provider: Wilhemena Durie, MD   Chief Complaint  Patient presents with   Dysuria   Subjective    HPI  Urinary symptoms  He reports recurrent dysuria. The current episode started several months ago and is gradually worsening. Patient states symptoms are severe in intensity, occurring constantly. He  has been recently treated for similar symptoms.  He saw Dr. Mingo Amber last month who is ahead of [facility] urology.  He took doxycycline for 3 days.  He was started on Flomax and sildenafil.  Septra last week helped his urinary symptoms.  In addition to frequency and dysuria he has a lot of urgency.  No gross hematuria.  No fevers.   Associated symptoms: No abdominal pain No back pain  No chills No constipation  No cramping No diarrhea  No discharge No fever  No hematuria No nausea  No vomiting    ---------------------------------------------------------------------------------------   Medications: Outpatient Medications Prior to Visit  Medication Sig   ALPRAZolam (XANAX) 0.5 MG tablet Take 1 tablet (0.5 mg total) by mouth at bedtime as needed for anxiety.   aspirin EC 81 MG tablet Take 1 tablet (81 mg total) by mouth daily. Swallow whole.   Brimonidine Tartrate (LUMIFY) 0.025 % SOLN Place 1 drop into both eyes daily.   buPROPion (WELLBUTRIN XL) 300 MG 24 hr tablet Take 1 tablet (300 mg total) by mouth daily.   meloxicam (MOBIC) 15 MG tablet Take 15 mg by mouth daily as needed (Arthritis).   metoprolol succinate (TOPROL-XL) 50 MG 24 hr tablet Take 50 mg by mouth daily.   omeprazole (PRILOSEC) 40 MG  capsule TAKE ONE CAPSULE BY MOUTH TWICE A DAY   OVER THE COUNTER MEDICATION Take 2 tablets by mouth daily. Super Beets Chewable   rosuvastatin (CRESTOR) 5 MG tablet Take 5 mg by mouth every other day.   tamsulosin (FLOMAX) 0.4 MG CAPS capsule Take 0.4 mg by mouth daily.   [DISCONTINUED] sulfamethoxazole-trimethoprim (BACTRIM DS) 800-160 MG tablet Take 1 tablet by mouth 2 (two) times daily.   valsartan (DIOVAN) 320 MG tablet Take 1 tablet (320 mg total) by mouth daily.   No facility-administered medications prior to visit.    Review of Systems  Last CBC Lab Results  Component Value Date   WBC 7.1 03/27/2022   HGB 15.6 03/27/2022   HCT 45.9 03/27/2022   MCV 91.8 03/27/2022   MCH 31.2 03/27/2022   RDW 11.9 03/27/2022   PLT 176 03/27/2022       Objective    BP (!) 146/82 (BP Location: Right Arm, Patient Position: Sitting, Cuff Size: Large)   Pulse 76   Resp 16   Wt 230 lb (104.3 kg)   SpO2 95%   BMI 33.97 kg/m  BP Readings from Last 3 Encounters:  03/29/22 (!) 151/81  03/28/22 113/74  03/24/22 (!) 146/82   Wt Readings from Last 3 Encounters:  03/29/22 230 lb (104.3 kg)  03/27/22 230 lb (104.3 kg)  03/24/22 230 lb (104.3 kg)      Physical Exam Vitals reviewed.  HENT:     Head:  Normocephalic and atraumatic.     Right Ear: External ear normal.     Left Ear: External ear normal.     Nose: Nose normal.  Eyes:     General: No scleral icterus.    Conjunctiva/sclera: Conjunctivae normal.  Cardiovascular:     Rate and Rhythm: Normal rate and regular rhythm.     Pulses: Normal pulses.     Heart sounds: Normal heart sounds.  Pulmonary:     Effort: Pulmonary effort is normal.     Breath sounds: Normal breath sounds.  Abdominal:     Palpations: Abdomen is soft.  Musculoskeletal:     Right lower leg: No edema.     Left lower leg: No edema.  Lymphadenopathy:     Cervical: No cervical adenopathy.  Skin:    General: Skin is warm and dry.  Neurological:     General:  No focal deficit present.     Mental Status: He is alert and oriented to person, place, and time.  Psychiatric:        Mood and Affect: Mood normal.        Behavior: Behavior normal.        Thought Content: Thought content normal.        Judgment: Judgment normal.       Results for orders placed or performed in visit on 03/24/22  POCT urinalysis dipstick  Result Value Ref Range   Color, UA Yellow    Clarity, UA Clear    Glucose, UA Negative Negative   Bilirubin, UA Negative    Ketones, UA Positive    Spec Grav, UA 1.010 1.010 - 1.025   Blood, UA Negative    pH, UA 6.0 5.0 - 8.0   Protein, UA Negative Negative   Urobilinogen, UA 0.2 0.2 or 1.0 E.U./dL   Nitrite, UA Negative    Leukocytes, UA Negative Negative    Assessment & Plan     1. Chronic prostatitis Would strongly consider your urology referral back to [facility] or locally.  He said Dr. Mingo Amber did a DRE to check his prostate - POCT urinalysis dipstick - CULTURE, URINE COMPREHENSIVE  2. Dysuria  - sulfamethoxazole-trimethoprim (BACTRIM DS) 800-160 MG tablet; Take 1 tablet by mouth 2 (two) times daily.  Dispense: 40 tablet; Refill: 0 - CULTURE, URINE COMPREHENSIVE  3. Penile pain  - sulfamethoxazole-trimethoprim (BACTRIM DS) 800-160 MG tablet; Take 1 tablet by mouth 2 (two) times daily.  Dispense: 40 tablet; Refill: 0 - CULTURE, URINE COMPREHENSIVE   Return in about 5 days (around 03/29/2022).      I, Wilhemena Durie, MD, have reviewed all documentation for this visit. The documentation on 03/29/22 for the exam, diagnosis, procedures, and orders are all accurate and complete.    Taheera Thomann Cranford Mon, MD  [facility] [phone] (phone) [phone] (fax)  [place]

## 2022-03-23 NOTE — Telephone Encounter (Signed)
Called to pt. Pt is doing better on antibiotics, sildenafil and tamsulosin/Urology prescribed. He will finish his current abx this Friday.  Discussed his lab results, further management. Pt will see Dr. Rosanna Randy tomorrow for FU. He will continue on current abx

## 2022-03-23 NOTE — Progress Notes (Signed)
Hello [name] ,   Your labwork results for STDs all are within normal limits.  No changes need to be made to medications, and no further tests need to be ordered at this point.  Any questions please reach out to the office or message me on MyChart!  Best,  Mardene Speak, PA-C

## 2022-03-24 ENCOUNTER — Ambulatory Visit (INDEPENDENT_AMBULATORY_CARE_PROVIDER_SITE_OTHER): Payer: Medicare Other | Admitting: Family Medicine

## 2022-03-24 ENCOUNTER — Encounter: Payer: Self-pay | Admitting: Family Medicine

## 2022-03-24 VITALS — BP 146/82 | HR 76 | Resp 16 | Wt 230.0 lb

## 2022-03-24 DIAGNOSIS — N411 Chronic prostatitis: Secondary | ICD-10-CM | POA: Diagnosis not present

## 2022-03-24 DIAGNOSIS — R300 Dysuria: Secondary | ICD-10-CM | POA: Diagnosis not present

## 2022-03-24 DIAGNOSIS — N4889 Other specified disorders of penis: Secondary | ICD-10-CM | POA: Diagnosis not present

## 2022-03-24 LAB — POCT URINALYSIS DIPSTICK
Bilirubin, UA: NEGATIVE
Blood, UA: NEGATIVE
Glucose, UA: NEGATIVE
Ketones, UA: POSITIVE
Leukocytes, UA: NEGATIVE
Nitrite, UA: NEGATIVE
Protein, UA: NEGATIVE
Spec Grav, UA: 1.01 (ref 1.010–1.025)
Urobilinogen, UA: 0.2 E.U./dL
pH, UA: 6 (ref 5.0–8.0)

## 2022-03-24 MED ORDER — SULFAMETHOXAZOLE-TRIMETHOPRIM 800-160 MG PO TABS: 1.0000 | ORAL_TABLET | Freq: Two times a day (BID) | ORAL | 0 refills | Status: DC

## 2022-03-26 ENCOUNTER — Ambulatory Visit: Payer: Self-pay

## 2022-03-26 NOTE — Telephone Encounter (Signed)
Please advise 

## 2022-03-26 NOTE — Telephone Encounter (Addendum)
I called and spoke with patient. Patient wants to know what to do about the muscle fatigue, HA, with activity SOB and increased HR dizziness. Patient states theses symptoms started after being seen by Dr. Rosanna Randy 2 days ago. I advised patient that I would check with one of the covering providers on their recommendations since Dr. Rosanna Randy was out of the office today. Patient replied " You know what, Dustin [name] been on Bactrim for more than a week. Call me back when yall can figure out whats going on". Patient then hung up the phone.

## 2022-03-26 NOTE — Telephone Encounter (Signed)
  Chief Complaint: muscle fatigue  Symptoms: muscle fatigue, HA, with activity SOB and increased HR dizziness Frequency: 2-3 days  Pertinent Negatives: NA Disposition: '[]'$ ED /'[]'$ Urgent Care (no appt availability in office) / '[x]'$ Appointment(In office/virtual)/ '[]'$  [place] [facility]/ '[]'$ Home Care/ '[]'$ Refused Recommended Disposition /'[]'$ [place] [facility]/ '[]'$  Follow-up with PCP Additional Notes: pt is taking Bactrim for prostate infection and today was suppose to be last day but pt says Dr. Rosanna Randy prescribed 2 more weeks. Pt is unsure if he needs to keep taking the medication or what to do. Advised him of taking Tylenol to help with pain and fatigue and since no appts for today, pt has appt with Dr. Rosanna Randy Monday so advised him I would send message back and someone can follow up with him on what to do with abx until Monday. Pt verbalized understanding.   Reason for Disposition  [1] MILD pain (e.g., does not interfere with normal activities) AND [2] present > 7 days  Answer Assessment - Initial Assessment Questions 1. ONSET: "When did the muscle aches or body pains start?"      1 week  2. LOCATION: "What part of your body is hurting?" (e.g., entire body, arms, legs)      Neck shoulder and lower back  3. SEVERITY: "How bad is the pain?" (Scale 1-10; or mild, moderate, severe)   - MILD (1-3): doesn't interfere with normal activities    - MODERATE (4-7): interferes with normal activities or awakens from sleep    - SEVERE (8-10):  excruciating pain, unable to do any normal activities      8-9 4. CAUSE: "What do you think is causing the pains?"     Thinks medication  6. OTHER SYMPTOMS: "Do you have any other symptoms?" (e.g., chest pain, weakness, rash, cold or flu symptoms, weight loss)     HA, SOB with activity and increased HR  Protocols used: Muscle Aches and Body Pain-A-AH

## 2022-03-27 ENCOUNTER — Other Ambulatory Visit: Payer: Self-pay

## 2022-03-27 ENCOUNTER — Observation Stay
Admission: EM | Admit: 2022-03-27 | Discharge: 2022-03-28 | Disposition: A | Discharge status: Home / Self Care | Payer: Medicare Other | Attending: Hospitalist | Admitting: Hospitalist

## 2022-03-27 ENCOUNTER — Emergency Department: Payer: Medicare Other

## 2022-03-27 DIAGNOSIS — I739 Peripheral vascular disease, unspecified: Secondary | ICD-10-CM | POA: Diagnosis not present

## 2022-03-27 DIAGNOSIS — I10 Essential (primary) hypertension: Secondary | ICD-10-CM | POA: Diagnosis present

## 2022-03-27 DIAGNOSIS — N1831 Chronic kidney disease, stage 3a: Secondary | ICD-10-CM | POA: Insufficient documentation

## 2022-03-27 DIAGNOSIS — N179 Acute kidney failure, unspecified: Secondary | ICD-10-CM | POA: Diagnosis not present

## 2022-03-27 DIAGNOSIS — I2510 Atherosclerotic heart disease of native coronary artery without angina pectoris: Secondary | ICD-10-CM | POA: Insufficient documentation

## 2022-03-27 DIAGNOSIS — Z87891 Personal history of nicotine dependence: Secondary | ICD-10-CM | POA: Diagnosis not present

## 2022-03-27 DIAGNOSIS — R079 Chest pain, unspecified: Secondary | ICD-10-CM | POA: Diagnosis present

## 2022-03-27 DIAGNOSIS — F32A Depression, unspecified: Secondary | ICD-10-CM | POA: Diagnosis present

## 2022-03-27 DIAGNOSIS — Z8547 Personal history of malignant neoplasm of testis: Secondary | ICD-10-CM | POA: Diagnosis not present

## 2022-03-27 DIAGNOSIS — Z7982 Long term (current) use of aspirin: Secondary | ICD-10-CM | POA: Diagnosis not present

## 2022-03-27 DIAGNOSIS — E785 Hyperlipidemia, unspecified: Secondary | ICD-10-CM | POA: Diagnosis present

## 2022-03-27 DIAGNOSIS — I129 Hypertensive chronic kidney disease with stage 1 through stage 4 chronic kidney disease, or unspecified chronic kidney disease: Secondary | ICD-10-CM | POA: Diagnosis not present

## 2022-03-27 DIAGNOSIS — R55 Syncope and collapse: Secondary | ICD-10-CM | POA: Diagnosis not present

## 2022-03-27 DIAGNOSIS — I959 Hypotension, unspecified: Secondary | ICD-10-CM | POA: Diagnosis present

## 2022-03-27 DIAGNOSIS — R42 Dizziness and giddiness: Secondary | ICD-10-CM | POA: Insufficient documentation

## 2022-03-27 DIAGNOSIS — Z96698 Presence of other orthopedic joint implants: Secondary | ICD-10-CM | POA: Insufficient documentation

## 2022-03-27 DIAGNOSIS — R0789 Other chest pain: Secondary | ICD-10-CM | POA: Diagnosis not present

## 2022-03-27 DIAGNOSIS — N411 Chronic prostatitis: Secondary | ICD-10-CM | POA: Diagnosis present

## 2022-03-27 DIAGNOSIS — Z79899 Other long term (current) drug therapy: Secondary | ICD-10-CM | POA: Insufficient documentation

## 2022-03-27 DIAGNOSIS — E538 Deficiency of other specified B group vitamins: Secondary | ICD-10-CM | POA: Insufficient documentation

## 2022-03-27 DIAGNOSIS — R110 Nausea: Secondary | ICD-10-CM

## 2022-03-27 DIAGNOSIS — Z8616 Personal history of COVID-19: Secondary | ICD-10-CM | POA: Insufficient documentation

## 2022-03-27 DIAGNOSIS — K219 Gastro-esophageal reflux disease without esophagitis: Secondary | ICD-10-CM | POA: Diagnosis present

## 2022-03-27 LAB — CBC
HCT: 45.9 % (ref 39.0–52.0)
Hemoglobin: 15.6 g/dL (ref 13.0–17.0)
MCH: 31.2 pg (ref 26.0–34.0)
MCHC: 34 g/dL (ref 30.0–36.0)
MCV: 91.8 fL (ref 80.0–100.0)
Platelets: 176 10*3/uL (ref 150–400)
RBC: 5 MIL/uL (ref 4.22–5.81)
RDW: 11.9 % (ref 11.5–15.5)
WBC: 7.1 10*3/uL (ref 4.0–10.5)
nRBC: 0 % (ref 0.0–0.2)

## 2022-03-27 LAB — BASIC METABOLIC PANEL
Anion gap: 11 (ref 5–15)
Anion gap: 6 (ref 5–15)
BUN: 32 mg/dL — ABNORMAL HIGH (ref 8–23)
BUN: 32 mg/dL — ABNORMAL HIGH (ref 8–23)
CO2: 21 mmol/L — ABNORMAL LOW (ref 22–32)
CO2: 24 mmol/L (ref 22–32)
Calcium: 8.9 mg/dL (ref 8.9–10.3)
Calcium: 8.4 mg/dL — ABNORMAL LOW (ref 8.9–10.3)
Chloride: 105 mmol/L (ref 98–111)
Chloride: 107 mmol/L (ref 98–111)
Creatinine, Ser: 2.62 mg/dL — ABNORMAL HIGH (ref 0.61–1.24)
Creatinine, Ser: 2.36 mg/dL — ABNORMAL HIGH (ref 0.61–1.24)
GFR, Estimated: 26 mL/min — ABNORMAL LOW (ref >60)
GFR, Estimated: 29 mL/min — ABNORMAL LOW (ref >60)
Glucose, Bld: 125 mg/dL — ABNORMAL HIGH (ref 70–99)
Glucose, Bld: 98 mg/dL (ref 70–99)
Potassium: 4.7 mmol/L (ref 3.5–5.1)
Potassium: 4.8 mmol/L (ref 3.5–5.1)
Sodium: 137 mmol/L (ref 135–145)
Sodium: 137 mmol/L (ref 135–145)

## 2022-03-27 LAB — TROPONIN I (HIGH SENSITIVITY)
Troponin I (High Sensitivity): 10 ng/L (ref <18)
Troponin I (High Sensitivity): 8 ng/L (ref <18)

## 2022-03-27 LAB — CK: Total CK: 392 U/L (ref 49–397)

## 2022-03-27 MED ORDER — SODIUM CHLORIDE 0.9 % IV BOLUS
1000.0000 mL | Freq: Once | INTRAVENOUS | Status: AC
  Administered 2022-03-27: 1000 mL via INTRAVENOUS

## 2022-03-27 MED ORDER — ACETAMINOPHEN 325 MG RE SUPP: 650.0000 mg | Freq: Four times a day (QID) | RECTAL | Status: DC | PRN

## 2022-03-27 MED ORDER — ACETAMINOPHEN 325 MG PO TABS: 650.0000 mg | ORAL_TABLET | Freq: Four times a day (QID) | ORAL | Status: DC | PRN

## 2022-03-27 MED ORDER — LACTATED RINGERS IV BOLUS
1000.0000 mL | Freq: Once | INTRAVENOUS | Status: AC
  Administered 2022-03-27: 1000 mL via INTRAVENOUS

## 2022-03-27 MED ORDER — IRBESARTAN 150 MG PO TABS
300.0000 mg | ORAL_TABLET | Freq: Every day | ORAL | Status: DC
  Filled 2022-03-27 (×2): qty 2

## 2022-03-27 MED ORDER — SULFAMETHOXAZOLE-TRIMETHOPRIM 800-160 MG PO TABS
1.0000 | ORAL_TABLET | Freq: Two times a day (BID) | ORAL | Status: DC
  Filled 2022-03-27: qty 1

## 2022-03-27 MED ORDER — ONDANSETRON HCL 4 MG PO TABS: 4.0000 mg | ORAL_TABLET | Freq: Four times a day (QID) | ORAL | Status: DC | PRN

## 2022-03-27 MED ORDER — PANTOPRAZOLE SODIUM 40 MG PO TBEC
80.0000 mg | DELAYED_RELEASE_TABLET | Freq: Every day | ORAL | Status: DC
  Filled 2022-03-27: qty 2

## 2022-03-27 MED ORDER — ONDANSETRON HCL 4 MG/2ML IJ SOLN: 4.0000 mg | Freq: Four times a day (QID) | INTRAMUSCULAR | Status: DC | PRN

## 2022-03-27 MED ORDER — TRAZODONE HCL 50 MG PO TABS: 50.0000 mg | ORAL_TABLET | Freq: Every evening | ORAL | Status: DC | PRN

## 2022-03-27 MED ORDER — ASPIRIN 81 MG PO TBEC
81.0000 mg | DELAYED_RELEASE_TABLET | Freq: Every day | ORAL | Status: DC
  Filled 2022-03-27: qty 1

## 2022-03-27 MED ORDER — BUPROPION HCL ER (XL) 150 MG PO TB24
300.0000 mg | ORAL_TABLET | Freq: Every day | ORAL | Status: DC
  Filled 2022-03-27: qty 2

## 2022-03-27 MED ORDER — ALPRAZOLAM 0.5 MG PO TABS
0.5000 mg | ORAL_TABLET | Freq: Every evening | ORAL | Status: DC | PRN
  Administered 2022-03-27: 0.5 mg via ORAL
  Filled 2022-03-27: qty 1

## 2022-03-27 MED ORDER — METOPROLOL TARTRATE 5 MG/5ML IV SOLN: 5.0000 mg | Freq: Four times a day (QID) | INTRAVENOUS | Status: DC | PRN

## 2022-03-27 MED ORDER — POLYETHYLENE GLYCOL 3350 17 G PO PACK: 17.0000 g | PACK | Freq: Every day | ORAL | Status: DC | PRN

## 2022-03-27 MED ORDER — TAMSULOSIN HCL 0.4 MG PO CAPS
0.4000 mg | ORAL_CAPSULE | Freq: Every day | ORAL | Status: DC
  Filled 2022-03-27: qty 1

## 2022-03-27 MED ORDER — ROSUVASTATIN CALCIUM 5 MG PO TABS
5.0000 mg | ORAL_TABLET | ORAL | Status: DC
  Filled 2022-03-27: qty 1

## 2022-03-27 MED ORDER — METOPROLOL SUCCINATE ER 50 MG PO TB24
50.0000 mg | ORAL_TABLET | Freq: Every day | ORAL | Status: DC
  Filled 2022-03-27: qty 1

## 2022-03-27 MED ORDER — OXYCODONE HCL 5 MG PO TABS: 5.0000 mg | ORAL_TABLET | ORAL | Status: DC | PRN

## 2022-03-27 NOTE — ED Notes (Signed)
Ct updated about iv access

## 2022-03-27 NOTE — ED Provider Notes (Signed)
[facility] Provider Note    Event Date/Time   First MD Initiated Contact with Patient 03/27/22 1402     (approximate)   History   low BP, Chest Pain, and Dizziness   HPI  Dustin [name] is a 66 y.o. male who presents to the ED for evaluation of low BP, Chest Pain, and Dizziness   I reviewed cardiology clinic visit from March.  History of CAD with chronically occluded OM1, otherwise moderate disease and no stenting in March LHC.  History of hypertension, HLD, GERD.   I review PCP visit from 3 days ago where patient was prescribed Bactrim due to chronic prostatitis and penile pain.  Patient presents to the ED for evaluation of dizziness, chest discomfort.  He reports his dysuria has improved on the Bactrim for the past couple days.  Denies any fevers or new lower abdominal pain.   Patient reports being out in the sun this morning noting some long-range target shooting outside.  He felt fine during this.  Afterwards, at lunch, he began developing presyncopal dizziness and substernal chest pressure and discomfort.  He also reports a "sore muscle" sensation to his thoracic back and neck that he has trouble describing.  No falls or trauma.  No syncopal episodes.  No abdominal pain frontally or emesis.  Since coming in here, laying supine and getting some fluids started he reports feeling better now.  Physical Exam   Triage Vital Signs: ED Triage Vitals  Enc Vitals Group     BP 03/27/22 1355 (!) 78/51     Pulse Rate 03/27/22 1355 79     Resp 03/27/22 1355 18     Temp 03/27/22 1355 97.9 F (36.6 C)     Temp Source 03/27/22 1355 Oral     SpO2 03/27/22 1355 92 %     Weight 03/27/22 1403 230 lb (104.3 kg)     Height 03/27/22 1403 '5\' 10"'$  (1.778 m)     Head Circumference --      Peak Flow --      Pain Score 03/27/22 1403 7     Pain Loc --      Pain Edu? --      Excl. in [place]? --     Most recent vital signs: Vitals:   03/27/22 1415 03/27/22 1430  BP:   (!) 94/53  Pulse: 71 76  Resp: 18 14  Temp:    SpO2: 93% 95%    General: Awake, no distress.  Pleasant and conversational. CV:  Good peripheral perfusion. RRR without appreciable murmurs Resp:  Normal effort.  CTA B Abd:  No distention.  Soft and benign MSK:  No deformity noted.  No signs of trauma to the back, no midline spinal tenderness Neuro:  No focal deficits appreciated. Cranial nerves II through XII intact 5/5 strength and sensation in all 4 extremities Other:     ED Results / Procedures / Treatments   Labs (all labs ordered are listed, but only abnormal results are displayed) Labs Reviewed  BASIC METABOLIC PANEL - Abnormal; Notable for the following components:      Result Value   CO2 21 (*)    Glucose, Bld 125 (*)    BUN 32 (*)    Creatinine, Ser 2.62 (*)    GFR, Estimated 26 (*)    All other components within normal limits  CBC  CK  TROPONIN I (HIGH SENSITIVITY)    EKG Sinus rhythm, rate of 79 bpm.  Normal axis and intervals.  No evidence of acute ischemia.  RADIOLOGY CXR interpreted by me without evidence of acute cardiopulmonary pathology.  Official radiology report(s): DG Chest 2 View  Result Date: 03/27/2022 CLINICAL DATA:  Chest pain, dizziness EXAM: CHEST - 2 VIEW COMPARISON:  07/30/2020 FINDINGS: The heart size and mediastinal contours are within normal limits. Both lungs are clear. The visualized skeletal structures are unremarkable. IMPRESSION: No active cardiopulmonary disease. Electronically Signed   By: Elmer Picker M.D.   On: 03/27/2022 14:36    PROCEDURES and INTERVENTIONS:  .1-3 Lead EKG Interpretation  Performed by: Vladimir Crofts, MD Authorized by: Vladimir Crofts, MD     Interpretation: normal     ECG rate:  80   ECG rate assessment: normal     Rhythm: sinus rhythm     Ectopy: none     Conduction: normal     Medications  sodium chloride 0.9 % bolus 1,000 mL (0 mLs Intravenous Stopped 03/27/22 1506)  lactated ringers bolus  1,000 mL (1,000 mLs Intravenous New Bag/Given 03/27/22 1505)     IMPRESSION / MDM / ASSESSMENT AND PLAN / ED COURSE  I reviewed the triage vital signs and the nursing notes.  Differential diagnosis includes, but is not limited to, ACS, PTX, PNA, muscle strain/spasm, PE, dissection, dehydration, AKI, medication side effect  {Patient presents with symptoms of an acute illness or injury that is potentially life-threatening.  66 year old male presents to the ED with nausea and chest discomfort after spending the morning out in the sun.  His symptoms are resolving with IV fluids and supine positioning, which makes me think dehydration is a possibility.  Renal dysfunction is significant worse than baseline with AKI on CKD.  His CBC is normal and first troponin is normal.  CXR without widened mediastinum or acutely concerning features.  Was planning to CTA his chest abdomen pelvis to look for aortic dissection, but his AKI on CKD precludes this.  We will provide some additional IV fluids, check his CK and reassess a metabolic panel to hopefully be able to CT him  Clinical Course as of 03/27/22 1520  Sat Mar 27, 2022  1519 Reassessed.  Continues to look well with improved maps and no more chest pain.  IV fluids ongoing.  We discussed worsening renal dysfunction and AKI.  Discussed dehydration and Bactrim contributing to this.  Discussed fluids, recheck metabolic panel to hopefully get a CT scan.  We discussed the possibility of requiring observation admission.  He is agreeable with plan of care.  Signed out to oncoming provider [DS]    Clinical Course User Index [DS] Vladimir Crofts, MD     FINAL CLINICAL IMPRESSION(S) / ED DIAGNOSES   Final diagnoses:  Other chest pain  Dizziness  Nausea     Rx / DC Orders   ED Discharge Orders     None        Note:  This document was prepared using Dragon voice recognition software and may include unintentional dictation errors.   Vladimir Crofts,  MD 03/27/22 [id number]

## 2022-03-27 NOTE — ED Notes (Signed)
Pt is sitting up in bed eating at this time. Pt requested to have his BP cuff removed to eat at this time.

## 2022-03-27 NOTE — H&P (Addendum)
History and Physical    DEMON VOLANTE DEY:[id number] DOB: 07/09/1956 DOA: 03/27/2022  PCP: Jerrol Banana., MD  Patient coming from: Home  I have personally briefly reviewed patient's old medical records in [place]  Chief Complaint: Near syncope  HPI: Dustin [name] is a 66 y.o. male with medical history significant of hypertension, hyperlipidemia, GERD, coronary artery disease, who was in his usual state of health until today.  He has had recent problems with chronic prostatitis and has been on Bactrim.  Today he woke up and went target practicing and was carrying weights and had no chest pain during that time.  He then went to the lunch, had 3 beers, tried to eat and then was not feeling like himself.  His friend got him home at which time he took his blood pressure which was 80s over 40s and was repeated.  He reports some lower abdominal discomfort where his pants are but denies specific chest pain.  He reports a little bit of tightness.  He comes to [facility] emergency room for feeling like he might pass out, low blood pressure and this chest tightness.  There he was found to have acute kidney injury with a creatinine of 2.62.  They wanted to get a CT angiogram to rule out aortic dissection however given this creatinine they were unable to.  They started IV fluid hydration and repeated this hoping to then be able to perform the CT angio that his creatinine was still 2.36 on recheck.  We are asked to admit to see if this continues to improve so that the CT angio can be ordered.  The patient has a history of coronary artery disease and his last visit with cardiology in March where he underwent cardiac catheterization.The patient underwent cardiac catheterization on 12/23/2021 which revealed chronically occluded small to moderate caliber OM1, 40% stenosis proximal-mid left circumflex, and 30% stenosis mid RCA.His troponins were negative x2, and his EKG was normal.   Review of  Systems: As per HPI otherwise 10 point review of systems negative.   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    Past Surgical History:  Procedure Laterality Date   ABDOMINAL EXPLORATION SURGERY  1978   APPENDECTOMY     at same time as lymph node dissection   ESOPHAGOGASTRODUODENOSCOPY  03/08    and colon   INGUINAL HERNIA REPAIR  child   bilateral   JOINT REPLACEMENT     LEFT HEART CATH AND CORONARY ANGIOGRAPHY Left 12/23/2021   Procedure: LEFT HEART CATH AND CORONARY ANGIOGRAPHY;  Surgeon: Isaias Cowman, MD;  Location: [facility];  Service: Cardiovascular;  Laterality: Left;   MOHS SURGERY Left nose   at [facility]--- 2013   prolactinoma excision  09/04   [facility]   right testicle removed  [place] Left 6/17   and biceps repair--[facility]   stress nuclear  negative   10/09   TONSILLECTOMY       reports that he quit smoking about 9 years ago. His smoking use included cigarettes. He has a 60.00 pack-year smoking history. He has never used smokeless tobacco. He reports that he does not currently use alcohol. He reports current  drug use. Drug: Marijuana.  Allergies  Allergen Reactions   Amlodipine Swelling    LE EDEMA   Varenicline Tartrate     Family History  Problem Relation Age of Onset   AAA (abdominal aortic aneurysm) Father    Brain cancer Father    Cancer Paternal Aunt        lung   Cancer Other        strong in family   Cancer Brother        melanoma   Cerebral aneurysm Paternal Grandfather    Osteoporosis Mother      Prior to Admission medications   Medication Sig Start Date End Date Taking? Authorizing Provider  ALPRAZolam Duanne Moron) 0.5 MG tablet Take 1 tablet (0.5 mg total) by mouth at bedtime as needed for  anxiety. 01/21/22  Yes Jerrol Banana., MD  aspirin EC 81 MG tablet Take 1 tablet (81 mg total) by mouth daily. Swallow whole. 12/23/21 12/23/22 Yes Paraschos, Alexander, MD  buPROPion (WELLBUTRIN XL) 300 MG 24 hr tablet Take 1 tablet (300 mg total) by mouth daily. 01/21/22  Yes Jerrol Banana., MD  meloxicam (MOBIC) 15 MG tablet Take 15 mg by mouth daily as needed (Arthritis).   Yes [provider]  metoprolol succinate (TOPROL-XL) 50 MG 24 hr tablet Take 50 mg by mouth daily. 10/03/21  Yes [provider]  Multiple Vitamin (MULTIVITAMIN) tablet Take 1 tablet by mouth daily.   Yes [provider]  omeprazole (PRILOSEC) 40 MG capsule TAKE ONE CAPSULE BY MOUTH TWICE A DAY 09/25/21  Yes Crecencio Mc, MD  rosuvastatin (CRESTOR) 5 MG tablet Take 5 mg by mouth every other day. 06/11/21  Yes [provider]  sulfamethoxazole-trimethoprim (BACTRIM DS) 800-160 MG tablet Take 1 tablet by mouth 2 (two) times daily. 03/24/22  Yes Jerrol Banana., MD  tamsulosin (FLOMAX) 0.4 MG CAPS capsule Take 0.4 mg by mouth daily. 11/23/21  Yes [provider]  valsartan (DIOVAN) 320 MG tablet Take 1 tablet (320 mg total) by mouth daily. 03/11/21 03/27/22 Yes Visser, Jacquelyn D, PA-C  Brimonidine Tartrate (LUMIFY) 0.025 % SOLN Place 1 drop into both eyes daily.    [provider]  OVER THE COUNTER MEDICATION Take 2 tablets by mouth daily. Super Beets Chewable    [provider]  torsemide (DEMADEX) 10 MG tablet Take 10 mg by mouth daily. Patient not taking: Reported on 03/27/2022 03/25/22   [provider]    Physical Exam: Vitals:   03/27/22 1646 03/27/22 1730 03/27/22 1800 03/27/22 1830  BP: (!) 146/82 117/73 132/69 107/61  Pulse: 71 71 70 64  Resp: '18 18 16 13  '$ Temp: 97.8 F (36.6 C)     TempSrc: Oral     SpO2: 97% 94% 95% 95%  Weight:      Height:        Constitutional: NAD, calm, comfortable Eyes: PERRL, lids and  conjunctivae normal ENMT: Mucous membranes are moist. Normal dentition.  Neck: normal, supple, no masses, no thyromegaly Respiratory: clear to auscultation bilaterally, no wheezing, no crackles. Normal respiratory effort. Cardiovascular: Regular rate and rhythm, no murmurs / rubs / gallops. No extremity edema.   Abdomen: no tenderness, no masses palpated. No hepatosplenomegaly. Bowel sounds positive.  Musculoskeletal: no clubbing / cyanosis. No joint deformity upper and lower extremities. Good ROM, no contractures. Normal muscle tone.  Skin: no rashes, lesions, ulcers. No induration Neurologic: Grossly normal Psychiatric: Normal judgment and insight. Alert and oriented  x 3. Normal mood.   Labs on Admission: I have personally reviewed following labs and imaging studies  CBC: Recent Labs  Lab 03/27/22 1359  WBC 7.1  HGB 15.6  HCT 45.9  MCV 91.8  PLT 428   Basic Metabolic Panel: Recent Labs  Lab 03/27/22 1359 03/27/22 1638  NA 137 137  K 4.7 4.8  CL 105 107  CO2 21* 24  GLUCOSE 125* 98  BUN 32* 32*  CREATININE 2.62* 2.36*  CALCIUM 8.9 8.4*   GFR: Estimated Creatinine Clearance: 36.7 mL/min (A) (by C-G formula based on SCr of 2.36 mg/dL (H)).  Cardiac Enzymes: Recent Labs  Lab 03/27/22 1359  CKTOTAL 392    Urine analysis:    Component Value Date/Time   COLORURINE yellow 11/29/2008 0910   APPEARANCEUR Clear 11/29/2008 0910   LABSPEC 1.010 11/29/2008 0910   PHURINE 6.0 11/29/2008 0910   HGBUR negative 11/29/2008 0910   BILIRUBINUR Negative 03/24/2022 0926   PROTEINUR Negative 03/24/2022 0926   UROBILINOGEN 0.2 03/24/2022 0926   UROBILINOGEN 0.2 11/29/2008 0910   NITRITE Negative 03/24/2022 0926   NITRITE negative 11/29/2008 0910   LEUKOCYTESUR Negative 03/24/2022 0926    Radiological Exams on Admission: DG Chest 2 View  Result Date: 03/27/2022 CLINICAL DATA:  Chest pain, dizziness EXAM: CHEST - 2 VIEW COMPARISON:  07/30/2020 FINDINGS: The heart size and  mediastinal contours are within normal limits. Both lungs are clear. The visualized skeletal structures are unremarkable. IMPRESSION: No active cardiopulmonary disease. Electronically Signed   By: Elmer Picker M.D.   On: 03/27/2022 14:36    EKG: Independently reviewed.  Reveals normal sinus rhythm, no acute ST or T wave changes.  Assessment/Plan Principal Problem:   Chest pain Active Problems:   Hyperlipidemia LDL goal <100   Essential hypertension, benign   GERD   History of tobacco abuse   Venous insufficiency of both lower extremities   B12 deficiency   PAD (peripheral artery disease) (HCC)   Depression  Chest pain/near syncope Unclear etiology.  Patient is initial BPs in the ER were quite low at 78/51.  This responded well to fluid resuscitation and his blood pressures are back to normal.  Patient has strong family history of AAA in his father.  Would love to rule out aortic dissection or AAA for this patient.  Acute kidney injury  Unclear etiology other than dehydration.  Patient has been on Bactrim for chronic UTI which may contribute as well. Continue aggressive IV hydration Avoid nephrotoxic agents Trend serum creatinine.  Chronic prostatitis Continue Bactrim Continue Flomax  Hyperlipidemia Continue Crestor  Hypertension Continue ARB Continue Toprol  Coronary artery disease Continue aspirin Continue beta-blocker Continue statin  GERD Continue PPI  History of tobacco use Patient has long since quit smoking this continues to be a risk factor for ongoing artery disease  Peripheral artery disease On aspirin  Depression/anxiety Continue Wellbutrin  DVT prophylaxis: SCD/Compression stockings Code Status: Full code  Family Communication: Wife at bedside Disposition Plan: Home Consults called: None Admission status: Observation patient is in observation status pending improvement in kidney function ability to rule out aortic disease.   Donnamae Jude MD [facility]  If 7PM-7AM, please contact night-coverage 03/27/2022, 7:11 PM

## 2022-03-27 NOTE — ED Triage Notes (Signed)
Pt to ED with fiance, pt complains of chest tightness and dizziness since 45 min ago. EKG shows NSR. Took BP at home today because felt dizzy and nauseous, BP was 80/48 which is not his normal.   Retaking BP now, BP is 78/51, MAP of 61. Pt states feels very dizzy.   Chest tightness is in mid epigastric area, EKG has been shown to MD, informed first nurse will need room asap.

## 2022-03-28 ENCOUNTER — Observation Stay: Payer: Medicare Other

## 2022-03-28 DIAGNOSIS — N411 Chronic prostatitis: Secondary | ICD-10-CM | POA: Diagnosis present

## 2022-03-28 DIAGNOSIS — N179 Acute kidney failure, unspecified: Secondary | ICD-10-CM | POA: Diagnosis not present

## 2022-03-28 DIAGNOSIS — R0789 Other chest pain: Secondary | ICD-10-CM | POA: Diagnosis not present

## 2022-03-28 DIAGNOSIS — N189 Chronic kidney disease, unspecified: Secondary | ICD-10-CM | POA: Diagnosis not present

## 2022-03-28 LAB — BASIC METABOLIC PANEL
Anion gap: 5 (ref 5–15)
BUN: 24 mg/dL — ABNORMAL HIGH (ref 8–23)
CO2: 24 mmol/L (ref 22–32)
Calcium: 8.6 mg/dL — ABNORMAL LOW (ref 8.9–10.3)
Chloride: 107 mmol/L (ref 98–111)
Creatinine, Ser: 1.51 mg/dL — ABNORMAL HIGH (ref 0.61–1.24)
GFR, Estimated: 50 mL/min — ABNORMAL LOW (ref >60)
Glucose, Bld: 101 mg/dL — ABNORMAL HIGH (ref 70–99)
Potassium: 4.2 mmol/L (ref 3.5–5.1)
Sodium: 136 mmol/L (ref 135–145)

## 2022-03-28 LAB — HIV ANTIBODY (ROUTINE TESTING W REFLEX): HIV Screen 4th Generation wRfx: NONREACTIVE

## 2022-03-28 MED ORDER — SODIUM CHLORIDE 0.9 % IV SOLN: INTRAVENOUS | Status: DC

## 2022-03-28 MED ORDER — VALSARTAN 320 MG PO TABS: ORAL_TABLET | ORAL | 5 refills | Status: DC

## 2022-03-28 MED ORDER — IOHEXOL 350 MG/ML SOLN
80.0000 mL | Freq: Once | INTRAVENOUS | Status: AC | PRN
  Administered 2022-03-28: 80 mL via INTRAVENOUS

## 2022-03-28 NOTE — Assessment & Plan Note (Signed)
Continue Crestor 

## 2022-03-28 NOTE — Assessment & Plan Note (Signed)
Patient has long since quit smoking this continues to be a risk factor for ongoing artery disease

## 2022-03-28 NOTE — Assessment & Plan Note (Signed)
Continue Bactrim Continue Flomax

## 2022-03-28 NOTE — Assessment & Plan Note (Signed)
Continue ARB Continue Toprol

## 2022-03-28 NOTE — Assessment & Plan Note (Signed)
Creatinine appears to be between 1.27 and 1.4.  Today was as high as 2.68. Unclear etiology other than dehydration.  Patient has been on Bactrim for chronic UTI which may be contributing as well. Continue aggressive IV hydration Avoid nephrotoxic agents Trend serum creatinine

## 2022-03-28 NOTE — Assessment & Plan Note (Signed)
-   Continue Wellbutrin 

## 2022-03-28 NOTE — Assessment & Plan Note (Signed)
Unclear etiology.  Patient's initial blood pressures in the ER were quite low at 78/51.  He responded well to fluid resuscitation as blood pressures are back to normal.  He has a strong family history of AAA in his father.  Would like to rule out aortic dissection or AAA for this patient

## 2022-03-28 NOTE — Assessment & Plan Note (Signed)
Continue PPI ?

## 2022-03-28 NOTE — Assessment & Plan Note (Signed)
On aspirin. °

## 2022-03-28 NOTE — Discharge Summary (Signed)
Physician Discharge Summary   Dustin [name]  male DOB: 20-Jun-1956  ESP:[id number]  PCP: Jerrol Banana., MD  Admit date: 03/27/2022 Discharge date: 03/28/2022  Admitted From: home Disposition:  home CODE STATUS: Full code  Hospital Course:  For full details, please see H&P, progress notes, consult notes and ancillary notes.  Briefly,  Dustin [name] is a 66 y.o. male with medical history significant of hypertension, coronary artery disease, who was in his usual state of health until the day of presentation.  He woke up and went target practicing and was carrying weights and had no chest pain during that time.  He then went to the lunch, had some alcohol, tried to eat and then was not feeling like himself.  His friend got him home at which time he took his blood pressure which was 80s over 40s.  He came to [facility] emergency room for feeling like he might pass out, low blood pressure and this chest tightness.  Near syncope Likely due to low BP.  initial BPs in the ER were quite low at 78/51.  This responded well to fluid resuscitation and his blood pressures are back to normal.    Mild chronic stable mural thrombus --Patient has strong family history of AAA in his father, so CTA dissection was ordered on admission, which was performed after Cr normalized the next day.  CTA c/a/p initially read as "No evidence of thoracoabdominal aortic aneurysm or dissection."  However, later addended to say "mild chronic stable mural thrombus."  Radiologist spoke to ED physician Dr. Vladimir Crofts about this addendum, and Dr. Tamala Julian kindly discussed this result with pt.  Pt will follow up with vascular surgery as outpatient.  Chest tightness, ACS ruled out --trop neg x2  Acute kidney injury on CKD 3a 2/2 dehydration and hypotension. --Cr 2.62 on presentation, baseline around 1.4.  Cr improved to 1.51 the next day after IVF.    Chronic prostatitis Urinary urgency has been on Bactrim for  about a week or 2 Continue Flomax --f/u with urology as outpatient   Hyperlipidemia Continue Crestor  Hypertension --not taking torsemide PTA --Continue Toprol --hold home valsartan until outpatient f/u due to AKI and hypotension  Coronary artery disease Continue aspirin Continue statin  GERD Continue PPI  History of tobacco use Patient has long since quit smoking this continues to be a risk factor for ongoing artery disease  Peripheral artery disease On aspirin  Depression/anxiety Continue Wellbutrin   Discharge Diagnoses:  Principal Problem:   Chest pain Active Problems:   Hyperlipidemia LDL goal <100   Essential hypertension, benign   GERD   History of tobacco abuse   B12 deficiency   PAD (peripheral artery disease) (HCC)   Depression   Coronary artery disease involving native coronary artery of native heart without angina pectoris   Acute kidney injury superimposed on chronic kidney disease ([place])   Chronic prostatitis     Discharge Instructions:  Allergies as of 03/28/2022       Reactions   Amlodipine Swelling   LE EDEMA   Varenicline Tartrate         Medication List     STOP taking these medications    torsemide 10 MG tablet Commonly known as: DEMADEX       TAKE these medications    ALPRAZolam 0.5 MG tablet Commonly known as: XANAX Take 1 tablet (0.5 mg total) by mouth at bedtime as needed for anxiety.   aspirin EC  81 MG tablet Take 1 tablet (81 mg total) by mouth daily. Swallow whole.   buPROPion 300 MG 24 hr tablet Commonly known as: WELLBUTRIN XL Take 1 tablet (300 mg total) by mouth daily.   Lumify 0.025 % Soln Generic drug: Brimonidine Tartrate Place 1 drop into both eyes daily.   meloxicam 15 MG tablet Commonly known as: MOBIC Take 15 mg by mouth daily as needed (Arthritis).   metoprolol succinate 50 MG 24 hr tablet Commonly known as: TOPROL-XL Take 50 mg by mouth daily.   multivitamin tablet Take 1 tablet by  mouth daily.   omeprazole 40 MG capsule Commonly known as: PRILOSEC TAKE ONE CAPSULE BY MOUTH TWICE A DAY   OVER THE COUNTER MEDICATION Take 2 tablets by mouth daily. Super Beets Chewable   rosuvastatin 5 MG tablet Commonly known as: CRESTOR Take 5 mg by mouth every other day.   sulfamethoxazole-trimethoprim 800-160 MG tablet Commonly known as: BACTRIM DS Take 1 tablet by mouth 2 (two) times daily.   tamsulosin 0.4 MG Caps capsule Commonly known as: FLOMAX Take 0.4 mg by mouth daily.   valsartan 320 MG tablet Commonly known as: Diovan Hold until followup with PCP due to acute kidney injury and low blood pressure. What changed:  how much to take how to take this when to take this additional instructions         Follow-up Information     Jerrol Banana., MD Follow up in 1 week(s).   Specialty: Family Medicine Contact information: [address] [place] [address] [place] [address] [phone]                 Allergies  Allergen Reactions   Amlodipine Swelling    LE EDEMA   Varenicline Tartrate      The results of significant diagnostics from this hospitalization (including imaging, microbiology, ancillary and laboratory) are listed below for reference.   Consultations:   Procedures/Studies: CT Angio Chest/Abd/Pel for Dissection W and/or Wo Contrast  Result Date: 03/28/2022 CLINICAL DATA:  Chest and back pain with dizziness and hypotension. Nausea. EXAM: CT ANGIOGRAPHY CHEST, ABDOMEN AND PELVIS TECHNIQUE: Non-contrast CT of the chest was initially obtained. Multidetector CT imaging through the chest, abdomen and pelvis was performed using the standard protocol during bolus administration of intravenous contrast. Multiplanar reconstructed images and MIPs were obtained and reviewed to evaluate the vascular anatomy. RADIATION DOSE REDUCTION: This exam was performed according to the departmental dose-optimization program which includes automated  exposure control, adjustment of the mA and/or kV according to patient size and/or use of iterative reconstruction technique. CONTRAST:  60m OMNIPAQUE IOHEXOL 350 MG/ML SOLN COMPARISON:  Cardiac CTA 09/14/2021 and CT abdomen/pelvis 12/09/2006 FINDINGS: CTA CHEST FINDINGS Cardiovascular: Heart is normal size. Suggestion of mild calcified plaque over the left lateral circumflex coronary artery. Thoracic aorta is normal in caliber. There is mild calcified plaque throughout the thoracic aorta. No evidence of thoracic aortic dissection or aneurysm. Visualized pulmonary arterial system is normal. Mediastinum/Nodes: No mediastinal or hilar adenopathy. Remaining mediastinal structures are normal. Lungs/Pleura: Lungs are well inflated without focal airspace consolidation or effusion. Minimal linear atelectasis or scarring over the posterior right base. Airways are normal. Musculoskeletal: No focal abnormality. Review of the MIP images confirms the above findings. CTA ABDOMEN AND PELVIS FINDINGS VASCULAR Aorta: Mild calcified plaque over the abdominal aorta which is otherwise normal in caliber. There is no evidence of aortic aneurysm or dissection. Celiac: Patent without evidence of aneurysm, dissection, vasculitis or significant stenosis.  SMA: Patent without evidence of aneurysm, dissection, vasculitis or significant stenosis. Renals: Both renal arteries are patent without evidence of aneurysm, dissection, vasculitis, fibromuscular dysplasia or significant stenosis. Small accessory right renal artery just inferior to the native renal artery. IMA: Not visualized. Inflow: Minimal calcified plaque over the iliac arteries. No evidence of stenosis or occlusion. Veins: No obvious venous abnormality within the limitations of this arterial phase study. Review of the MIP images confirms the above findings. NON-VASCULAR Hepatobiliary: Liver, gallbladder and biliary tree are normal. Pancreas: Normal. Spleen: Normal. Adrenals/Urinary  Tract: Adrenal glands are normal. Kidneys are normal in size without hydronephrosis or nephrolithiasis. Mild bilateral renal cortical scarring. Couple subcentimeter cysts over the mid to lower pole left kidney. Ureters and bladder are normal. Stomach/Bowel: Stomach and small bowel are normal. Appendix is not visualized. Colon is normal. Lymphatic: No adenopathy. Reproductive: Normal. Other: No free fluid or focal inflammatory change. Small umbilical hernia containing only peritoneal fat. Musculoskeletal: No focal abnormality. Right hip arthroplasty present. Review of the MIP images confirms the above findings. IMPRESSION: 1. No evidence of thoracoabdominal aortic aneurysm or dissection. 2. No acute findings in the chest, abdomen, or pelvis. 3. Aortic atherosclerosis. Suggestion of mild atherosclerotic plaque over the left lateral circumflex coronary artery. 4. Few subcentimeter left-sided renal cysts. Mild bilateral renal cortical scarring. 5. Small umbilical hernia containing only peritoneal fat. Electronically Signed   By: Marin Olp M.D.   On: 03/28/2022 09:35   DG Chest 2 View  Result Date: 03/27/2022 CLINICAL DATA:  Chest pain, dizziness EXAM: CHEST - 2 VIEW COMPARISON:  07/30/2020 FINDINGS: The heart size and mediastinal contours are within normal limits. Both lungs are clear. The visualized skeletal structures are unremarkable. IMPRESSION: No active cardiopulmonary disease. Electronically Signed   By: Elmer Picker M.D.   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 EXAM: MRI THORACIC WITHOUT AND WITH CONTRAST TECHNIQUE: Multiplanar and multiecho pulse sequences of the thoracic spine were obtained without and with intravenous contrast. CONTRAST:  75m GADAVIST GADOBUTROL 1 MMOL/ML IV SOLN COMPARISON:  MRI thoracic spine March 16, 2021. FINDINGS: Alignment:  Physiologic. Vertebrae: No fracture, evidence  of discitis, or bone lesion. Cord: No significant interval change of the cord expansion with increased T2 signal centrally from C7 through T2 without evidence of contrast enhancement extending, measuring approximately 3.7 cm in length and 0.8 cm in diameter. 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 Paraspinal and other soft tissues: Negative. Disc levels: No significant changes from prior MRI. C7-T1: Hypertrophic left facet degenerative changes resulting in moderate left neural foraminal narrowing. T1-2: Left posterolateral disc protrusion causing indentation of the thecal sac without significant spinal canal or neural foraminal stenosis. T2-3: Disc bulge causing indentation of the thecal sac without significant spinal canal or neural foraminal stenosis. T3-4: Right posterolateral disc protrusion causing flattening of the anterior cord contour. No spinal canal or neural foraminal stenosis. T4-5: Posterior disc protrusion causing flattening of the anterior cord contour without significant spinal canal or neural foraminal stenosis. T5-6: Left posterolateral disc protrusion causing flattening of the anterior cord contour without significant spinal canal or neural foraminal stenosis. T6-7: Right posterolateral disc protrusion causing indentation on the right anterolateral aspect of the cord without cord signal abnormality. No significant spinal canal or neural foraminal stenosis. T7-8: Small posterior disc protrusion. No  spinal canal or neural foraminal stenosis. T8-9: Left posterolateral disc protrusion causing mild mass effect on the left anterolateral aspect of the cord without cord signal abnormality. No significant spinal canal or neural foraminal stenosis. T9-10: Shallow disc bulge. No spinal canal or neural foraminal stenosis. T10-11: Shallow disc bulge. No spinal canal or neural foraminal stenosis. T11-12: Shallow disc  bulge and mild facet degenerative changes without significant spinal canal or neural foraminal stenosis. T12-L1: Mild facet degenerative changes. No spinal canal or neural foraminal stenosis. IMPRESSION: 1. No interval change of the focal cord expansion with increased T2 signal centrally extending from C7 through T2 without evidence of contrast enhancement, may represent syrinx. 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 2. Degenerative changes of the thoracic spine appear stable. Electronically Signed   By: Pedro Earls M.D.   On: 03/11/2022 17:04      Labs: BNP (last 3 results) No results for input(s): "BNP" in the last 8760 hours. Basic Metabolic Panel: Recent Labs  Lab 03/27/22 1359 03/27/22 1638 03/28/22 0613  NA 137 137 136  K 4.7 4.8 4.2  CL 105 107 107  CO2 21* 24 24  GLUCOSE 125* 98 101*  BUN 32* 32* 24*  CREATININE 2.62* 2.36* 1.51*  CALCIUM 8.9 8.4* 8.6*   Liver Function Tests: No results for input(s): "AST", "ALT", "ALKPHOS", "BILITOT", "PROT", "ALBUMIN" in the last 168 hours. No results for input(s): "LIPASE", "AMYLASE" in the last 168 hours. No results for input(s): "AMMONIA" in the last 168 hours. CBC: Recent Labs  Lab 03/27/22 1359  WBC 7.1  HGB 15.6  HCT 45.9  MCV 91.8  PLT 176   Cardiac Enzymes: Recent Labs  Lab 03/27/22 1359  CKTOTAL 392   BNP: Invalid input(s): "POCBNP" CBG: No results for input(s): "GLUCAP" in the last 168 hours. D-Dimer No results for input(s): "DDIMER" in the last 72 hours. Hgb A1c No results for input(s): "HGBA1C" in the last 72 hours. Lipid Profile No results for input(s): "CHOL", "HDL", "LDLCALC", "TRIG", "CHOLHDL", "LDLDIRECT" in the last 72 hours. Thyroid function studies No results for input(s): "TSH", "T4TOTAL", "T3FREE", "THYROIDAB" in the last 72 hours.  Invalid input(s): "FREET3" Anemia work up No results for  input(s): "VITAMINB12", "FOLATE", "FERRITIN", "TIBC", "IRON", "RETICCTPCT" in the last 72 hours. Urinalysis    Component Value Date/Time   COLORURINE yellow 11/29/2008 0910   APPEARANCEUR Clear 11/29/2008 0910   LABSPEC 1.010 11/29/2008 0910   PHURINE 6.0 11/29/2008 0910   HGBUR negative 11/29/2008 0910   BILIRUBINUR Negative 03/24/2022 0926   PROTEINUR Negative 03/24/2022 0926   UROBILINOGEN 0.2 03/24/2022 0926   UROBILINOGEN 0.2 11/29/2008 0910   NITRITE Negative 03/24/2022 0926   NITRITE negative 11/29/2008 0910   LEUKOCYTESUR Negative 03/24/2022 0926   Sepsis Labs Recent Labs  Lab 03/27/22 1359  WBC 7.1   Microbiology Recent Results (from the past 240 hour(s))  Urine Culture     Status: None   Collection Time: 03/19/22  3:23 AM   Specimen: Urine   UR  Result Value Ref Range Status   Urine Culture, Routine Final report  Final   Organism ID, Bacteria Comment  Final    Comment: Culture shows less than 10,000 colony forming units of bacteria per milliliter of urine. This colony count is not generally considered to be clinically significant.   CULTURE, URINE COMPREHENSIVE     Status: None (Preliminary result)   Collection  Time: 03/24/22  9:28 AM   Specimen: Urine   Urine  Result Value Ref Range Status   Urine Culture, Comprehensive Preliminary report  Preliminary   Organism ID, Bacteria Comment  Preliminary    Comment: Microbiological testing to rule out the presence of possible pathogens is in progress.      Total time spend on discharging this patient, including the last patient exam, discussing the hospital stay, instructions for ongoing care as it relates to all pertinent caregivers, as well as preparing the medical discharge records, prescriptions, and/or referrals as applicable, is 60 minutes.    Enzo Bi, MD  [facility] 03/28/2022, 9:50 AM

## 2022-03-28 NOTE — ED Notes (Signed)
CT called per MD to relay pt is cleared to have CT

## 2022-03-28 NOTE — ED Notes (Signed)
Pt transported to CT ?

## 2022-03-29 ENCOUNTER — Ambulatory Visit (INDEPENDENT_AMBULATORY_CARE_PROVIDER_SITE_OTHER): Payer: Medicare Other | Admitting: Family Medicine

## 2022-03-29 ENCOUNTER — Encounter: Payer: Self-pay | Admitting: Family Medicine

## 2022-03-29 ENCOUNTER — Other Ambulatory Visit: Payer: Self-pay | Admitting: Hospitalist

## 2022-03-29 ENCOUNTER — Telehealth: Payer: Self-pay | Admitting: Emergency Medicine

## 2022-03-29 VITALS — BP 151/81 | HR 67 | Resp 16 | Wt 230.0 lb

## 2022-03-29 DIAGNOSIS — R350 Frequency of micturition: Secondary | ICD-10-CM

## 2022-03-29 DIAGNOSIS — M79642 Pain in left hand: Secondary | ICD-10-CM

## 2022-03-29 DIAGNOSIS — I10 Essential (primary) hypertension: Secondary | ICD-10-CM

## 2022-03-29 DIAGNOSIS — I7410 Embolism and thrombosis of unspecified parts of aorta: Secondary | ICD-10-CM

## 2022-03-29 DIAGNOSIS — N1831 Chronic kidney disease, stage 3a: Secondary | ICD-10-CM

## 2022-03-29 DIAGNOSIS — M79641 Pain in right hand: Secondary | ICD-10-CM

## 2022-03-29 DIAGNOSIS — M791 Myalgia, unspecified site: Secondary | ICD-10-CM

## 2022-03-29 DIAGNOSIS — I779 Disorder of arteries and arterioles, unspecified: Secondary | ICD-10-CM

## 2022-03-29 DIAGNOSIS — M25551 Pain in right hip: Secondary | ICD-10-CM

## 2022-03-29 DIAGNOSIS — N401 Enlarged prostate with lower urinary tract symptoms: Secondary | ICD-10-CM

## 2022-03-29 NOTE — Patient Instructions (Addendum)
AVOID ALL NSAIDS!! USE VOLTAREN GEL FOR BILATERAL HAND PAIN.

## 2022-03-29 NOTE — Telephone Encounter (Signed)
I spoke with Dustin [name] this morning to let him know about the CTA addendum results regarding possible intramural thrombus to his aorta.  He was briefly admitted over the weekend and had a CTA performed that showed this.  Radiology had called me about this, and not the admitting hospitalist at the time of patient's discharge and he was not told of this at discharge because of the delayed addendum.  We discussed the chronic nature of this considering recent CTs in May.  We discussed outpatient vascular referral and I provided him the information for [place] [facility].  We discussed appropriate return precautions for the ED.

## 2022-03-29 NOTE — Progress Notes (Unsigned)
Established patient visit  I,Berlyn Malina,acting as a scribe for Wilhemena Durie, MD.,have documented all relevant documentation on the behalf of Wilhemena Durie, MD,as directed by  Wilhemena Durie, MD while in the presence of Wilhemena Durie, MD.   Patient: Dustin [name]   DOB: [birth year]   66 y.o. Male  MRN: [id number] Visit Date: 03/29/2022  Today's healthcare provider: Wilhemena Durie, MD   Chief Complaint  Patient presents with   Follow-up   Subjective    HPI  Patient is a 66 year old male who presents for follow up of chronic health from 2 months ago.  However, since that time he has recently been seen for dysuria/prostatitis and an ER visit for chest pain and dizziness. He was seen in the ER on 03/27/22.  At that time he reported his dysuria was improved on Bactrim.  He reported having dizziness and chest discomfort after being outside in the sun for long range target shooting.  While outside he said he felt fine but after his lunch he began having the symptoms.  He also reported having "sore muscles" in his thoracic back and neck.  Patient was given fluids while laying supine in the ER and reported feeling better.  His vital signs at that visit were within normal limits with the exception of a hypotensive reading of 78/51.  Kidney function was significantly changed and was admitted for observation and additional fluids.     Medications: Outpatient Medications Prior to Visit  Medication Sig   ALPRAZolam (XANAX) 0.5 MG tablet Take 1 tablet (0.5 mg total) by mouth at bedtime as needed for anxiety.   aspirin EC 81 MG tablet Take 1 tablet (81 mg total) by mouth daily. Swallow whole.   Brimonidine Tartrate (LUMIFY) 0.025 % SOLN Place 1 drop into both eyes daily.   buPROPion (WELLBUTRIN XL) 300 MG 24 hr tablet Take 1 tablet (300 mg total) by mouth daily.   meloxicam (MOBIC) 15 MG tablet Take 15 mg by mouth daily as needed (Arthritis).   metoprolol succinate  (TOPROL-XL) 50 MG 24 hr tablet Take 50 mg by mouth daily.   Multiple Vitamin (MULTIVITAMIN) tablet Take 1 tablet by mouth daily.   omeprazole (PRILOSEC) 40 MG capsule TAKE ONE CAPSULE BY MOUTH TWICE A DAY   OVER THE COUNTER MEDICATION Take 2 tablets by mouth daily. Super Beets Chewable   rosuvastatin (CRESTOR) 5 MG tablet Take 5 mg by mouth every other day.   sulfamethoxazole-trimethoprim (BACTRIM DS) 800-160 MG tablet Take 1 tablet by mouth 2 (two) times daily.   tamsulosin (FLOMAX) 0.4 MG CAPS capsule Take 0.4 mg by mouth daily.   valsartan (DIOVAN) 320 MG tablet Hold until followup with PCP due to acute kidney injury and low blood pressure. (Patient not taking: Reported on 03/29/2022)   No facility-administered medications prior to visit.    Review of Systems  {Labs  Heme  Chem  Endocrine  Serology  Results Review (optional):23779}   Objective    BP (!) 151/81 (BP Location: Right Arm, Patient Position: Sitting, Cuff Size: Large)   Pulse 67   Resp 16   Wt 230 lb (104.3 kg)   SpO2 99%   BMI 33.00 kg/m  {Show previous vital signs (optional):23777}  Physical Exam  ***  No results found for any visits on 03/29/22.  Assessment & Plan     1. Essential hypertension  - Renal Function Panel - CK (Creatine Kinase)  2.  Stage 3a chronic kidney disease (HCC)  - Renal Function Panel - CK (Creatine Kinase) - Ambulatory referral to Urology  3. Myalgia   4. Pain in both hands   5. Benign prostatic hyperplasia with urinary frequency  - Ambulatory referral to Nephrology  6. Right hip pain  - AMB referral to orthopedics  7. Aortic disease ([place])  - Ambulatory referral to Vascular Surgery   Return in about 3 weeks (around 04/19/2022).      {provider attestation***:1}   Wilhemena Durie, MD  [facility] [phone] (phone) [phone] (fax)  [place]

## 2022-04-01 LAB — CULTURE, URINE COMPREHENSIVE

## 2022-04-08 ENCOUNTER — Ambulatory Visit (INDEPENDENT_AMBULATORY_CARE_PROVIDER_SITE_OTHER): Payer: Medicare Other | Admitting: Urology

## 2022-04-08 ENCOUNTER — Encounter: Payer: Self-pay | Admitting: Urology

## 2022-04-08 VITALS — BP 108/70 | HR 79 | Ht 70.0 in | Wt 230.0 lb

## 2022-04-08 DIAGNOSIS — N401 Enlarged prostate with lower urinary tract symptoms: Secondary | ICD-10-CM

## 2022-04-08 LAB — BLADDER SCAN AMB NON-IMAGING: Scan Result: 46

## 2022-04-08 MED ORDER — SILODOSIN 8 MG PO CAPS: 8.0000 mg | ORAL_CAPSULE | Freq: Every day | ORAL | 0 refills | Status: DC

## 2022-04-08 NOTE — Progress Notes (Signed)
04/08/2022 1:50 PM   Dustin [name] [birth year] [id number]  Referring provider: Jerrol Banana., MD [address] [place],  [place] [address]  Chief Complaint  Patient presents with   Benign Prostatic Hypertrophy    HPI: Dustin [name] is a 66 y.o. male who presents for evaluation of BPH and prostatitis.  Saw Dr. 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  Loss of seminal emission postop Seen [facility] 02/25/2022 with a 41-monthhistory of intermittent dysuria and frequency.  Urinalysis showed trace leukocytes with negative microscopy PCP visit 03/19/2022 with complaints of penile pain, frequency and dysuria.  UA with 11-30 WBC and he was started on Septra DS.  Urine culture subsequently grew mixed flora Hospitalized 03/27/2022 for a near syncopal episode and dehydration.  Creatinine was 2.62 which returned to near baseline at 1.5 after hydration Remains on tamsulosin.  IPSS today 23/35  PMH: Past Medical History:  Diagnosis Date   Arthritis    Family history of cerebral aneurysm    History of colonic polyps    History of COVID-19 07/2020   Hypertension    [place]   [place]   teratocarcinoma    Surgical History: Past Surgical History:  Procedure Laterality Date   ABDOMINAL EXPLORATION SURGERY  1978   APPENDECTOMY     at same time as lymph node dissection   ESOPHAGOGASTRODUODENOSCOPY  03/08    and colon   INGUINAL HERNIA REPAIR  child   bilateral   JOINT REPLACEMENT     LEFT HEART CATH AND CORONARY ANGIOGRAPHY Left 12/23/2021   Procedure: LEFT HEART CATH AND CORONARY ANGIOGRAPHY;  Surgeon: PIsaias Cowman MD;  [facility];  Service: Cardiovascular;  Laterality: Left;   [facility]   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 If you have any questions, ask your nurse or doctor.          ALPRAZolam 0.5 MG tablet Commonly known as: XANAX Take 1 tablet (0.5 mg total) by mouth at bedtime as needed for anxiety.   aspirin EC 81 MG tablet Take 1 tablet (81 mg total) by mouth daily. Swallow whole.   buPROPion 300 MG 24 hr tablet Commonly known as: WELLBUTRIN XL Take 1 tablet (300 mg total) by mouth daily.   Lumify 0.025 % Soln Generic drug: Brimonidine Tartrate Place 1 drop into both eyes daily.   meloxicam 15 MG tablet Commonly known as: MOBIC Take 15 mg by mouth daily as needed (Arthritis).   metoprolol succinate 50 MG 24 hr tablet Commonly known as: TOPROL-XL Take 50 mg by mouth daily.   multivitamin tablet Take 1 tablet by mouth  daily.   omeprazole 40 MG capsule Commonly known as: PRILOSEC TAKE ONE CAPSULE BY MOUTH TWICE A DAY   OVER THE COUNTER MEDICATION Take 2 tablets by mouth daily. Super Beets Chewable   rosuvastatin 5 MG tablet Commonly known as: CRESTOR Take 5 mg by mouth every other day.   sulfamethoxazole-trimethoprim 800-160 MG tablet Commonly known as: BACTRIM DS Take 1 tablet by mouth 2 (two) times daily.   tamsulosin 0.4 MG Caps capsule Commonly known as: FLOMAX Take 0.4 mg by mouth daily.   valsartan 320 MG tablet Commonly known as: Diovan Hold until followup with PCP due to acute kidney injury and low blood pressure.        Allergies:  Allergies   Allergen Reactions   Amlodipine Swelling    LE EDEMA   Varenicline Tartrate     Family History: Family History  Problem Relation Age of Onset   AAA (abdominal aortic aneurysm) Father    Brain cancer Father    Cancer Paternal Aunt        lung   Cancer Other        strong in family   Cancer Brother        melanoma   Cerebral aneurysm Paternal Grandfather    Osteoporosis Mother     Social History:  reports that he quit smoking about 9 years ago. His smoking use included cigarettes. He has a 60.00 pack-year smoking history. He has never used smokeless tobacco. He reports that he does not currently use alcohol. He reports current drug use. Drug: Marijuana.   Physical Exam: BP 108/70   Pulse 79   Ht '5\' 10"'$  (1.778 m)   Wt 230 lb (104.3 kg)   BMI 33.00 kg/m   Constitutional:  Alert and oriented, No acute distress. HEENT: [place] AT Respiratory: Normal respiratory effort, no increased work of breathing. GU: Prostate 40 g, smooth without nodules Skin: No rashes, bruises or suspicious lesions. Neurologic: Grossly intact, no focal deficits, moving all 4 extremities. Psychiatric: Normal mood and affect.  Laboratory Data:  Urinalysis Dipstick/microscopy negative  Pertinent Imaging: CT angio chest/abdomen/pelvis 03/28/2022 showed no significant GU abnormalities   Assessment & Plan:    1. Benign prostatic hyperplasia with lower urinary tract symptoms, symptom details unspecified PVR 46 mL Still with a bothersome LUTS on tamsulosin though has seen some improvement.  Has a history of near syncope and we discussed titrating the tamsulosin to 0.8 mg though this could worsen syncope and orthostatic changes Trial of a prostate-specific alpha-blocker was discussed which he was interested in trying.  Rx silodosin 8 mg sent to pharmacy Adding a 5-ARI was also discussed however he was informed it would take 4-6 months to see efficacy He also had questions regarding minimally invasive outlet  procedures.  UroLift was discussed and if he was interested in pursuing that would need cystoscopy/TRUS He will call back regarding efficacy of silodosin A tentative 3-monthfollow-up was scheduled though if having worsening symptoms he was instructed to call back earlier   SAbbie Sons MD  [address] [place] [phone]

## 2022-04-09 ENCOUNTER — Encounter: Payer: Self-pay | Admitting: Urology

## 2022-04-09 LAB — MICROSCOPIC EXAMINATION: Bacteria, UA: NONE SEEN

## 2022-04-09 LAB — URINALYSIS, COMPLETE
Bilirubin, UA: NEGATIVE
Glucose, UA: NEGATIVE
Ketones, UA: NEGATIVE
Leukocytes,UA: NEGATIVE
Nitrite, UA: NEGATIVE
Protein,UA: NEGATIVE
RBC, UA: NEGATIVE
Specific Gravity, UA: 1.01 (ref 1.005–1.030)
Urobilinogen, Ur: 0.2 mg/dL (ref 0.2–1.0)
pH, UA: 5.5 (ref 5.0–7.5)

## 2022-04-19 ENCOUNTER — Ambulatory Visit (INDEPENDENT_AMBULATORY_CARE_PROVIDER_SITE_OTHER): Payer: Medicare Other | Admitting: Family Medicine

## 2022-04-19 VITALS — BP 122/81 | HR 68 | Temp 97.9°F | Wt 232.0 lb

## 2022-04-19 DIAGNOSIS — N1831 Chronic kidney disease, stage 3a: Secondary | ICD-10-CM

## 2022-04-19 DIAGNOSIS — I7410 Embolism and thrombosis of unspecified parts of aorta: Secondary | ICD-10-CM | POA: Diagnosis not present

## 2022-04-19 DIAGNOSIS — Z1211 Encounter for screening for malignant neoplasm of colon: Secondary | ICD-10-CM | POA: Diagnosis not present

## 2022-04-19 DIAGNOSIS — R350 Frequency of micturition: Secondary | ICD-10-CM

## 2022-04-19 DIAGNOSIS — I10 Essential (primary) hypertension: Secondary | ICD-10-CM

## 2022-04-19 DIAGNOSIS — R1319 Other dysphagia: Secondary | ICD-10-CM | POA: Diagnosis not present

## 2022-04-19 DIAGNOSIS — N401 Enlarged prostate with lower urinary tract symptoms: Secondary | ICD-10-CM

## 2022-04-19 NOTE — Progress Notes (Signed)
Established patient visit   Patient: Dustin [name]   DOB: 14-Jun-1956   66 y.o. Male  MRN: [id number] Visit Date: 04/19/2022  Today's healthcare provider: Wilhemena Durie, MD   No chief complaint on file.  Subjective    HPI  Patient comes in today for follow-up.  He is feeling well.  He is starting to ride an E bike on paths in the park.  He states his hips hurt too much for him to do any walking or running. Overall he is feeling fairly well.  He wants to lose some weight. He has an appointment with nephrology next month.  He has stopped NSAIDs. Hypertension, follow-up  BP Readings from Last 3 Encounters:  04/08/22 108/70  03/29/22 (!) 151/81  03/28/22 113/74   Wt Readings from Last 3 Encounters:  04/08/22 230 lb (104.3 kg)  03/29/22 230 lb (104.3 kg)  03/27/22 230 lb (104.3 kg)     He was last seen for hypertension 1 months ago.  BP at that visit was as above. Management since that visit includes none.  Use of agents associated with hypertension: none. He had been on Meloxicam but is no longer taking.    Outside blood pressures are not currently being checked. Symptoms: No chest pain No chest pressure  No palpitations No syncope  No dyspnea No orthopnea  No paroxysmal nocturnal dyspnea No lower extremity edema   Pertinent labs Lab Results  Component Value Date   CHOL 208 (H) 03/06/2021   HDL 45 03/06/2021   LDLCALC 117 (H) 03/06/2021   LDLDIRECT 129.0 05/10/2019   TRIG 262 (H) 03/06/2021   CHOLHDL 4.6 03/06/2021   Lab Results  Component Value Date   NA 136 03/28/2022   K 4.2 03/28/2022   CREATININE 1.51 (H) 03/28/2022   GFRNONAA 50 (L) 03/28/2022   GLUCOSE 101 (H) 03/28/2022   TSH 1.410 04/22/2020     The 10-year ASCVD risk score (Arnett DK, et al., 2019) is: 13.9%  ---------------------------------------------------------------------------------------------------   Medications: Outpatient Medications Prior to Visit  Medication Sig    ALPRAZolam (XANAX) 0.5 MG tablet Take 1 tablet (0.5 mg total) by mouth at bedtime as needed for anxiety.   aspirin EC 81 MG tablet Take 1 tablet (81 mg total) by mouth daily. Swallow whole.   Brimonidine Tartrate (LUMIFY) 0.025 % SOLN Place 1 drop into both eyes daily.   buPROPion (WELLBUTRIN XL) 300 MG 24 hr tablet Take 1 tablet (300 mg total) by mouth daily.   meloxicam (MOBIC) 15 MG tablet Take 15 mg by mouth daily as needed (Arthritis).   metoprolol succinate (TOPROL-XL) 50 MG 24 hr tablet Take 50 mg by mouth daily.   Multiple Vitamin (MULTIVITAMIN) tablet Take 1 tablet by mouth daily.   omeprazole (PRILOSEC) 40 MG capsule TAKE ONE CAPSULE BY MOUTH TWICE A DAY   OVER THE COUNTER MEDICATION Take 2 tablets by mouth daily. Super Beets Chewable   rosuvastatin (CRESTOR) 5 MG tablet Take 5 mg by mouth every other day.   silodosin (RAPAFLO) 8 MG CAPS capsule Take 1 capsule (8 mg total) by mouth daily with breakfast.   valsartan (DIOVAN) 320 MG tablet Hold until followup with PCP due to acute kidney injury and low blood pressure.   No facility-administered medications prior to visit.    Review of Systems      Objective     Vitals:   04/19/22 0945 04/19/22 0954  BP: (!) 154/84 122/81  Pulse: 68  Temp: 97.9 F (36.6 C)   TempSrc: Oral   SpO2: 95%   Weight: 232 lb (105.2 kg)      Physical Exam Vitals reviewed.  Constitutional:      General: He is not in acute distress.    Appearance: He is well-developed.  HENT:     Head: Normocephalic and atraumatic.     Right Ear: Hearing normal.     Left Ear: Hearing normal.     Nose: Nose normal.  Eyes:     General: Lids are normal. No scleral icterus.       Right eye: No discharge.        Left eye: No discharge.     Conjunctiva/sclera: Conjunctivae normal.  Cardiovascular:     Rate and Rhythm: Normal rate and regular rhythm.     Heart sounds: Normal heart sounds.  Pulmonary:     Effort: Pulmonary effort is normal. No respiratory  distress.  Skin:    Findings: No lesion or rash.  Neurological:     General: No focal deficit present.     Mental Status: He is alert and oriented to person, place, and time.  Psychiatric:        Mood and Affect: Mood normal.        Speech: Speech normal.        Behavior: Behavior normal.        Thought Content: Thought content normal.        Judgment: Judgment normal.        No results found for any visits on 04/19/22.  Assessment & Plan     1. Essential hypertension Blood pressure well controlled. I will see him back for review all medical problems in the fall. - Renal function panel  2. Other dysphagia Refer to GI for occasional food dysphagia. - Ambulatory referral to Gastroenterology  3. Colon cancer screening  - Ambulatory referral to Gastroenterology - Ambulatory referral to Gastroenterology  4. Stage 3a chronic kidney disease ([place]) Refer to nephrology already done.  He has appointment.  Avoid NSAIDs. - Renal function panel  5. Benign prostatic hyperplasia with urinary frequency Now on Rapaflo per Dr. Bernardo Heater from urology   No follow-ups on file.      I, Wilhemena Durie, MD, have reviewed all documentation for this visit. The documentation on 04/19/22 for the exam, diagnosis, procedures, and orders are all accurate and complete.    Dustin Bulthuis Cranford Mon, MD  [facility] [phone] (phone) [phone] (fax)  [place]

## 2022-04-20 ENCOUNTER — Other Ambulatory Visit: Payer: Self-pay

## 2022-04-20 DIAGNOSIS — Z1211 Encounter for screening for malignant neoplasm of colon: Secondary | ICD-10-CM

## 2022-04-20 DIAGNOSIS — R1310 Dysphagia, unspecified: Secondary | ICD-10-CM

## 2022-04-20 LAB — RENAL FUNCTION PANEL
Albumin: 4.4 g/dL (ref 3.9–4.9)
BUN/Creatinine Ratio: 13 (ref 10–24)
BUN: 17 mg/dL (ref 8–27)
CO2: 26 mmol/L (ref 20–29)
Calcium: 9.2 mg/dL (ref 8.6–10.2)
Chloride: 100 mmol/L (ref 96–106)
Creatinine, Ser: 1.34 mg/dL — ABNORMAL HIGH (ref 0.76–1.27)
Glucose: 103 mg/dL — ABNORMAL HIGH (ref 70–99)
Phosphorus: 3.4 mg/dL (ref 2.8–4.1)
Potassium: 4.6 mmol/L (ref 3.5–5.2)
Sodium: 141 mmol/L (ref 134–144)
eGFR: 58 mL/min/{1.73_m2} — ABNORMAL LOW (ref >59)

## 2022-04-20 MED ORDER — NA SULFATE-K SULFATE-MG SULF 17.5-3.13-1.6 GM/177ML PO SOLN: 354.0000 mL | Freq: Once | ORAL | 0 refills | Status: DC

## 2022-04-20 MED ORDER — NA SULFATE-K SULFATE-MG SULF 17.5-3.13-1.6 GM/177ML PO SOLN: 354.0000 mL | Freq: Once | ORAL | 0 refills | Status: AC

## 2022-04-20 NOTE — Progress Notes (Unsigned)
Gastroenterology Pre-Procedure Review  Request Date: 05/13/2022   Requesting Physician: Dr. Allen Norris  PATIENT REVIEW QUESTIONS: The patient responded to the following health history questions as indicated:    1. Are you having any GI issues? Dysphagia Per Dr. Allen Norris okay to schedule EGD  2. Do you have a personal history of Polyps? no 3. Do you have a family history of Colon Cancer or Polyps? no 4. Diabetes Mellitus? no 5. Joint replacements in the past 12 months?no 6. Major health problems in the past 3 months?no 7. Any artificial heart valves, MVP, or defibrillator?no    MEDICATIONS & ALLERGIES:    Patient reports the following regarding taking any anticoagulation/antiplatelet therapy:   Plavix, Coumadin, Eliquis, Xarelto, Lovenox, Pradaxa, Brilinta, or Effient? no Aspirin? Asprin '81mg'$    Patient confirms/reports the following medications:  Current Outpatient Medications  Medication Sig Dispense Refill   Na Sulfate-K Sulfate-Mg Sulf 17.5-3.13-1.6 GM/177ML SOLN Take 354 mLs by mouth once for 1 dose. 354 mL 0   ALPRAZolam (XANAX) 0.5 MG tablet Take 1 tablet (0.5 mg total) by mouth at bedtime as needed for anxiety. 20 tablet 3   aspirin EC 81 MG tablet Take 1 tablet (81 mg total) by mouth daily. Swallow whole. 150 tablet 2   Brimonidine Tartrate (LUMIFY) 0.025 % SOLN Place 1 drop into both eyes daily.     buPROPion (WELLBUTRIN XL) 300 MG 24 hr tablet Take 1 tablet (300 mg total) by mouth daily. 90 tablet 3   metoprolol succinate (TOPROL-XL) 50 MG 24 hr tablet Take 50 mg by mouth daily.     Multiple Vitamin (MULTIVITAMIN) tablet Take 1 tablet by mouth daily.     omeprazole (PRILOSEC) 40 MG capsule TAKE ONE CAPSULE BY MOUTH TWICE A DAY 180 capsule 1   OVER THE COUNTER MEDICATION Take 2 tablets by mouth daily. Super Beets Chewable     rosuvastatin (CRESTOR) 5 MG tablet Take 5 mg by mouth every other day.     silodosin (RAPAFLO) 8 MG CAPS capsule Take 1 capsule (8 mg total) by mouth daily with  breakfast. 30 capsule 0   valsartan (DIOVAN) 320 MG tablet Hold until followup with PCP due to acute kidney injury and low blood pressure. 30 tablet 5   No current facility-administered medications for this visit.    Patient confirms/reports the following allergies:  Allergies  Allergen Reactions   Amlodipine Swelling    LE EDEMA   Varenicline Tartrate     No orders of the defined types were placed in this encounter.   AUTHORIZATION INFORMATION Primary Insurance: 1D#: Group #:  Secondary Insurance: 1D#: Group #:  SCHEDULE INFORMATION: Date:  Time: Location:

## 2022-04-21 ENCOUNTER — Other Ambulatory Visit: Payer: Self-pay | Admitting: Urology

## 2022-05-02 ENCOUNTER — Other Ambulatory Visit: Payer: Self-pay | Admitting: Urology

## 2022-05-10 ENCOUNTER — Encounter: Payer: Self-pay | Admitting: Gastroenterology

## 2022-05-13 ENCOUNTER — Ambulatory Visit
Admission: RE | Admit: 2022-05-13 | Discharge: 2022-05-13 | Disposition: A | Discharge status: Home / Self Care | Payer: Medicare Other | Attending: Gastroenterology | Admitting: Gastroenterology

## 2022-05-13 ENCOUNTER — Ambulatory Visit (AMBULATORY_SURGERY_CENTER): Payer: Medicare Other | Admitting: Anesthesiology

## 2022-05-13 ENCOUNTER — Encounter: Payer: Self-pay | Admitting: Gastroenterology

## 2022-05-13 ENCOUNTER — Encounter
Admission: RE | Disposition: A | Discharge status: Home / Self Care | Payer: Self-pay | Source: Home / Self Care | Attending: Gastroenterology

## 2022-05-13 ENCOUNTER — Ambulatory Visit: Payer: Medicare Other | Admitting: Anesthesiology

## 2022-05-13 ENCOUNTER — Encounter (INDEPENDENT_AMBULATORY_CARE_PROVIDER_SITE_OTHER): Payer: Self-pay | Admitting: Vascular Surgery

## 2022-05-13 ENCOUNTER — Other Ambulatory Visit: Payer: Self-pay

## 2022-05-13 DIAGNOSIS — K635 Polyp of colon: Secondary | ICD-10-CM

## 2022-05-13 DIAGNOSIS — D123 Benign neoplasm of transverse colon: Secondary | ICD-10-CM | POA: Insufficient documentation

## 2022-05-13 DIAGNOSIS — I129 Hypertensive chronic kidney disease with stage 1 through stage 4 chronic kidney disease, or unspecified chronic kidney disease: Secondary | ICD-10-CM | POA: Insufficient documentation

## 2022-05-13 DIAGNOSIS — Z860100 Personal history of colon polyps, unspecified: Secondary | ICD-10-CM

## 2022-05-13 DIAGNOSIS — Z1211 Encounter for screening for malignant neoplasm of colon: Secondary | ICD-10-CM | POA: Insufficient documentation

## 2022-05-13 DIAGNOSIS — D122 Benign neoplasm of ascending colon: Secondary | ICD-10-CM | POA: Insufficient documentation

## 2022-05-13 DIAGNOSIS — K2289 Other specified disease of esophagus: Secondary | ICD-10-CM

## 2022-05-13 DIAGNOSIS — K640 First degree hemorrhoids: Secondary | ICD-10-CM | POA: Insufficient documentation

## 2022-05-13 DIAGNOSIS — N183 Chronic kidney disease, stage 3 unspecified: Secondary | ICD-10-CM | POA: Insufficient documentation

## 2022-05-13 DIAGNOSIS — Z79899 Other long term (current) drug therapy: Secondary | ICD-10-CM | POA: Insufficient documentation

## 2022-05-13 DIAGNOSIS — R1310 Dysphagia, unspecified: Secondary | ICD-10-CM | POA: Insufficient documentation

## 2022-05-13 DIAGNOSIS — K219 Gastro-esophageal reflux disease without esophagitis: Secondary | ICD-10-CM | POA: Diagnosis not present

## 2022-05-13 DIAGNOSIS — I2510 Atherosclerotic heart disease of native coronary artery without angina pectoris: Secondary | ICD-10-CM | POA: Diagnosis not present

## 2022-05-13 DIAGNOSIS — K5730 Diverticulosis of large intestine without perforation or abscess without bleeding: Secondary | ICD-10-CM | POA: Insufficient documentation

## 2022-05-13 DIAGNOSIS — Z87891 Personal history of nicotine dependence: Secondary | ICD-10-CM | POA: Insufficient documentation

## 2022-05-13 HISTORY — PX: ESOPHAGOGASTRODUODENOSCOPY (EGD) WITH PROPOFOL: SHX5813

## 2022-05-13 HISTORY — PX: COLONOSCOPY WITH PROPOFOL: SHX5780

## 2022-05-13 HISTORY — DX: Carpal tunnel syndrome, left upper limb: G56.02

## 2022-05-13 SURGERY — COLONOSCOPY WITH PROPOFOL
Anesthesia: General

## 2022-05-13 MED ORDER — SODIUM CHLORIDE 0.9 % IV SOLN: INTRAVENOUS | Status: DC

## 2022-05-13 MED ORDER — GLYCOPYRROLATE 0.2 MG/ML IJ SOLN
INTRAMUSCULAR | Status: DC | PRN
  Administered 2022-05-13: .2 mg via INTRAVENOUS

## 2022-05-13 MED ORDER — LACTATED RINGERS IV SOLN: INTRAVENOUS | Status: DC

## 2022-05-13 MED ORDER — LIDOCAINE HCL (CARDIAC) PF 100 MG/5ML IV SOSY
PREFILLED_SYRINGE | INTRAVENOUS | Status: DC | PRN
  Administered 2022-05-13: 100 mg via INTRAVENOUS

## 2022-05-13 MED ORDER — PROPOFOL 10 MG/ML IV BOLUS
INTRAVENOUS | Status: DC | PRN
  Administered 2022-05-13 (×6): 20 mg via INTRAVENOUS
  Administered 2022-05-13: 80 mg via INTRAVENOUS
  Administered 2022-05-13: 20 mg via INTRAVENOUS

## 2022-05-13 SURGICAL SUPPLY — 36 items
BALLN DILATOR 12-15 8 (BALLOONS)
BALLN DILATOR 15-18 8 (BALLOONS) ×3
BALLN DILATOR CRE 0-12 8 (BALLOONS)
BALLN DILATOR ESOPH 8 10 CRE (MISCELLANEOUS) IMPLANT
BALLOON DILATOR 12-15 8 (BALLOONS) IMPLANT
BALLOON DILATOR 15-18 8 (BALLOONS) IMPLANT
BALLOON DILATOR CRE 0-12 8 (BALLOONS) IMPLANT
BLOCK BITE 60FR ADLT L/F GRN (MISCELLANEOUS) ×3 IMPLANT
CLIP HMST 235XBRD CATH ROT (MISCELLANEOUS) IMPLANT
CLIP RESOLUTION 360 11X235 (MISCELLANEOUS)
ELECT REM PT RETURN 9FT ADLT (ELECTROSURGICAL)
ELECTRODE REM PT RTRN 9FT ADLT (ELECTROSURGICAL) IMPLANT
FCP ESCP3.2XJMB 240X2.8X (MISCELLANEOUS)
FORCEPS BIOP RAD 4 LRG CAP 4 (CUTTING FORCEPS) ×1 IMPLANT
FORCEPS BIOP RJ4 240 W/NDL (MISCELLANEOUS)
FORCEPS ESCP3.2XJMB 240X2.8X (MISCELLANEOUS) IMPLANT
GOWN CVR UNV OPN BCK APRN NK (MISCELLANEOUS) ×4 IMPLANT
GOWN ISOL THUMB LOOP REG UNIV (MISCELLANEOUS) ×6
INJECTOR VARIJECT VIN23 (MISCELLANEOUS) IMPLANT
KIT DEFENDO VALVE AND CONN (KITS) IMPLANT
KIT PRC NS LF DISP ENDO (KITS) ×2 IMPLANT
KIT PROCEDURE OLYMPUS (KITS) ×3
MANIFOLD NEPTUNE II (INSTRUMENTS) ×3 IMPLANT
MARKER SPOT ENDO TATTOO 5ML (MISCELLANEOUS) IMPLANT
PROBE APC STR FIRE (PROBE) IMPLANT
RETRIEVER NET PLAT FOOD (MISCELLANEOUS) IMPLANT
RETRIEVER NET ROTH 2.5X230 LF (MISCELLANEOUS) IMPLANT
SNARE COLD EXACTO (MISCELLANEOUS) IMPLANT
SNARE SHORT THROW 13M SML OVAL (MISCELLANEOUS) IMPLANT
SNARE SHORT THROW 30M LRG OVAL (MISCELLANEOUS) IMPLANT
SNARE SNG USE RND 15MM (INSTRUMENTS) IMPLANT
SYR INFLATION 60ML (SYRINGE) ×1 IMPLANT
TRAP ETRAP POLY (MISCELLANEOUS) IMPLANT
VARIJECT INJECTOR VIN23 (MISCELLANEOUS)
WATER STERILE IRR 250ML POUR (IV SOLUTION) ×3 IMPLANT
WIRE CRE 18-20MM 8CM F G (MISCELLANEOUS) IMPLANT

## 2022-05-13 NOTE — Transfer of Care (Signed)
Immediate Anesthesia Transfer of Care Note  Patient: Dustin [name]  Procedure(s) Performed: COLONOSCOPY WITH PROPOFOL ESOPHAGOGASTRODUODENOSCOPY (EGD) WITH PROPOFOL with balloon dilation (Bilateral)  Patient Location: PACU  Anesthesia Type:General  Level of Consciousness: awake, alert  and oriented  Airway & Oxygen Therapy: Patient Spontanous Breathing  Post-op Assessment: Report given to RN and Post -op Vital signs reviewed and stable  Post vital signs: Reviewed and stable  Last Vitals:  Vitals Value Taken Time  BP    Temp    Pulse    Resp    SpO2      Last Pain:  Vitals:   05/13/22 0906  TempSrc: Temporal  PainSc: 0-No pain         Complications: No notable events documented.

## 2022-05-13 NOTE — H&P (Signed)
Dustin Lame, MD [address] [place], [place] [address] Phone:[phone] Fax : [phone]  Primary Care Physician:  Jerrol Banana., MD Primary Gastroenterologist:  Dr. Allen Norris  Pre-Procedure History & Physical: HPI:  Dustin [name] is a 66 y.o. male is here for an endoscopy and colonoscopy.   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   Testicular cancer ([place]) 1978   teratocarcinoma    Past Surgical History:  Procedure Laterality Date   ABDOMINAL EXPLORATION SURGERY  1978   APPENDECTOMY     at same time as lymph node dissection   CARPAL TUNNEL RELEASE Right    ESOPHAGOGASTRODUODENOSCOPY  12/2006   and colon   INGUINAL HERNIA REPAIR  child   bilateral   JOINT REPLACEMENT     LEFT HEART CATH AND CORONARY ANGIOGRAPHY Left 12/23/2021   Procedure: LEFT HEART CATH AND CORONARY ANGIOGRAPHY;  Surgeon: Isaias Cowman, MD;  Location: [place] [facility];  Service: Cardiovascular;  Laterality: Left;   MOHS SURGERY Left nose   at [facility]--- 2013   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 Authorizing Provider  ALPRAZolam Duanne Moron) 0.5 MG tablet Take 1 tablet (0.5 mg total) by mouth at bedtime as needed for anxiety. 01/21/22  Yes Jerrol Banana., MD  aspirin EC 81 MG tablet Take 1 tablet (81 mg total) by mouth daily. Swallow whole. 12/23/21 12/23/22 Yes Paraschos, Alexander, MD  Brimonidine Tartrate (LUMIFY) 0.025 % SOLN Place 1 drop into both eyes daily.   Yes [provider]  buPROPion (WELLBUTRIN XL)  300 MG 24 hr tablet Take 1 tablet (300 mg total) by mouth daily. 01/21/22  Yes Jerrol Banana., MD  metoprolol succinate (TOPROL-XL) 50 MG 24 hr tablet Take 50 mg by mouth daily. 10/03/21  Yes [provider]  Multiple Vitamin (MULTIVITAMIN) tablet Take 1 tablet by mouth daily.   Yes [provider]  omeprazole (PRILOSEC) 40 MG capsule TAKE ONE CAPSULE BY MOUTH TWICE A DAY 09/25/21  Yes Crecencio Mc, MD  rosuvastatin (CRESTOR) 5 MG tablet Take 5 mg by mouth every other day. 06/11/21  Yes [provider]  sildenafil (REVATIO) 20 MG tablet Take 20 mg by mouth daily.   Yes [provider]  silodosin (RAPAFLO) 8 MG CAPS capsule TAKE ONE CAPSULE BY MOUTH DAILY WITH BREAKFAST 05/03/22  Yes Stoioff, Ronda Fairly, MD  valsartan (DIOVAN) 320 MG tablet Hold until followup with PCP due to acute kidney injury and low blood pressure. 03/28/22  Yes Enzo Bi, MD    Allergies as of 04/20/2022 - Review Complete 04/09/2022  Allergen Reaction Noted   Amlodipine Swelling 12/06/2016   Varenicline tartrate      Family History  Problem Relation Age of Onset   AAA (abdominal aortic aneurysm) Father    Brain cancer Father    Cancer Paternal Aunt        lung   Cancer Other  strong in family   Cancer Brother        melanoma   Cerebral aneurysm Paternal Grandfather    Osteoporosis Mother     Social History   Socioeconomic History   Marital status: Significant Other    Spouse name: Not on file   Number of children: 0   Years of education: Not on file   Highest education level: Not on file  Occupational History   Occupation: Sales/Marketing boats  Tobacco Use   Smoking status: Former    Packs/day: 1.50    Years: 40.00    Total pack years: 60.00    Types: Cigarettes    Quit date: 04/25/2012    Years since quitting: 10.0   Smokeless tobacco: Never  Vaping Use   Vaping Use: Never used  Substance and Sexual Activity   Alcohol use: Yes    Comment:  occasional   Drug use: Yes    Types: Marijuana    Comment: When BP is elevated. maybe twice a month   Sexual activity: Yes    Partners: Female  Other Topics Concern   Not on file  Social History Narrative             Social Determinants of Health   Financial Resource Strain: Low Risk  (07/06/2021)   Overall Financial Resource Strain (CARDIA)    Difficulty of Paying Living Expenses: Not hard at all  Food Insecurity: No Food Insecurity (07/06/2021)   Hunger Vital Sign    Worried About Running Out of Food in the Last Year: Never true    Ran Out of Food in the Last Year: Never true  Transportation Needs: No Transportation Needs (07/06/2021)   PRAPARE - Hydrologist (Medical): No    Lack of Transportation (Non-Medical): No  Physical Activity: Sufficiently Active (07/06/2021)   Exercise Vital Sign    Days of Exercise per Week: 3 days    Minutes of Exercise per Session: 50 min  Stress: No Stress Concern Present (07/06/2021)   [place]    Feeling of Stress : Only a little  Social Connections: Socially Isolated (07/06/2021)   Social Connection and Isolation Panel [NHANES]    Frequency of Communication with Friends and Family: More than three times a week    Frequency of Social Gatherings with Friends and Family: Once a week    Attends Religious Services: Never    Marine scientist or Organizations: No    Attends Archivist Meetings: Never    Marital Status: Divorced  Human resources officer Violence: Not At Risk (07/06/2021)   Humiliation, Afraid, Rape, and Kick questionnaire    Fear of Current or Ex-Partner: No    Emotionally Abused: No    Physically Abused: No    Sexually Abused: No    Review of Systems: See HPI, otherwise negative ROS  Physical Exam: BP 112/87   Pulse 75   Temp (!) 97.1 F (36.2 C) (Temporal)   Resp 20   Ht '5\' 10"'$  (1.778 m)   Wt 102.1 kg   SpO2 96%    BMI 32.28 kg/m  General:   Alert,  pleasant and cooperative in NAD Head:  Normocephalic and atraumatic. Neck:  Supple; no masses or thyromegaly. Lungs:  Clear throughout to auscultation.    Heart:  Regular rate and rhythm. Abdomen:  Soft, nontender and nondistended. Normal bowel sounds, without guarding, and without rebound.   Neurologic:  Alert  and  oriented x4;  grossly normal neurologically.  Impression/Plan: Dustin [name] is here for an endoscopy and colonoscopy to be performed for a history of adenomatous polyps on 2018 and dyspahgai   Risks, benefits, limitations, and alternatives regarding  endoscopy and colonoscopy have been reviewed with the patient.  Questions have been answered.  All parties agreeable.   Dustin Lame, MD  05/13/2022, 9:46 AM

## 2022-05-13 NOTE — Anesthesia Preprocedure Evaluation (Signed)
Anesthesia Evaluation  Patient identified by MRN, date of birth, ID band Patient awake    History of Anesthesia Complications Negative for: history of anesthetic complications  Airway Mallampati: III  TM Distance: >3 FB Neck ROM: Full    Dental no notable dental hx.    Pulmonary former smoker,    Pulmonary exam normal        Cardiovascular Exercise Tolerance: Good hypertension, Pt. on medications + CAD (LHC early 2023, moderate disease, no intervention)  Normal cardiovascular exam     Neuro/Psych    GI/Hepatic GERD  Medicated and Controlled,  Endo/Other  BMI 32  Renal/GU Renal disease (stage 3 CKD)     Musculoskeletal   Abdominal   Peds  Hematology   Anesthesia Other Findings   Reproductive/Obstetrics                             Anesthesia Physical Anesthesia Plan  ASA: 2  Anesthesia Plan: General   Post-op Pain Management: Minimal or no pain anticipated   Induction:   PONV Risk Score and Plan: 2 and TIVA, Propofol infusion and Treatment may vary due to age or medical condition  Airway Management Planned: Nasal Cannula and Natural Airway  Additional Equipment: None  Intra-op Plan:   Post-operative Plan:   Informed Consent: I have reviewed the patients History and Physical, chart, labs and discussed the procedure including the risks, benefits and alternatives for the proposed anesthesia with the patient or authorized representative who has indicated his/her understanding and acceptance.       Plan Discussed with: CRNA  Anesthesia Plan Comments:         Anesthesia Quick Evaluation

## 2022-05-13 NOTE — Op Note (Signed)
[facility] Gastroenterology Patient Name: Dustin [name] Procedure Date: 05/13/2022 9:44 AM MRN: [id number] Account #: [id number] Date of Birth: Oct 26, 1955 Admit Type: Outpatient Age: 66 Room: [facility] OR ROOM 01 Gender: Male Note Status: Finalized Instrument Name: [id number] Procedure:             Colonoscopy Indications:           High risk colon cancer surveillance: Personal history                         of colonic polyps Providers:             Lucilla Lame MD, MD Referring MD:          Janine Ores. Rosanna Randy, MD (Referring MD) Medicines:             Monitored Anesthesia Care, Propofol per Anesthesia Complications:         No immediate complications. Procedure:             Pre-Anesthesia Assessment:                        - Prior to the procedure, a History and Physical was                         performed, and patient medications and allergies were                         reviewed. The patient's tolerance of previous                         anesthesia was also reviewed. The risks and benefits                         of the procedure and the sedation options and risks                         were discussed with the patient. All questions were                         answered, and informed consent was obtained. Prior                         Anticoagulants: The patient has taken no previous                         anticoagulant or antiplatelet agents. ASA Grade                         Assessment: II - A patient with mild systemic disease.                         After reviewing the risks and benefits, the patient                         was deemed in satisfactory condition to undergo the                         procedure.  After obtaining informed consent, the colonoscope was                         passed under direct vision. Throughout the procedure,                         the patient's blood pressure, pulse, and oxygen                          saturations were monitored continuously. The                         Colonoscope was introduced through the anus and                         advanced to the the cecum, identified by appendiceal                         orifice and ileocecal valve. The colonoscopy was                         performed without difficulty. The patient tolerated                         the procedure well. The quality of the bowel                         preparation was good. Findings:      The perianal and digital rectal examinations were normal.      A 4 mm polyp was found in the ascending colon. The polyp was sessile.       The polyp was removed with a cold snare. Resection and retrieval were       complete.      A 5 mm polyp was found in the transverse colon. The polyp was sessile.       The polyp was removed with a cold snare. Resection and retrieval were       complete.      A few small-mouthed diverticula were found in the entire colon.      Non-bleeding internal hemorrhoids were found during retroflexion. The       hemorrhoids were Grade I (internal hemorrhoids that do not prolapse). Impression:            - One 4 mm polyp in the ascending colon, removed with                         a cold snare. Resected and retrieved.                        - One 5 mm polyp in the transverse colon, removed with                         a cold snare. Resected and retrieved.                        - Diverticulosis in the entire examined colon.                        - Non-bleeding  internal hemorrhoids. Recommendation:        - Discharge patient to home.                        - Resume previous diet.                        - Continue present medications.                        - Await pathology results.                        - Repeat colonoscopy in 7 years for surveillance. Procedure Code(s):     --- Professional ---                        [phone], Colonoscopy, flexible; with removal of                         tumor(s),  polyp(s), or other lesion(s) by snare                         technique Diagnosis Code(s):     --- Professional ---                        Z86.010, Personal history of colonic polyps                        K63.5, Polyp of colon CPT copyright 2019 American Medical Association. All rights reserved. The codes documented in this report are preliminary and upon coder review may  be revised to meet current compliance requirements. Lucilla Lame MD, MD 05/13/2022 10:13:44 AM This report has been signed electronically. Number of Addenda: 0 Note Initiated On: 05/13/2022 9:44 AM Scope Withdrawal Time: 0 hours 7 minutes 0 seconds  Total Procedure Duration: 0 hours 8 minutes 39 seconds  Estimated Blood Loss:  Estimated blood loss: none.      [facility]

## 2022-05-13 NOTE — Anesthesia Postprocedure Evaluation (Signed)
Anesthesia Post Note  Patient: Dustin [name]  Procedure(s) Performed: COLONOSCOPY WITH PROPOFOL ESOPHAGOGASTRODUODENOSCOPY (EGD) WITH PROPOFOL with balloon dilation (Bilateral)     Patient location during evaluation: PACU Anesthesia Type: General Level of consciousness: awake and alert Pain management: pain level controlled Vital Signs Assessment: post-procedure vital signs reviewed and stable Respiratory status: spontaneous breathing, nonlabored ventilation, respiratory function stable and patient connected to nasal cannula oxygen Cardiovascular status: blood pressure returned to baseline and stable Postop Assessment: no apparent nausea or vomiting Anesthetic complications: no   No notable events documented.  Adele Barthel Jadan Hinojos

## 2022-05-13 NOTE — Op Note (Signed)
[facility] Gastroenterology Patient Name: Dustin [name] Procedure Date: 05/13/2022 9:46 AM MRN: [id number] Account #: [id number] Date of Birth: 15-Sep-1956 Admit Type: Outpatient Age: 66 Room: [facility] OR ROOM 01 Gender: Male Note Status: Finalized Instrument Name: [id number] Procedure:             Upper GI endoscopy Indications:           Dysphagia Providers:             Lucilla Lame MD, MD Referring MD:          Janine Ores. Rosanna Randy, MD (Referring MD) Medicines:             Propofol per Anesthesia Complications:         No immediate complications. Procedure:             Pre-Anesthesia Assessment:                        - Prior to the procedure, a History and Physical was                         performed, and patient medications and allergies were                         reviewed. The patient's tolerance of previous                         anesthesia was also reviewed. The risks and benefits                         of the procedure and the sedation options and risks                         were discussed with the patient. All questions were                         answered, and informed consent was obtained. Prior                         Anticoagulants: The patient has taken no previous                         anticoagulant or antiplatelet agents. ASA Grade                         Assessment: II - A patient with mild systemic disease.                         After reviewing the risks and benefits, the patient                         was deemed in satisfactory condition to undergo the                         procedure.                        After obtaining informed consent, the endoscope was  passed under direct vision. Throughout the procedure,                         the patient's blood pressure, pulse, and oxygen                         saturations were monitored continuously. The Endoscope                         was introduced through the mouth,  and advanced to the                         second part of duodenum. The upper GI endoscopy was                         accomplished without difficulty. The patient tolerated                         the procedure well. Findings:      The Z-line was irregular and was found at the gastroesophageal junction.       A TTS dilator was passed through the scope. Dilation with a 15-16.5-18       mm balloon dilator was performed to 18 mm. The dilation site was       examined following endoscope reinsertion and showed no change.      The stomach was normal.      The examined duodenum was normal.      Two biopsies were obtained with cold forceps for histology in the middle       third of the esophagus. Impression:            - Z-line irregular, at the gastroesophageal junction.                         Dilated.                        - Normal stomach.                        - Normal examined duodenum.                        - Biopsy performed in the middle third of the                         esophagus. Recommendation:        - Discharge patient to home.                        - Resume previous diet.                        - Continue present medications.                        - Perform a colonoscopy today. Procedure Code(s):     --- Professional ---                        [phone], Esophagogastroduodenoscopy, flexible,  transoral; with transendoscopic balloon dilation of                         esophagus (less than 30 mm diameter)                        [id number], 59, Esophagogastroduodenoscopy, flexible,                         transoral; with biopsy, single or multiple Diagnosis Code(s):     --- Professional ---                        R13.10, Dysphagia, unspecified CPT copyright 2019 American Medical Association. All rights reserved. The codes documented in this report are preliminary and upon coder review may  be revised to meet current compliance requirements. Lucilla Lame  MD, MD 05/13/2022 10:02:24 AM This report has been signed electronically. Number of Addenda: 0 Note Initiated On: 05/13/2022 9:46 AM Total Procedure Duration: 0 hours 3 minutes 55 seconds  Estimated Blood Loss:  Estimated blood loss: none.      [facility]

## 2022-05-14 ENCOUNTER — Encounter: Payer: Self-pay | Admitting: Gastroenterology

## 2022-05-17 ENCOUNTER — Encounter: Payer: Self-pay | Admitting: Gastroenterology

## 2022-05-17 LAB — SURGICAL PATHOLOGY

## 2022-05-27 ENCOUNTER — Encounter: Payer: Self-pay | Admitting: Family Medicine

## 2022-06-01 NOTE — Telephone Encounter (Signed)
Patient scheduled an appt with [facility] on Friday morning.

## 2022-06-02 ENCOUNTER — Ambulatory Visit: Payer: Medicare Other | Admitting: Urology

## 2022-06-02 NOTE — Progress Notes (Signed)
MRN : [id number]  Dustin [name] is a 66 y.o. ([birth year]) male who presents with chief complaint of check circulation.  History of Present Illness:    The patient is seen for evaluation of painful lower extremities and diminished pulses. Patient notes the pain is always associated with activity and is very consistent day today. Typically, the pain occurs at less than one block, progress is as activity continues to the point that the patient must stop walking. Resting including standing still for several minutes allows the patient to walk a similar distance before being forced to stop again. Uneven terrain and inclines shorten the distance. The pain has been progressive over the past several years. The patient denies any abrupt changes in claudication symptoms.  The patient states the inability to walk is causing problems with daily activities.  The patient denies rest pain or dangling of an extremity off the side of the bed during the night for relief. No open wounds or sores at this time. No prior interventions or surgeries.  No history of back problems or DJD of the lumbar sacral spine.   The patient's blood pressure has been stable and relatively well controlled. The patient denies amaurosis fugax or recent TIA symptoms. There are no recent neurological changes noted. The patient denies history of DVT, PE or superficial thrombophlebitis. The patient denies recent episodes of angina or shortness of breath.    Review of CT angiogram shows a 4.2 cm ascending thoracic aortic aneurysm  and a 17 mm right common iliac artery aneurysm with diffuse atherosclerotic changes of the aorta and iliac arteries.  No outpatient medications have been marked as taking for the 06/03/22 encounter (Appointment) with Delana Meyer, Dolores Lory, MD.    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    Past Surgical History:  Procedure Laterality Date   ABDOMINAL EXPLORATION SURGERY  1978   APPENDECTOMY     at same time as lymph node dissection   CARPAL TUNNEL RELEASE Right    COLONOSCOPY WITH PROPOFOL N/A 05/13/2022   Procedure: COLONOSCOPY WITH PROPOFOL;  Surgeon: Lucilla Lame, MD;  Location: [place];  Service: Endoscopy;  Laterality: N/A;   ESOPHAGOGASTRODUODENOSCOPY  12/2006   and colon   ESOPHAGOGASTRODUODENOSCOPY (EGD) WITH PROPOFOL Bilateral 05/13/2022   Procedure: ESOPHAGOGASTRODUODENOSCOPY (EGD) WITH PROPOFOL with balloon dilation;  Surgeon: Lucilla Lame, MD;  Location: [place];  Service: Endoscopy;  Laterality: Bilateral;   INGUINAL HERNIA REPAIR  child   bilateral   JOINT REPLACEMENT     LEFT HEART CATH AND CORONARY ANGIOGRAPHY Left 12/23/2021   Procedure: LEFT HEART CATH AND CORONARY ANGIOGRAPHY;  Surgeon: Isaias Cowman, MD;  Location: [place] [facility];  Service: Cardiovascular;  Laterality: Left;   MOHS SURGERY Left nose   at [facility]--- 2013   prolactinoma excision  06/2003   [place]   right testicle removed  [place] Left 03/2016   and biceps repair--[facility]   stress nuclear  negative   10/09   TONSILLECTOMY      Social History Social History   Tobacco Use  Smoking status: Former    Packs/day: 1.50    Years: 40.00    Total pack years: 60.00    Types: Cigarettes    Quit date: 04/25/2012    Years since quitting: 10.1   Smokeless tobacco: Never  Vaping Use   Vaping Use: Never used  Substance Use Topics   Alcohol use: Yes    Comment: occasional   Drug use: Yes    Types: Marijuana    Comment: When BP is elevated. maybe twice a month    Family History Family History  Problem Relation Age of Onset   AAA (abdominal aortic aneurysm) Father     Brain cancer Father    Cancer Paternal Aunt        lung   Cancer Other        strong in family   Cancer Brother        melanoma   Cerebral aneurysm Paternal Grandfather    Osteoporosis Mother     Allergies  Allergen Reactions   Amlodipine Swelling    LE EDEMA   Sulfa Antibiotics     Low BP   Varenicline Tartrate     Irritability     REVIEW OF SYSTEMS (Negative unless checked)  Constitutional: '[]'$ Weight loss  '[]'$ Fever  '[]'$ Chills Cardiac: '[]'$ Chest pain   '[]'$ Chest pressure   '[]'$ Palpitations   '[]'$ Shortness of breath when laying flat   '[]'$ Shortness of breath with exertion. Vascular:  '[x]'$ Pain in legs with walking   '[]'$ Pain in legs at rest  '[]'$ History of DVT   '[]'$ Phlebitis   '[]'$ Swelling in legs   '[]'$ Varicose veins   '[]'$ Non-healing ulcers Pulmonary:   '[]'$ Uses home oxygen   '[]'$ Productive cough   '[]'$ Hemoptysis   '[]'$ Wheeze  '[]'$ COPD   '[]'$ Asthma Neurologic:  '[]'$ Dizziness   '[]'$ Seizures   '[]'$ History of stroke   '[]'$ History of TIA  '[]'$ Aphasia   '[]'$ Vissual changes   '[]'$ Weakness or numbness in arm   '[]'$ Weakness or numbness in leg Musculoskeletal:   '[]'$ Joint swelling   '[]'$ Joint pain   '[]'$ Low back pain Hematologic:  '[]'$ Easy bruising  '[]'$ Easy bleeding   '[]'$ Hypercoagulable state   '[]'$ Anemic Gastrointestinal:  '[]'$ Diarrhea   '[]'$ Vomiting  '[x]'$ Gastroesophageal reflux/heartburn   '[]'$ Difficulty swallowing. Genitourinary:  '[]'$ Chronic kidney disease   '[]'$ Difficult urination  '[]'$ Frequent urination   '[]'$ Blood in urine Skin:  '[]'$ Rashes   '[]'$ Ulcers  Psychological:  '[]'$ History of anxiety   '[]'$  History of major depression.  Physical Examination  There were no vitals filed for this visit. There is no height or weight on file to calculate BMI. Gen: WD/WN, NAD Head: /AT, No temporalis wasting.  Ear/Nose/Throat: Hearing grossly intact, nares w/o erythema or drainage Eyes: PER, EOMI, sclera nonicteric.  Neck: Supple, no masses.  No bruit or JVD.  Pulmonary:  Good air movement, no audible wheezing, no use of accessory muscles.  Cardiac: RRR, normal S1, S2,  no Murmurs. Vascular:  mild trophic changes, no open wounds Vessel Right Left  Radial Palpable Palpable  PT Not Palpable Not Palpable  DP Not Palpable Not Palpable  Gastrointestinal: soft, non-distended. No guarding/no peritoneal signs.  Musculoskeletal: M/S 5/5 throughout.  No visible deformity.  Neurologic: CN 2-12 intact. Pain and light touch intact in extremities.  Symmetrical.  Speech is fluent. Motor exam as listed above. Psychiatric: Judgment intact, Mood & affect appropriate for pt's clinical situation. Dermatologic: No rashes or ulcers noted.  No changes consistent with cellulitis.   CBC Lab Results  Component Value Date   WBC 7.1 03/27/2022   HGB 15.6  03/27/2022   HCT 45.9 03/27/2022   MCV 91.8 03/27/2022   PLT 176 03/27/2022    BMET    Component Value Date/Time   NA 141 04/19/2022 1044   NA 138 07/18/2014 1018   K 4.6 04/19/2022 1044   K 4.4 07/18/2014 1018   CL 100 04/19/2022 1044   CL 107 07/18/2014 1018   CO2 26 04/19/2022 1044   CO2 24 07/18/2014 1018   GLUCOSE 103 (H) 04/19/2022 1044   GLUCOSE 101 (H) 03/28/2022 0613   GLUCOSE 93 07/18/2014 1018   BUN 17 04/19/2022 1044   BUN 14 07/18/2014 1018   CREATININE 1.34 (H) 04/19/2022 1044   CREATININE 1.05 07/18/2014 1018   CALCIUM 9.2 04/19/2022 1044   CALCIUM 8.5 07/18/2014 1018   GFRNONAA 50 (L) 03/28/2022 0613   GFRNONAA >60 07/18/2014 1018   GFRNONAA >60 08/25/2012 0836   GFRAA 61 04/22/2020 0921   GFRAA >60 07/18/2014 1018   GFRAA >60 08/25/2012 0836   CrCl cannot be calculated (Patient's most recent lab result is older than the maximum 21 days allowed.).  COAG No results found for: "INR", "PROTIME"  Radiology No results found.   Assessment/Plan 1. Aneurysm of ascending aorta without rupture (HCC) Recommend:  No surgery or intervention is indicated at this time.  The patient has an asymptomatic thoracic aortic aneurysm that is less than 6.0 cm in maximal diameter.  I have discussed the  natural history of thoracic aortic aneurysm and the small risk of rupture for aneurysm less than 6.5 cm in size.  However, as these small aneurysms tend to enlarge over time, continued surveillance with CT scan is mandatory.   I have also discussed optimizing medical management with hypertension and lipid control and the importance of abstinence from tobacco.  The patient is also encouraged to exercise a minimum of 30 minutes 4 times a week.   Should the patient develop new onset chest or back pain or signs of peripheral embolization they are instructed to seek medical attention immediately and to alert the physician providing care that they have an aneurysm in the chest.   The patient voices their understanding.  The patient will return as ordered with a CT scan of the chest  - CT ANGIO CHEST AORTA W/CM & OR WO/CM; Future  2. Iliac artery aneurysm (HCC) Recommend: No surgery or intervention is indicated at this time.  The patient has an asymptomatic iliac artery aneurysm that is less than 2.0 cm in maximal diameter.    I have reviewed the natural history of iliac artery aneurysm and the small risk of rupture for aneurysm less than 2.5 cm in size.  However, as these small aneurysms tend to enlarge over time, continued surveillance with ultrasound or CT scan is mandatory.   I have also discussed optimizing medical management with hypertension and lipid control and the importance of abstinence from tobacco.  The patient is also encouraged to exercise a minimum of 30 minutes 4 times a week.   Should the patient develop new onset abdominal or back pain or signs of peripheral embolization they are instructed to seek medical attention immediately and to alert the physician providing care that they have an aneurysm.   The patient voices their understanding.  The patient will return in about 12 months with an aortic duplex.  - VAS US AORTA/IVC/ILIACS; Future  3. PAD (peripheral artery disease)  (HCC)  Recommend:  The patient has evidence of atherosclerosis of the lower extremities with claudication.  The patient does not voice lifestyle limiting changes at this point in time.  Noninvasive studies do not suggest clinically significant change.  No invasive studies, angiography or surgery at this time The patient should continue walking and begin a more formal exercise program.  The patient should continue antiplatelet therapy and aggressive treatment of the lipid abnormalities  No changes in the patient's medications at this time  Continued surveillance is indicated as atherosclerosis is likely to progress with time.    The patient will continue follow up with noninvasive studies as ordered.   - VAS US AORTA/IVC/ILIACS; Future  4. Venous insufficiency of both lower extremities No surgery or intervention at this point in time.    I have discussed with the patient venous insufficiency and why it  causes symptoms. I have discussed with the patient the chronic skin changes that accompany venous insufficiency and the long term sequela such as infection and ulceration.  Patient will begin wearing graduated compression stockings or compression wraps on a daily basis.  The patient will put the compression on first thing in the morning and removing them in the evening. The patient is instructed specifically not to sleep in the compression.    In addition, behavioral modification including several periods of elevation of the lower extremities during the day will be continued. I have demonstrated that proper elevation is a position with the ankles at heart level.  The patient is instructed to begin routine exercise, especially walking on a daily basis  The patient will be assessed for a Lymph Pump depending on the effectiveness of conservative therapy and the control of the associated lymphedema.   5. Essential hypertension, benign Continue antihypertensive medications as already ordered,  these medications have been reviewed and there are no changes at this time.   6. Coronary artery disease involving native coronary artery of native heart without angina pectoris Continue cardiac and antihypertensive medications as already ordered and reviewed, no changes at this time.  Continue statin as ordered and reviewed, no changes at this time  Nitrates PRN for chest pain   7. Gastroesophageal reflux disease, unspecified whether esophagitis present Continue PPI as already ordered, this medication has been reviewed and there are no changes at this time.  Avoidence of caffeine and alcohol  Moderate elevation of the head of the bed    8. Hyperlipidemia LDL goal <100 Continue statin as ordered and reviewed, no changes at this time     Hortencia Pilar, MD  06/02/2022 8:14 AM

## 2022-06-03 ENCOUNTER — Ambulatory Visit: Payer: BLUE CROSS/BLUE SHIELD | Admitting: Family Medicine

## 2022-06-03 ENCOUNTER — Ambulatory Visit (INDEPENDENT_AMBULATORY_CARE_PROVIDER_SITE_OTHER): Payer: Medicare Other | Admitting: Vascular Surgery

## 2022-06-03 ENCOUNTER — Other Ambulatory Visit: Payer: Self-pay | Admitting: Urology

## 2022-06-03 ENCOUNTER — Encounter (INDEPENDENT_AMBULATORY_CARE_PROVIDER_SITE_OTHER): Payer: Self-pay | Admitting: Vascular Surgery

## 2022-06-03 VITALS — BP 112/70 | HR 72 | Resp 17 | Ht 70.0 in | Wt 232.0 lb

## 2022-06-03 DIAGNOSIS — I7121 Aneurysm of the ascending aorta, without rupture: Secondary | ICD-10-CM

## 2022-06-03 DIAGNOSIS — I872 Venous insufficiency (chronic) (peripheral): Secondary | ICD-10-CM

## 2022-06-03 DIAGNOSIS — K219 Gastro-esophageal reflux disease without esophagitis: Secondary | ICD-10-CM

## 2022-06-03 DIAGNOSIS — E785 Hyperlipidemia, unspecified: Secondary | ICD-10-CM

## 2022-06-03 DIAGNOSIS — I723 Aneurysm of iliac artery: Secondary | ICD-10-CM | POA: Diagnosis not present

## 2022-06-03 DIAGNOSIS — I7410 Embolism and thrombosis of unspecified parts of aorta: Secondary | ICD-10-CM | POA: Diagnosis not present

## 2022-06-03 DIAGNOSIS — I739 Peripheral vascular disease, unspecified: Secondary | ICD-10-CM | POA: Diagnosis not present

## 2022-06-03 DIAGNOSIS — I2510 Atherosclerotic heart disease of native coronary artery without angina pectoris: Secondary | ICD-10-CM

## 2022-06-03 DIAGNOSIS — I10 Essential (primary) hypertension: Secondary | ICD-10-CM

## 2022-06-04 ENCOUNTER — Ambulatory Visit (INDEPENDENT_AMBULATORY_CARE_PROVIDER_SITE_OTHER): Payer: Medicare Other | Admitting: Urology

## 2022-06-04 ENCOUNTER — Encounter: Payer: Self-pay | Admitting: Urology

## 2022-06-04 VITALS — BP 110/69 | HR 73 | Ht 70.0 in | Wt 230.0 lb

## 2022-06-04 DIAGNOSIS — N401 Enlarged prostate with lower urinary tract symptoms: Secondary | ICD-10-CM

## 2022-06-04 DIAGNOSIS — R310 Gross hematuria: Secondary | ICD-10-CM

## 2022-06-04 LAB — URINALYSIS, COMPLETE
Bilirubin, UA: NEGATIVE
Glucose, UA: NEGATIVE
Ketones, UA: NEGATIVE
Leukocytes,UA: NEGATIVE
Nitrite, UA: NEGATIVE
Protein,UA: NEGATIVE
RBC, UA: NEGATIVE
Specific Gravity, UA: 1.02 (ref 1.005–1.030)
Urobilinogen, Ur: 0.2 mg/dL (ref 0.2–1.0)
pH, UA: 5 (ref 5.0–7.5)

## 2022-06-04 LAB — MICROSCOPIC EXAMINATION: Bacteria, UA: NONE SEEN

## 2022-06-04 NOTE — Progress Notes (Signed)
06/04/2022 8:34 AM   Dustin [name] 30-Jun-1956 [id number]  Referring provider: Jerrol Banana., MD [address] [place],  [place] [address]  Chief Complaint  Patient presents with   Hematuria    HPI: 66 y.o. male recently seen for BPH called for an acute visit for gross hematuria  Initially seen 04/08/2022 He was given a trial of silodosin and did not see any better efficacy in his lower urinary tract symptoms then tamsulosin Last week he was having increased frequency, urgency while traveling He stated he had to pinch the head of his penis to maintain continence and after his next void he noted blood in the toilet bowl which was only present for 1 void. 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    Surgical History: Past Surgical History:  Procedure Laterality Date   ABDOMINAL EXPLORATION SURGERY  1978   APPENDECTOMY     at same time as lymph node dissection   CARPAL TUNNEL RELEASE Right    COLONOSCOPY WITH PROPOFOL N/A 05/13/2022   Procedure: COLONOSCOPY WITH PROPOFOL;  Surgeon: Lucilla Lame, MD;  Location: [facility];  Service: Endoscopy;  Laterality: N/A;   ESOPHAGOGASTRODUODENOSCOPY  12/2006   and colon   ESOPHAGOGASTRODUODENOSCOPY (EGD) WITH PROPOFOL Bilateral 05/13/2022   Procedure: ESOPHAGOGASTRODUODENOSCOPY (EGD) WITH PROPOFOL with balloon dilation;  Surgeon: Lucilla Lame, MD;  Location: [facility];  Service: Endoscopy;  Laterality: Bilateral;   INGUINAL HERNIA REPAIR  child   bilateral   JOINT REPLACEMENT     LEFT HEART CATH AND CORONARY ANGIOGRAPHY Left  12/23/2021   Procedure: LEFT HEART CATH AND CORONARY ANGIOGRAPHY;  Surgeon: Isaias Cowman, MD;  Location: [facility];  Service: Cardiovascular;  Laterality: Left;   MOHS SURGERY Left nose   at [facility]--- 2013   prolactinoma excision  06/2003   [facility]   right testicle removed  [place] Left 03/2016   and biceps repair--[facility]   stress nuclear  negative   10/09   TONSILLECTOMY      Home Medications:  Allergies as of 06/04/2022       Reactions   Amlodipine Swelling   LE EDEMA   Sulfa Antibiotics    Low BP   Varenicline Tartrate    Irritability        Medication List        Accurate as of June 04, 2022  8:34 AM. If you have any questions, ask your nurse or doctor.          STOP taking these medications    aspirin EC 81 MG tablet Stopped by: Abbie Sons, MD       TAKE these medications    ALPRAZolam 0.5 MG tablet Commonly known as: XANAX Take 1 tablet (0.5 mg total) by mouth at bedtime as needed for anxiety.   buPROPion 300 MG 24 hr tablet Commonly known as: WELLBUTRIN XL Take 1 tablet (300 mg total) by mouth daily.   Lumify 0.025 % Soln Generic drug: Brimonidine Tartrate Place 1 drop into  both eyes daily.   metoprolol succinate 50 MG 24 hr tablet Commonly known as: TOPROL-XL Take 50 mg by mouth daily.   multivitamin tablet Take 1 tablet by mouth daily.   omeprazole 40 MG capsule Commonly known as: PRILOSEC TAKE ONE CAPSULE BY MOUTH TWICE A DAY   rosuvastatin 5 MG tablet Commonly known as: CRESTOR Take 5 mg by mouth every other day.   sildenafil 20 MG tablet Commonly known as: REVATIO Take 20 mg by mouth daily.   silodosin 8 MG Caps capsule Commonly known as: RAPAFLO TAKE ONE CAPSULE BY MOUTH DAILY WITH BREAKFAST   valsartan 320 MG tablet Commonly known as: Diovan Hold until followup with PCP due to acute kidney injury and low blood pressure.        Allergies:  Allergies   Allergen Reactions   Amlodipine Swelling    LE EDEMA   Sulfa Antibiotics     Low BP   Varenicline Tartrate     Irritability    Family History: Family History  Problem Relation Age of Onset   AAA (abdominal aortic aneurysm) Father    Brain cancer Father    Cancer Paternal Aunt        lung   Cancer Other        strong in family   Cancer Brother        melanoma   Cerebral aneurysm Paternal Grandfather    Osteoporosis Mother     Social History:  reports that he quit smoking about 10 years ago. His smoking use included cigarettes. He has a 60.00 pack-year smoking history. He has never used smokeless tobacco. He reports current alcohol use. He reports current drug use. Drug: Marijuana.   Physical Exam: BP 110/69   Pulse 73   Ht '5\' 10"'$  (1.778 m)   Wt 230 lb (104.3 kg)   BMI 33.00 kg/m   Constitutional:  Alert and oriented, No acute distress. HEENT: [place] AT Respiratory: Normal respiratory effort, no increased work of breathing. GU: Prostate 40 g, smooth without nodules Skin: No rashes, bruises or suspicious lesions. Neurologic: Grossly intact, no focal deficits, moving all 4 extremities. Psychiatric: Normal mood and affect.   Assessment & Plan:    1.  Gross hematuria Recent CT abdomen/pelvis with and without contrast showed no upper tract abnormalities Hematuria most likely secondary to BPH Recommend cystoscopy for lower tract evaluation.  He had also inquired about UroLift at last visit and this will determine if he is a candidate.  Prostate volume from recent CT was calculated today at 37 cc  2.  BPH with LUTS Cystoscopy as above   Abbie Sons, MD  [facility] [address], [place], [place] [address] [phone]

## 2022-06-04 NOTE — Patient Instructions (Signed)

## 2022-06-04 NOTE — Addendum Note (Signed)
Addended by: Kyra Manges on: 06/04/2022 09:20 AM   Modules accepted: Orders

## 2022-06-06 ENCOUNTER — Encounter (INDEPENDENT_AMBULATORY_CARE_PROVIDER_SITE_OTHER): Payer: Self-pay | Admitting: Vascular Surgery

## 2022-06-06 DIAGNOSIS — I723 Aneurysm of iliac artery: Secondary | ICD-10-CM | POA: Insufficient documentation

## 2022-06-06 DIAGNOSIS — I7121 Aneurysm of the ascending aorta, without rupture: Secondary | ICD-10-CM | POA: Insufficient documentation

## 2022-06-08 ENCOUNTER — Telehealth (INDEPENDENT_AMBULATORY_CARE_PROVIDER_SITE_OTHER): Payer: Self-pay | Admitting: Vascular Surgery

## 2022-06-16 ENCOUNTER — Encounter: Payer: Self-pay | Admitting: Urology

## 2022-06-16 ENCOUNTER — Ambulatory Visit (INDEPENDENT_AMBULATORY_CARE_PROVIDER_SITE_OTHER): Payer: Medicare Other | Admitting: Urology

## 2022-06-16 VITALS — BP 171/74 | HR 70 | Ht 70.0 in | Wt 225.0 lb

## 2022-06-16 DIAGNOSIS — N401 Enlarged prostate with lower urinary tract symptoms: Secondary | ICD-10-CM

## 2022-06-16 DIAGNOSIS — R310 Gross hematuria: Secondary | ICD-10-CM | POA: Diagnosis not present

## 2022-06-16 LAB — URINALYSIS, COMPLETE
Bilirubin, UA: NEGATIVE
Glucose, UA: NEGATIVE
Ketones, UA: NEGATIVE
Nitrite, UA: NEGATIVE
Protein,UA: NEGATIVE
RBC, UA: NEGATIVE
Specific Gravity, UA: 1.01 (ref 1.005–1.030)
Urobilinogen, Ur: 0.2 mg/dL (ref 0.2–1.0)
pH, UA: 5 (ref 5.0–7.5)

## 2022-06-16 LAB — MICROSCOPIC EXAMINATION: Bacteria, UA: NONE SEEN

## 2022-06-16 MED ORDER — MIRABEGRON ER 50 MG PO TB24: 50.0000 mg | ORAL_TABLET | Freq: Every day | ORAL | 0 refills | Status: DC

## 2022-06-16 MED ORDER — SILODOSIN 8 MG PO CAPS: ORAL_CAPSULE | ORAL | 3 refills | Status: DC

## 2022-06-16 NOTE — Progress Notes (Signed)
   06/16/22  CC:  Chief Complaint  Patient presents with   Cysto    HPI: Brief episode of hematuria which he felt was secondary to forceful squeezing of penis to prevent urination.  Also with bothersome LUTS and potentially interested in UroLift.  Prostate volume by see CT was 37 cc  Blood pressure (!) 171/74, pulse 70, height '5\' 10"'$  (1.778 m), weight 225 lb (102.1 kg). NED. A&Ox3.   No respiratory distress   Abd soft, NT, ND Normal phallus with bilateral descended testicles  Cystoscopy Procedure Note  Patient identification was confirmed, informed consent was obtained, and patient was prepped using Betadine solution.  Lidocaine jelly was administered per urethral meatus.     Pre-Procedure: - Inspection reveals a normal caliber urethral meatus.  Procedure: The flexible cystoscope was introduced without difficulty - No urethral strictures/lesions are present. -  Moderate lateral lobe enlargement  prostate  - Mild elevation bladder neck - Bilateral ureteral orifices identified - Bladder mucosa  reveals no ulcers, tumors, or lesions - No bladder stones - Mild trabeculation  Retroflexion shows no lesions or intravesical median lobe   Post-Procedure: - Patient tolerated the procedure well  Assessment/ Plan: No bladder mucosal abnormalities Moderate prostate enlargement Most bothersome symptom is urgency with urge incontinence He ran out of silodosin and his symptoms have worsened so he feels he is getting some benefit He has restarted silodosin. Myrbetriq 50 mg samples given and he will call back regarding efficacy He has a follow-up appointment scheduled January 2024    Abbie Sons, MD

## 2022-06-21 ENCOUNTER — Telehealth (INDEPENDENT_AMBULATORY_CARE_PROVIDER_SITE_OTHER): Payer: Self-pay | Admitting: Vascular Surgery

## 2022-06-21 NOTE — Telephone Encounter (Signed)
Spoke with pt regarding scheduling of CT ordered by Dr. Delana Meyer. I explained to patient that all information is in the message I sent via MyChart as the patient was driving. Pt acknowledged and stated that he will call us back to make CT results appt after the CT is scheduled. Nothing further is needed at this time.

## 2022-06-23 LAB — CULTURE, URINE COMPREHENSIVE

## 2022-06-24 ENCOUNTER — Other Ambulatory Visit: Payer: Self-pay | Admitting: *Deleted

## 2022-06-24 MED ORDER — DOXYCYCLINE HYCLATE 100 MG PO TABS: 100.0000 mg | ORAL_TABLET | Freq: Two times a day (BID) | ORAL | 0 refills | Status: DC

## 2022-07-15 ENCOUNTER — Encounter: Payer: Self-pay | Admitting: Urology

## 2022-07-16 ENCOUNTER — Telehealth: Payer: Self-pay

## 2022-07-16 ENCOUNTER — Ambulatory Visit
Admission: RE | Admit: 2022-07-16 | Discharge: 2022-07-16 | Disposition: A | Discharge status: Home / Self Care | Payer: Medicare Other | Source: Ambulatory Visit | Attending: Emergency Medicine | Admitting: Emergency Medicine

## 2022-07-16 VITALS — BP 146/81 | HR 66 | Temp 98.2°F | Resp 18

## 2022-07-16 DIAGNOSIS — R350 Frequency of micturition: Secondary | ICD-10-CM | POA: Diagnosis not present

## 2022-07-16 LAB — POCT URINALYSIS DIP (MANUAL ENTRY)
Bilirubin, UA: NEGATIVE
Blood, UA: NEGATIVE
Glucose, UA: NEGATIVE mg/dL
Ketones, POC UA: NEGATIVE mg/dL
Leukocytes, UA: NEGATIVE
Nitrite, UA: NEGATIVE
Protein Ur, POC: NEGATIVE mg/dL
Spec Grav, UA: 1.025 (ref 1.010–1.025)
Urobilinogen, UA: 0.2 E.U./dL
pH, UA: 5.5 (ref 5.0–8.0)

## 2022-07-16 NOTE — ED Triage Notes (Signed)
Patient presents to [facility] for UTI. Has a hx of UTI. Reports continued urinary freq x 6 months. Pt states he sees urology and was treated with doxy with no improvement. States last treatment 2 weeks ago with no improvement.

## 2022-07-16 NOTE — Telephone Encounter (Signed)
Copied from [place]. Topic: General - Inquiry >> Jul 16, 2022  9:14 AM Penni Bombard wrote: Reason for CRM: Pt called saying he is still having urinary frequency.  He went to the Urgent care this morning but really got no answers.  He would like to talk with someone about this. He is having to go before he can get to a restroom sometimes.  CB@  [phone]

## 2022-07-16 NOTE — Telephone Encounter (Signed)
Returned call to patient. Patient is requesting a referral to Urology. Patient has been referred to [facility], however patient is requesting a referral to a different provider. Please advise?

## 2022-07-16 NOTE — Discharge Instructions (Addendum)
Follow-up with your urologist.  

## 2022-07-16 NOTE — ED Provider Notes (Signed)
[facility]    CSN: [id number] Arrival date & time: 07/16/22  0806      History   Chief Complaint Chief Complaint  Patient presents with   Urinary Frequency    Have been treated for a bacterial urinary infection with doxycycline a couple weeks ago and did not work. Urination is more frequent than ever and an big inconvenience Dustin Affan BellJul 18, 1957 bd - Entered by patient    HPI Dustin [name] is a 66 y.o. male.   Patient presents with 6 month history of urinary frequency.  He reports occasional mild discomfort with urination which is relieved with increased water intake.  No fever, chills, abdominal pain, hematuria, or other symptoms.  Patient is followed by Urologist Dr. Bernardo Heater; last seen on 06/16/2022; he messaged his urologist yesterday with his concerns.  He was treated with doxycycline on 06/24/2022.  His medical history includes hypertension, CKD, BPH, chronic prostatitis, testicular cancer.   The history is provided by the patient and medical records.    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    Patient Active Problem List   Diagnosis Date Noted   Thoracic ascending aortic aneurysm ([place]) 06/06/2022   Iliac artery aneurysm ([place]) 06/06/2022   Personal history of colonic polyps    Polyp of transverse colon    Dysphagia    Acute kidney injury superimposed on chronic kidney disease ([place]) 03/28/2022   Chronic prostatitis 03/28/2022   Chest pain 03/27/2022   S/P cardiac catheterization 12/30/2021   Statin intolerance 12/30/2021   Patellofemoral stress syndrome 09/29/2021   Pain in thumb joint with movement of left hand 09/23/2021   Pain in thumb joint with movement, right 09/23/2021   Muscle weakness 05/06/2021    Coronary artery disease involving native coronary artery of native heart without angina pectoris 03/16/2021   Hypertension    Arthritis of carpometacarpal ([place]) joint of left thumb 02/05/2020   Arthritis of right hand 02/05/2020   Radial styloid tenosynovitis of left hand 02/05/2020   Radial styloid tenosynovitis of right hand 02/05/2020   Tendinopathy of right gluteus medius 02/06/2019   DDD (degenerative disc disease), lumbar 02/05/2019   Presence of right artificial hip joint 09/02/2018   Depression 08/15/2018   Testicular cancer ([place]) 08/15/2018   Anxiety 08/15/2018   Primary osteoarthritis of right hip 08/07/2018   Primary localized osteoarthrosis, hand 05/15/2018   Chronic hip pain, right 11/15/2017   BPH (benign prostatic hyperplasia) 08/20/2017   B12 deficiency 08/20/2017   Vitamin D deficiency 08/20/2017   Left carpal tunnel syndrome 07/13/2017   Right carpal tunnel syndrome 07/13/2017   Cubital tunnel syndrome on left 07/13/2017   Spinal cord tumor 07/04/2017   Ulnar neuropathy at elbow, 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   PAD (peripheral artery disease) ([place]) 06/29/2017   Carpal tunnel syndrome, bilateral upper limbs 06/15/2017   Venous insufficiency of both lower extremities 11/19/2016   Venous insufficiency of left lower extremity 11/19/2016   Edema 11/18/2016   History of tobacco abuse 11/18/2016   Polyarthritis 11/18/2016   Hemorrhoids 09/13/2016   FHx: AAA 06/28/2016   Actinic keratosis 12/29/2015   Left shoulder pain 12/29/2015   Cervical radiculitis 11/10/2015   Impingement syndrome of shoulder  region 11/10/2015   MMT (medial meniscus tear) 07/04/2014   Encounter for general adult medical examination with abnormal findings 10/26/2011   Hyperlipidemia LDL goal <100 09/22/2009   GERD 07/12/2008   ERECTILE DYSFUNCTION, 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   Essential hypertension, benign 06/08/2007   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   Pituitary microadenoma ([place]) 05/12/2003    Past Surgical  History:  Procedure Laterality Date   ABDOMINAL EXPLORATION SURGERY  1978   APPENDECTOMY     at same time as lymph node dissection   CARPAL TUNNEL RELEASE Right    COLONOSCOPY WITH PROPOFOL N/A 05/13/2022   Procedure: COLONOSCOPY WITH PROPOFOL;  Surgeon: Lucilla Lame, MD;  Location: [facility];  Service: Endoscopy;  Laterality: N/A;   ESOPHAGOGASTRODUODENOSCOPY  12/2006   and colon   ESOPHAGOGASTRODUODENOSCOPY (EGD) WITH PROPOFOL Bilateral 05/13/2022   Procedure: ESOPHAGOGASTRODUODENOSCOPY (EGD) WITH PROPOFOL with balloon dilation;  Surgeon: Lucilla Lame, MD;  Location: [facility];  Service: Endoscopy;  Laterality: Bilateral;   INGUINAL HERNIA REPAIR  child   bilateral   JOINT REPLACEMENT     LEFT HEART CATH AND CORONARY ANGIOGRAPHY Left 12/23/2021   Procedure: LEFT HEART CATH AND CORONARY ANGIOGRAPHY;  Surgeon: Isaias Cowman, MD;  Location: [facility];  Service: Cardiovascular;  Laterality: Left;   MOHS SURGERY Left nose   at [facility]--- 2013   prolactinoma excision  06/2003   [facility]   right testicle removed  [place] Left 03/2016   and biceps repair--[facility]   stress nuclear  negative   10/09   TONSILLECTOMY         Home Medications    Prior to Admission medications   Medication Sig Start Date End Date Taking? Authorizing Provider  ALPRAZolam Duanne Moron) 0.5 MG tablet Take 1 tablet (0.5 mg total) by mouth at bedtime as needed for anxiety. 01/21/22   Jerrol Banana., MD  Brimonidine Tartrate (LUMIFY) 0.025 % SOLN Place 1 drop into both eyes daily.    [provider]  buPROPion (WELLBUTRIN XL) 300 MG 24 hr tablet Take 1 tablet (300 mg total) by mouth daily. 01/21/22   Jerrol Banana., MD  doxycycline (VIBRA-TABS) 100 MG tablet Take 1 tablet (100 mg total) by mouth 2 (two) times daily. 06/24/22   Stoioff, Ronda Fairly, MD  metoprolol succinate (TOPROL-XL) 50 MG 24 hr tablet Take 50 mg by mouth daily.  10/03/21   [provider]  mirabegron ER (MYRBETRIQ) 50 MG TB24 tablet Take 1 tablet (50 mg total) by mouth daily. 06/16/22   Stoioff, Ronda Fairly, MD  Multiple Vitamin (MULTIVITAMIN) tablet Take 1 tablet by mouth daily.    [provider]  omeprazole (PRILOSEC) 40 MG capsule TAKE ONE CAPSULE BY MOUTH TWICE A DAY 09/25/21   Crecencio Mc, MD  rosuvastatin (CRESTOR) 5 MG tablet Take 5 mg by mouth every other day. 06/11/21   [provider]  sildenafil (REVATIO) 20 MG tablet Take 20 mg by mouth daily.    [provider]  silodosin (RAPAFLO) 8 MG CAPS capsule TAKE ONE CAPSULE BY MOUTH DAILY WITH BREAKFAST 06/16/22   Stoioff, Ronda Fairly, MD  valsartan (DIOVAN) 320 MG tablet Hold until followup with PCP due to acute kidney injury and low blood pressure. 03/28/22   Enzo Bi, MD    Family History Family History  Problem Relation Age of Onset   AAA (abdominal aortic aneurysm) Father    Brain cancer Father    Cancer Paternal  Aunt        lung   Cancer Other        strong in family   Cancer Brother        melanoma   Cerebral aneurysm Paternal Grandfather    Osteoporosis Mother     Social History Social History   Tobacco Use   Smoking status: Former    Packs/day: 1.50    Years: 40.00    Total pack years: 60.00    Types: Cigarettes    Quit date: 04/25/2012    Years since quitting: 10.2   Smokeless tobacco: Never  Vaping Use   Vaping Use: Never used  Substance Use Topics   Alcohol use: Yes    Comment: occasional   Drug use: Yes    Types: Marijuana    Comment: When BP is elevated. maybe twice a month     Allergies   Amlodipine, Sulfa antibiotics, and Varenicline tartrate   Review of Systems Review of Systems  Constitutional:  Negative for chills and fever.  Gastrointestinal:  Negative for abdominal pain, nausea and vomiting.  Genitourinary:  Positive for frequency. Negative for dysuria, flank pain, hematuria and penile discharge.  All other systems  reviewed and are negative.    Physical Exam Triage Vital Signs ED Triage Vitals  Enc Vitals Group     BP      Pulse      Resp      Temp      Temp src      SpO2      Weight      Height      Head Circumference      Peak Flow      Pain Score      Pain Loc      Pain Edu?      Excl. in [place]?    No data found.  Updated Vital Signs BP (!) 146/81   Pulse 66   Temp 98.2 F (36.8 C)   Resp 18   SpO2 96%   Visual Acuity Right Eye Distance:   Left Eye Distance:   Bilateral Distance:    Right Eye Near:   Left Eye Near:    Bilateral Near:     Physical Exam Vitals and nursing note reviewed.  Constitutional:      General: He is not in acute distress.    Appearance: Normal appearance. He is well-developed. He is not ill-appearing.  HENT:     Mouth/Throat:     Mouth: Mucous membranes are moist.  Cardiovascular:     Rate and Rhythm: Normal rate and regular rhythm.  Pulmonary:     Effort: Pulmonary effort is normal. No respiratory distress.  Abdominal:     General: Bowel sounds are normal.     Palpations: Abdomen is soft.     Tenderness: There is no abdominal tenderness. There is no right CVA tenderness, left CVA tenderness, guarding or rebound.  Musculoskeletal:     Cervical back: Neck supple.  Skin:    General: Skin is warm and dry.  Neurological:     Mental Status: He is alert.  Psychiatric:        Mood and Affect: Mood normal.        Behavior: Behavior normal.      UC Treatments / Results  Labs (all labs ordered are listed, but only abnormal results are displayed) Labs Reviewed  POCT URINALYSIS DIP (MANUAL ENTRY)    EKG   Radiology No results found.  Procedures Procedures (including critical care time)  Medications Ordered in UC Medications - No data to display  Initial Impression / Assessment and Plan / UC Course  I have reviewed the triage vital signs and the nursing notes.  Pertinent labs & imaging results that were available during my  care of the patient were reviewed by me and considered in my medical decision making (see chart for details).   Urinary frequency.  Urine normal.  Education provided on urinary frequency.  Instructed patient to follow up with urology.  He agrees to plan of care.    Final Clinical Impressions(s) / UC Diagnoses   Final diagnoses:  Urinary frequency     Discharge Instructions      Follow up with your urologist.      ED Prescriptions   None    PDMP not reviewed this encounter.   Sharion Balloon, NP 07/16/22 [phone]

## 2022-07-16 NOTE — Telephone Encounter (Signed)
Copied from [place]. Topic: General - Inquiry >> Jul 16, 2022  9:14 AM Penni Bombard wrote: Reason for CRM: Pt called saying he is still having urinary frequency.  He went to the Urgent care this morning but really got no answers.  He would like to talk with someone about this. He is having to go before he can get to a restroom sometimes.  CB@  [phone]

## 2022-07-19 ENCOUNTER — Ambulatory Visit (INDEPENDENT_AMBULATORY_CARE_PROVIDER_SITE_OTHER): Payer: Medicare Other

## 2022-07-19 ENCOUNTER — Other Ambulatory Visit: Payer: Self-pay | Admitting: Physician Assistant

## 2022-07-19 VITALS — Ht 70.0 in | Wt 230.0 lb

## 2022-07-19 DIAGNOSIS — Z0000 Encounter for general adult medical examination without abnormal findings: Secondary | ICD-10-CM

## 2022-07-19 DIAGNOSIS — N401 Enlarged prostate with lower urinary tract symptoms: Secondary | ICD-10-CM

## 2022-07-19 NOTE — Telephone Encounter (Signed)
No preference

## 2022-07-19 NOTE — Progress Notes (Signed)
Pt asking for a referral to a diff urology office

## 2022-07-19 NOTE — Patient Instructions (Signed)
Mr. Dustin [name] , Thank you for taking time to come for your Medicare Wellness Visit. I appreciate your ongoing commitment to your health goals. Please review the following plan we discussed and let me know if I can assist you in the future.   Screening recommendations/referrals: Colonoscopy: 05/13/22 Recommended yearly ophthalmology/optometry visit for glaucoma screening and checkup Recommended yearly dental visit for hygiene and checkup  Vaccinations: Influenza vaccine: n/c Pneumococcal vaccine: n/d Tdap vaccine: 06/12/14 Shingles vaccine: n/d   Covid-19: 01/10/20, 02/07/20  Advanced directives: no  Conditions/risks identified: none  Next appointment: Follow up in one year for your annual wellness visit. 07/21/23 @ 8:15 am by phone  Preventive Care 67 Years and Older, Male Preventive care refers to lifestyle choices and visits with your health care provider that can promote health and wellness. What does preventive care include? A yearly physical exam. This is also called an annual well check. Dental exams once or twice a year. Routine eye exams. Ask your health care provider how often you should have your eyes checked. Personal lifestyle choices, including: Daily care of your teeth and gums. Regular physical activity. Eating a healthy diet. Avoiding tobacco and drug use. Limiting alcohol use. Practicing safe sex. Taking low doses of aspirin every day. Taking vitamin and mineral supplements as recommended by your health care provider. What happens during an annual well check? The services and screenings done by your health care provider during your annual well check will depend on your age, overall health, lifestyle risk factors, and family history of disease. Counseling  Your health care provider may ask you questions about your: Alcohol use. Tobacco use. Drug use. Emotional well-being. Home and relationship well-being. Sexual activity. Eating habits. History of falls. Memory and  ability to understand (cognition). Work and work Statistician. Screening  You may have the following tests or measurements: Height, weight, and BMI. Blood pressure. Lipid and cholesterol levels. These may be checked every 5 years, or more frequently if you are over 18 years old. Skin check. Lung cancer screening. You may have this screening every year starting at age 17 if you have a 30-pack-year history of smoking and currently smoke or have quit within the past 15 years. Fecal occult blood test (FOBT) of the stool. You may have this test every year starting at age 45. Flexible sigmoidoscopy or colonoscopy. You may have a sigmoidoscopy every 5 years or a colonoscopy every 10 years starting at age 67. Prostate cancer screening. Recommendations will vary depending on your family history and other risks. Hepatitis C blood test. Hepatitis B blood test. Sexually transmitted disease (STD) testing. Diabetes screening. This is done by checking your blood sugar (glucose) after you have not eaten for a while (fasting). You may have this done every 1-3 years. Abdominal aortic aneurysm (AAA) screening. You may need this if you are a current or former smoker. Osteoporosis. You may be screened starting at age 41 if you are at high risk. Talk with your health care provider about your test results, treatment options, and if necessary, the need for more tests. Vaccines  Your health care provider may recommend certain vaccines, such as: Influenza vaccine. This is recommended every year. Tetanus, diphtheria, and acellular pertussis (Tdap, Td) vaccine. You may need a Td booster every 10 years. Zoster vaccine. You may need this after age 45. Pneumococcal 13-valent conjugate (PCV13) vaccine. One dose is recommended after age 2. Pneumococcal polysaccharide (PPSV23) vaccine. One dose is recommended after age 44. Talk to your health care provider  about which screenings and vaccines you need and how often you need  them. This information is not intended to replace advice given to you by your health care provider. Make sure you discuss any questions you have with your health care provider. Document Released: 10/24/2015 Document Revised: 06/16/2016 Document Reviewed: 07/29/2015 Elsevier Interactive Patient Education  2017 [place] Prevention in the Home Falls can cause injuries. They can happen to people of all ages. There are many things you can do to make your home safe and to help prevent falls. What can I do on the outside of my home? Regularly fix the edges of walkways and driveways and fix any cracks. Remove anything that might make you trip as you walk through a door, such as a raised step or threshold. Trim any bushes or trees on the path to your home. Use bright outdoor lighting. Clear any walking paths of anything that might make someone trip, such as rocks or tools. Regularly check to see if handrails are loose or broken. Make sure that both sides of any steps have handrails. Any raised decks and porches should have guardrails on the edges. Have any leaves, snow, or ice cleared regularly. Use sand or salt on walking paths during winter. Clean up any spills in your garage right away. This includes oil or grease spills. What can I do in the bathroom? Use night lights. Install grab bars by the toilet and in the tub and shower. Do not use towel bars as grab bars. Use non-skid mats or decals in the tub or shower. If you need to sit down in the shower, use a plastic, non-slip stool. Keep the floor dry. Clean up any water that spills on the floor as soon as it happens. Remove soap buildup in the tub or shower regularly. Attach bath mats securely with double-sided non-slip rug tape. Do not have throw rugs and other things on the floor that can make you trip. What can I do in the bedroom? Use night lights. Make sure that you have a light by your bed that is easy to reach. Do not use  any sheets or blankets that are too big for your bed. They should not hang down onto the floor. Have a firm chair that has side arms. You can use this for support while you get dressed. Do not have throw rugs and other things on the floor that can make you trip. What can I do in the kitchen? Clean up any spills right away. Avoid walking on wet floors. Keep items that you use a lot in easy-to-reach places. If you need to reach something above you, use a strong step stool that has a grab bar. Keep electrical cords out of the way. Do not use floor polish or wax that makes floors slippery. If you must use wax, use non-skid floor wax. Do not have throw rugs and other things on the floor that can make you trip. What can I do with my stairs? Do not leave any items on the stairs. Make sure that there are handrails on both sides of the stairs and use them. Fix handrails that are broken or loose. Make sure that handrails are as long as the stairways. Check any carpeting to make sure that it is firmly attached to the stairs. Fix any carpet that is loose or worn. Avoid having throw rugs at the top or bottom of the stairs. If you do have throw rugs, attach them to the floor  with carpet tape. Make sure that you have a light switch at the top of the stairs and the bottom of the stairs. If you do not have them, ask someone to add them for you. What else can I do to help prevent falls? Wear shoes that: Do not have high heels. Have rubber bottoms. Are comfortable and fit you well. Are closed at the toe. Do not wear sandals. If you use a stepladder: Make sure that it is fully opened. Do not climb a closed stepladder. Make sure that both sides of the stepladder are locked into place. Ask someone to hold it for you, if possible. Clearly mark and make sure that you can see: Any grab bars or handrails. First and last steps. Where the edge of each step is. Use tools that help you move around (mobility aids)  if they are needed. These include: Canes. Walkers. Scooters. Crutches. Turn on the lights when you go into a dark area. Replace any light bulbs as soon as they burn out. Set up your furniture so you have a clear path. Avoid moving your furniture around. If any of your floors are uneven, fix them. If there are any pets around you, be aware of where they are. Review your medicines with your doctor. Some medicines can make you feel dizzy. This can increase your chance of falling. Ask your doctor what other things that you can do to help prevent falls. This information is not intended to replace advice given to you by your health care provider. Make sure you discuss any questions you have with your health care provider. Document Released: 07/24/2009 Document Revised: 03/04/2016 Document Reviewed: 11/01/2014 Elsevier Interactive Patient Education  2017 [employer].

## 2022-07-19 NOTE — Progress Notes (Signed)
Virtual Visit via Telephone Note  I connected with  Dustin [name] on 07/19/22 at  8:15 AM EDT by telephone and verified that I am speaking with the correct person using two identifiers.  Location: Patient: home Provider: [facility] Persons participating in the virtual visit: [place]   I discussed the limitations, risks, security and privacy concerns of performing an evaluation and management service by telephone and the availability of in person appointments. The patient expressed understanding and agreed to proceed.  Interactive audio and video telecommunications were attempted between this nurse and patient, however failed, due to patient having technical difficulties OR patient did not have access to video capability.  We continued and completed visit with audio only.  Some vital signs may be absent or patient reported.   Dionisio David, LPN  Subjective:   Dustin [name] is a 66 y.o. male who presents for Medicare Annual/Subsequent preventive examination.  Review of Systems     Cardiac Risk Factors include: advanced age (>81mn, >>3women);dyslipidemia;hypertension;male gender     Objective:    There were no vitals filed for this visit. There is no height or weight on file to calculate BMI.     07/19/2022    8:23 AM 03/27/2022    2:04 PM 12/23/2021    7:22 AM 07/06/2021    6:06 PM 09/09/2019    5:26 PM 08/02/2018    1:34 PM 11/19/2016   10:58 AM  Advanced Directives  Does Patient Have a Medical Advance Directive? No Yes Yes Yes Yes;No Yes Yes  Type of ASocial research officer, governmentLiving will [place]fieldLiving will Living will;Healthcare Power of [place] Does patient want to make changes to medical advance directive?   No - Patient declined      Copy of [place] Chart?   No - copy requested   No - copy requested   Would  patient like information on creating a medical advance directive? No - Patient declined          Current Medications (verified) Outpatient Encounter Medications as of 07/19/2022  Medication Sig   ALPRAZolam (XANAX) 0.5 MG tablet Take 1 tablet (0.5 mg total) by mouth at bedtime as needed for anxiety.   Brimonidine Tartrate (LUMIFY) 0.025 % SOLN Place 1 drop into both eyes daily.   buPROPion (WELLBUTRIN XL) 300 MG 24 hr tablet Take 1 tablet (300 mg total) by mouth daily.   metoprolol succinate (TOPROL-XL) 50 MG 24 hr tablet Take 50 mg by mouth daily.   mirabegron ER (MYRBETRIQ) 50 MG TB24 tablet Take 1 tablet (50 mg total) by mouth daily.   omeprazole (PRILOSEC) 40 MG capsule TAKE ONE CAPSULE BY MOUTH TWICE A DAY   rosuvastatin (CRESTOR) 5 MG tablet Take 5 mg by mouth every other day.   sildenafil (REVATIO) 20 MG tablet Take 20 mg by mouth daily.   silodosin (RAPAFLO) 8 MG CAPS capsule TAKE ONE CAPSULE BY MOUTH DAILY WITH BREAKFAST   doxycycline (VIBRA-TABS) 100 MG tablet Take 1 tablet (100 mg total) by mouth 2 (two) times daily. (Patient not taking: Reported on 07/19/2022)   Multiple Vitamin (MULTIVITAMIN) tablet Take 1 tablet by mouth daily. (Patient not taking: Reported on 07/19/2022)   valsartan (DIOVAN) 320 MG tablet Hold until followup with PCP due to acute kidney injury and low blood pressure. (Patient not taking: Reported on 07/19/2022)  No facility-administered encounter medications on file as of 07/19/2022.    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   Past Surgical History:  Procedure Laterality Date   ABDOMINAL EXPLORATION SURGERY  1978    APPENDECTOMY     at same time as lymph node dissection   CARPAL TUNNEL RELEASE Right    COLONOSCOPY WITH PROPOFOL N/A 05/13/2022   Procedure: COLONOSCOPY WITH PROPOFOL;  Surgeon: Lucilla Lame, MD;  Location: [facility];  Service: Endoscopy;  Laterality: N/A;   ESOPHAGOGASTRODUODENOSCOPY  12/2006   and colon   ESOPHAGOGASTRODUODENOSCOPY (EGD) WITH PROPOFOL Bilateral 05/13/2022   Procedure: ESOPHAGOGASTRODUODENOSCOPY (EGD) WITH PROPOFOL with balloon dilation;  Surgeon: Lucilla Lame, MD;  Location: [facility];  Service: Endoscopy;  Laterality: Bilateral;   INGUINAL HERNIA REPAIR  child   bilateral   JOINT REPLACEMENT     LEFT HEART CATH AND CORONARY ANGIOGRAPHY Left 12/23/2021   Procedure: LEFT HEART CATH AND CORONARY ANGIOGRAPHY;  Surgeon: Isaias Cowman, MD;  Location: [facility];  Service: Cardiovascular;  Laterality: Left;   MOHS SURGERY Left nose   at [facility]--- 2013   prolactinoma excision  06/2003   [facility]   right testicle removed  [place] Left 03/2016   and biceps repair--[facility]   stress nuclear  negative   10/09   TONSILLECTOMY     Family History  Problem Relation Age of Onset   AAA (abdominal aortic aneurysm) Father    Brain cancer Father    Cancer Paternal Aunt        lung   Cancer Other        strong in family   Cancer Brother        melanoma   Cerebral aneurysm Paternal Grandfather    Osteoporosis Mother    Social History   Socioeconomic History   Marital status: Significant Other    Spouse name: Not on file   Number of children: 0   Years of education: Not on file   Highest education level: Not on file  Occupational History   Occupation: Sales/Marketing boats  Tobacco Use   Smoking status: Former    Packs/day: 1.50    Years: 40.00    Total pack years: 60.00    Types: Cigarettes    Quit date: 04/25/2012    Years since quitting: 10.2   Smokeless tobacco: Never  Vaping Use   Vaping Use:  Never used  Substance and Sexual Activity   Alcohol use: Yes    Comment: occasional   Drug use: Yes    Types: Marijuana    Comment: When BP is elevated. maybe twice a month   Sexual activity: Yes    Partners: Female  Other Topics Concern   Not on file  Social History Narrative             Social Determinants of Health   Financial Resource Strain: Low Risk  (07/19/2022)   Overall Financial Resource Strain (CARDIA)    Difficulty of Paying Living Expenses: Not hard at all  Food Insecurity: No Food Insecurity (07/19/2022)   Hunger Vital Sign  Worried About Charity fundraiser in the Last Year: Never true    [place] in the Last Year: Never true  Transportation Needs: No Transportation Needs (07/19/2022)   PRAPARE - Hydrologist (Medical): No    Lack of Transportation (Non-Medical): No  Physical Activity: Insufficiently Active (07/19/2022)   Exercise Vital Sign    Days of Exercise per Week: 2 days    Minutes of Exercise per Session: 20 min  Stress: No Stress Concern Present (07/19/2022)   [place]    Feeling of Stress : Not at all  Social Connections: Moderately Isolated (07/19/2022)   Social Connection and Isolation Panel [NHANES]    Frequency of Communication with Friends and Family: More than three times a week    Frequency of Social Gatherings with Friends and Family: Once a week    Attends Religious Services: Never    Printmaker: Not on file    Attends Archivist Meetings: Never    Marital Status: Living with partner    Tobacco Counseling Counseling given: Not Answered   Clinical Intake:  Pre-visit preparation completed: Yes  Pain : No/denies pain     Nutritional Risks: None Diabetes: No  How often do you need to have someone help you when you read instructions, pamphlets, or other written materials from your doctor or  pharmacy?: 1 - Never  Diabetic?no  Interpreter Needed?: No  Information entered by :: Kirke Shaggy, LPN   Activities of Daily Living    07/19/2022    8:24 AM 07/19/2022    8:12 AM  In your present state of health, do you have any difficulty performing the following activities:  Hearing? 0 0  Vision? 0 0  Difficulty concentrating or making decisions? 0 0  Walking or climbing stairs? 0 0  Dressing or bathing? 0 0  Doing errands, shopping? 0 0  Preparing Food and eating ? N N  Using the Toilet? N N  In the past six months, have you accidently leaked urine? Y Y  Do you have problems with loss of bowel control? N N  Managing your Medications? N N  Managing your Finances? N N  Housekeeping or managing your Housekeeping? N N    Patient Care Team: Jerrol Banana., MD as PCP - General (Family Medicine) Arvil Chaco, PA-C (Inactive) as Physician Assistant (Cardiology)  Indicate any recent Medical Services you may have received from other than [facility] providers in the past year (date may be approximate).     Assessment:   This is a routine wellness examination for Dustin [name].  Hearing/Vision screen Hearing Screening - Comments:: No aids Vision Screening - Comments:: Readers- Dr.Lafavor  Dietary issues and exercise activities discussed: Current Exercise Habits: Home exercise routine, Type of exercise: walking, Time (Minutes): 20, Frequency (Times/Week): 2, Weekly Exercise (Minutes/Week): 40, Intensity: Mild   Goals Addressed             This Visit's Progress    DIET - EAT MORE FRUITS AND VEGETABLES         Depression Screen    07/19/2022    8:21 AM 03/19/2022    1:15 PM 07/06/2021    6:02 PM 10/15/2020    8:46 AM 03/04/2020    3:10 PM 12/04/2019    3:33 PM 08/02/2018    1:34 PM  PHQ 2/9 Scores  PHQ - 2 Score 1  0 0 0 0 0 0  PHQ- 9 Score 1 4  0 5 6     Fall Risk    07/19/2022    8:23 AM 07/19/2022    8:12 AM 03/19/2022    1:15 PM 07/06/2021    6:07 PM 10/15/2020     8:40 AM  Fall Risk   Falls in the past year? 0 0 0 0 0  Number falls in past yr: 0 0 0 0 0  Injury with Fall? 0 0 0 0 0  Risk for fall due to : No Fall Risks   No Fall Risks No Fall Risks  Follow up Falls prevention discussed;Falls evaluation completed  Falls evaluation completed Falls evaluation completed Falls evaluation completed    FALL RISK PREVENTION PERTAINING TO THE HOME:  Any stairs in or around the home? No  If so, are there any without handrails? No  Home free of loose throw rugs in walkways, pet beds, electrical cords, etc? Yes  Adequate lighting in your home to reduce risk of falls? Yes   ASSISTIVE DEVICES UTILIZED TO PREVENT FALLS:  Life alert? No  Use of a cane, walker or w/c? No  Grab bars in the bathroom? No  Shower chair or bench in shower? No  Elevated toilet seat or a handicapped toilet? No    Cognitive Function:        07/19/2022    8:25 AM 07/06/2021    6:08 PM  6CIT Screen  What Year? 0 points 0 points  What month? 0 points 0 points  What time? 0 points 0 points  Count back from 20 0 points 0 points  Months in reverse 0 points 0 points  Repeat phrase 0 points 0 points  Total Score 0 points 0 points    Immunizations Immunization History  Administered Date(s) Administered   Influenza Split 10/12/2011   Moderna Sars-Covid-2 Vaccination 01/10/2020, 02/07/2020   Td 01/17/2003, 07/09/2009   Tdap 06/12/2014    TDAP status: Up to date  Flu Vaccine status: Declined, Education has been provided regarding the importance of this vaccine but patient still declined. Advised may receive this vaccine at local pharmacy or Health Dept. Aware to provide a copy of the vaccination record if obtained from local pharmacy or Health Dept. Verbalized acceptance and understanding.  Pneumococcal vaccine status: Declined,  Education has been provided regarding the importance of this vaccine but patient still declined. Advised may receive this vaccine at local pharmacy  or Health Dept. Aware to provide a copy of the vaccination record if obtained from local pharmacy or Health Dept. Verbalized acceptance and understanding.   Covid-19 vaccine status: Completed vaccines  Qualifies for Shingles Vaccine? Yes   Zostavax completed No   Shingrix Completed?: No.    Education has been provided regarding the importance of this vaccine. Patient has been advised to call insurance company to determine out of pocket expense if they have not yet received this vaccine. Advised may also receive vaccine at local pharmacy or Health Dept. Verbalized acceptance and understanding.  Screening Tests Health Maintenance  Topic Date Due   Zoster Vaccines- Shingrix (1 of 2) Never done   Pneumonia Vaccine 83+ Years old (1 - PCV) Never done   COVID-19 Vaccine (3 - Moderna risk series) 03/06/2020   INFLUENZA VACCINE  05/11/2022   TETANUS/TDAP  06/12/2024   COLONOSCOPY (Pts 45-61yr Insurance coverage will need to be confirmed)  05/13/2029   Hepatitis C Screening  Completed   HPV VACCINES  Aged Out    Health Maintenance  Health Maintenance Due  Topic Date Due   Zoster Vaccines- Shingrix (1 of 2) Never done   Pneumonia Vaccine 34+ Years old (1 - PCV) Never done   COVID-19 Vaccine (3 - Moderna risk series) 03/06/2020   INFLUENZA VACCINE  05/11/2022    Colorectal cancer screening: Type of screening: Colonoscopy. Completed 05/13/22. Repeat every 7 years  Lung Cancer Screening: (Low Dose CT Chest recommended if Age 55-80 years, 30 pack-year currently smoking OR have quit w/in 15years.) does not qualify.   Additional Screening:  Hepatitis C Screening: does qualify; Completed 04/22/20  Vision Screening: Recommended annual ophthalmology exams for early detection of glaucoma and other disorders of the eye. Is the patient up to date with their annual eye exam?  Yes  Who is the provider or what is the name of the office in which the patient attends annual eye exams? Dr.Ostrand If pt is not  established with a provider, would they like to be referred to a provider to establish care? No .   Dental Screening: Recommended annual dental exams for proper oral hygiene  Community Resource Referral / Chronic Care Management: CRR required this visit?  No   CCM required this visit?  No      Plan:     I have personally reviewed and noted the following in the patient's chart:   Medical and social history Use of alcohol, tobacco or illicit drugs  Current medications and supplements including opioid prescriptions. Patient is not currently taking opioid prescriptions. Functional ability and status Nutritional status Physical activity Advanced directives List of other physicians Hospitalizations, surgeries, and ER visits in previous 12 months Vitals Screenings to include cognitive, depression, and falls Referrals and appointments  In addition, I have reviewed and discussed with patient certain preventive protocols, quality metrics, and best practice recommendations. A written personalized care plan for preventive services as well as general preventive health recommendations were provided to patient.     Dionisio David, LPN   [id number]   Nurse Notes: none

## 2022-07-22 ENCOUNTER — Ambulatory Visit (INDEPENDENT_AMBULATORY_CARE_PROVIDER_SITE_OTHER): Payer: Medicare Other | Admitting: Urology

## 2022-07-22 ENCOUNTER — Encounter: Payer: Self-pay | Admitting: Urology

## 2022-07-22 VITALS — BP 155/78 | HR 67 | Ht 70.0 in | Wt 233.0 lb

## 2022-07-22 DIAGNOSIS — N401 Enlarged prostate with lower urinary tract symptoms: Secondary | ICD-10-CM | POA: Diagnosis not present

## 2022-07-22 LAB — URINALYSIS, COMPLETE
Bilirubin, UA: NEGATIVE
Glucose, UA: NEGATIVE
Ketones, UA: NEGATIVE
Leukocytes,UA: NEGATIVE
Nitrite, UA: NEGATIVE
Protein,UA: NEGATIVE
RBC, UA: NEGATIVE
Specific Gravity, UA: 1.015 (ref 1.005–1.030)
Urobilinogen, Ur: 0.2 mg/dL (ref 0.2–1.0)
pH, UA: 5.5 (ref 5.0–7.5)

## 2022-07-22 LAB — MICROSCOPIC EXAMINATION: Bacteria, UA: NONE SEEN

## 2022-07-22 LAB — BLADDER SCAN AMB NON-IMAGING

## 2022-07-22 MED ORDER — GEMTESA 75 MG PO TABS: 75.0000 mg | ORAL_TABLET | Freq: Every day | ORAL | 0 refills | Status: DC

## 2022-07-22 NOTE — Progress Notes (Signed)
07/22/2022 12:42 PM   Dustin [name] 10-29-1955 [id number]  Referring provider: Jerrol Banana., MD No address on file  Chief Complaint  Patient presents with   Benign Prostatic Hypertrophy    HPI: 66 y.o. male presents for follow-up visit.  Initially seen 04/08/2022 with lower urinary tract symptoms including frequency, urgency, weak stream and nocturia Had previously been seen by Dr. Lottie Rater at [facility] Had been on tamsulosin without significant improvement in his symptoms Tamsulosin was discontinued and started on silodosin with some improvement in his urinary stream Episode gross hematuria which he felt occurred secondary to trauma after squeezing distal shaft during a bout of extreme urgency Cystoscopy with moderate lateral lobe enlargement and mild bladder neck elevation.  No bladder mucosal abnormalities Given Myrbetriq samples at that visit 06/16/2022 Urine had shown mild pyuria and culture grew a low level of corynebacterium.  Was treated empirically with doxycycline as he was getting ready to travel out of state.  He had bouts while driving out of state of having to pull over every 30 minutes to void.  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    Surgical History: Past Surgical History:  Procedure Laterality Date   ABDOMINAL EXPLORATION SURGERY  1978   APPENDECTOMY     at same time as lymph node dissection   CARPAL TUNNEL RELEASE Right    COLONOSCOPY WITH PROPOFOL N/A 05/13/2022   Procedure: COLONOSCOPY WITH PROPOFOL;  Surgeon: Lucilla Lame, MD;  Location: [place];  Service: Endoscopy;  Laterality: N/A;   ESOPHAGOGASTRODUODENOSCOPY   12/2006   and colon   ESOPHAGOGASTRODUODENOSCOPY (EGD) WITH PROPOFOL Bilateral 05/13/2022   Procedure: ESOPHAGOGASTRODUODENOSCOPY (EGD) WITH PROPOFOL with balloon dilation;  Surgeon: Lucilla Lame, MD;  [facility];  Service: Endoscopy;  Laterality: Bilateral;   INGUINAL HERNIA REPAIR  child   bilateral   JOINT REPLACEMENT     LEFT HEART CATH AND CORONARY ANGIOGRAPHY Left 12/23/2021   Procedure: LEFT HEART CATH AND CORONARY ANGIOGRAPHY;  Surgeon: Isaias Cowman, MD;  Location: [place] [facility];  Service: Cardiovascular;  Laterality: Left;   MOHS SURGERY Left nose   at [facility]--- 2013   prolactinoma excision  06/2003   [facility]   right testicle removed  [place] Left 03/2016   and biceps repair--[facility]   stress nuclear  negative   10/09   TONSILLECTOMY      Home Medications:  Allergies as of 07/22/2022       Reactions   Amlodipine Swelling   LE EDEMA   Sulfa Antibiotics    Low BP   Varenicline Tartrate    Irritability        Medication List        Accurate as of July 22, 2022 12:42 PM. If you have any questions, ask your nurse or doctor.          STOP taking these medications    doxycycline 100 MG tablet Commonly known as: VIBRA-TABS Stopped by: Abbie Sons, MD   mirabegron ER 50 MG Tb24 tablet Commonly known as: MYRBETRIQ Stopped by: Nicki Reaper  Dustin Arn, MD   multivitamin tablet Stopped by: Abbie Sons, MD   valsartan 320 MG tablet Commonly known as: Diovan Stopped by: Abbie Sons, MD       TAKE these medications    ALPRAZolam 0.5 MG tablet Commonly known as: XANAX Take 1 tablet (0.5 mg total) by mouth at bedtime as needed for anxiety.   buPROPion 300 MG 24 hr tablet Commonly known as: WELLBUTRIN XL Take 1 tablet (300 mg total) by mouth daily.   Gemtesa 75 MG Tabs Generic drug: Vibegron Take 75 mg by mouth daily. Started by: Abbie Sons, MD   Lumify 0.025 %  Soln Generic drug: Brimonidine Tartrate Place 1 drop into both eyes daily.   metoprolol succinate 50 MG 24 hr tablet Commonly known as: TOPROL-XL Take 50 mg by mouth daily.   omeprazole 40 MG capsule Commonly known as: PRILOSEC TAKE ONE CAPSULE BY MOUTH TWICE A DAY   rosuvastatin 5 MG tablet Commonly known as: CRESTOR Take 5 mg by mouth every other day.   sildenafil 20 MG tablet Commonly known as: REVATIO Take 20 mg by mouth daily.   silodosin 8 MG Caps capsule Commonly known as: RAPAFLO TAKE ONE CAPSULE BY MOUTH DAILY WITH BREAKFAST        Allergies:  Allergies  Allergen Reactions   Amlodipine Swelling    LE EDEMA   Sulfa Antibiotics     Low BP   Varenicline Tartrate     Irritability    Family History: Family History  Problem Relation Age of Onset   AAA (abdominal aortic aneurysm) Father    Brain cancer Father    Cancer Paternal Aunt        lung   Cancer Other        strong in family   Cancer Brother        melanoma   Cerebral aneurysm Paternal Grandfather    Osteoporosis Mother     Social History:  reports that he quit smoking about 10 years ago. His smoking use included cigarettes. He has a 60.00 pack-year smoking history. He has never used smokeless tobacco. He reports current alcohol use. He reports current drug use. Drug: Marijuana.   Physical Exam: BP (!) 155/78   Pulse 67   Ht '5\' 10"'$  (1.778 m)   Wt 233 lb (105.7 kg)   BMI 33.43 kg/m   Constitutional:  Alert and oriented, No acute distress. HEENT: [place] AT Respiratory: Normal respiratory effort, no increased work of breathing. Psychiatric: Normal mood and affect.  Laboratory Data:  Urinalysis     Assessment & Plan:    1.  BPH with LUTS PVR 0 mL Some improvement in obstructive voiding symptoms with silodosin No significant improvement in storage related voiding symptoms He has completed Myrbetriq and will give a trial of Gemtesa x4 weeks Call back regarding efficacy We discussed  outlet procedures are not always beneficial for storage related voiding symptoms.  Would recommend urodynamic study prior to an outlet procedure   Abbie Sons, MD  [facility] [address], [place], [place] [address] [phone]

## 2022-08-06 NOTE — Telephone Encounter (Signed)
Completed.

## 2022-08-09 ENCOUNTER — Encounter (INDEPENDENT_AMBULATORY_CARE_PROVIDER_SITE_OTHER): Payer: Self-pay

## 2022-08-30 ENCOUNTER — Encounter: Payer: Self-pay | Admitting: Urology

## 2022-08-30 ENCOUNTER — Other Ambulatory Visit: Payer: Self-pay | Admitting: Family Medicine

## 2022-08-30 MED ORDER — GEMTESA 75 MG PO TABS: 75.0000 mg | ORAL_TABLET | Freq: Every day | ORAL | 2 refills | Status: DC

## 2022-09-09 ENCOUNTER — Ambulatory Visit: Payer: BLUE CROSS/BLUE SHIELD | Admitting: Family Medicine

## 2022-09-10 ENCOUNTER — Other Ambulatory Visit: Payer: Self-pay | Admitting: Urology

## 2022-09-16 ENCOUNTER — Other Ambulatory Visit: Payer: Self-pay | Admitting: Physician Assistant

## 2022-09-16 MED ORDER — ALPRAZOLAM 0.5 MG PO TABS
0.5000 mg | ORAL_TABLET | Freq: Every evening | ORAL | 0 refills | Status: DC | PRN
Start: 2022-09-16 — End: 2022-11-12

## 2022-09-16 NOTE — Addendum Note (Signed)
Addended by: Barnie Mort on: 09/16/2022 02:04 PM   Modules accepted: Orders

## 2022-09-16 NOTE — Telephone Encounter (Signed)
[employer]  is calling for refills of Alprazolam 0.5 mg.

## 2022-10-13 ENCOUNTER — Ambulatory Visit (INDEPENDENT_AMBULATORY_CARE_PROVIDER_SITE_OTHER): Payer: Medicare Other | Admitting: Urology

## 2022-10-13 VITALS — BP 130/78 | HR 74 | Ht 70.0 in | Wt 232.0 lb

## 2022-10-13 DIAGNOSIS — N401 Enlarged prostate with lower urinary tract symptoms: Secondary | ICD-10-CM

## 2022-10-13 DIAGNOSIS — R310 Gross hematuria: Secondary | ICD-10-CM

## 2022-10-13 LAB — URINALYSIS, COMPLETE
Bilirubin, UA: NEGATIVE
Glucose, UA: NEGATIVE
Ketones, UA: NEGATIVE
Nitrite, UA: NEGATIVE
Protein,UA: NEGATIVE
RBC, UA: NEGATIVE
Specific Gravity, UA: 1.02 (ref 1.005–1.030)
Urobilinogen, Ur: 0.2 mg/dL (ref 0.2–1.0)
pH, UA: 5.5 (ref 5.0–7.5)

## 2022-10-13 LAB — MICROSCOPIC EXAMINATION

## 2022-10-13 NOTE — Progress Notes (Signed)
10/13/2022 1:59 PM   [name] November 05, 1955 [id number]  Referring provider: Jerrol Banana., MD No address on file  Chief Complaint  Patient presents with   Benign Prostatic Hypertrophy    HPI: MEKO MASTERSON III is a 67 y.o. male who presents for evaluation of BPH and prostatitis.  Saw Dr. Lottie Rater [facility] 11/2021 for lower urinary tract symptoms including frequency, urgency, weak/intermittent stream and nocturia IPSS was 24/35 Started on tamsulosin with improvement in his lower urinary tract symptoms Also started on sildenafil for ED History of testis cancer with prior right orchiectomy and RPLND.  Loss of seminal emission postop Seen [facility] 02/25/2022 with a 76-monthhistory of intermittent dysuria and frequency.  Urinalysis showed trace leukocytes with negative microscopy PCP visit 03/19/2022 with complaints of penile pain, frequency and dysuria.  UA with 11-30 WBC and he was started on Septra DS.  Urine culture subsequently grew mixed flora Hospitalized 03/27/2022 for a near syncopal episode and dehydration.  Creatinine was 2.62 which returned to near baseline at 1.5 after hydration Remains on tamsulosin.  IPSS today 23/35  PMH: Past Medical History:  Diagnosis Date   Arthritis    Carpal tunnel syndrome of left wrist    Family history of cerebral aneurysm    History of colonic polyps    History of COVID-19 07/2020   Hypertension    [place]   [place] 1978   teratocarcinoma    Surgical History: Past Surgical History:  Procedure Laterality Date   ABDOMINAL EXPLORATION SURGERY  1978   APPENDECTOMY     at same time as lymph node dissection   CARPAL TUNNEL RELEASE Right    COLONOSCOPY WITH PROPOFOL N/A 05/13/2022   Procedure: COLONOSCOPY WITH PROPOFOL;  Surgeon: WLucilla Lame MD;  [place]  Service: Endoscopy;  Laterality: N/A;   ESOPHAGOGASTRODUODENOSCOPY  12/2006   and colon    ESOPHAGOGASTRODUODENOSCOPY (EGD) WITH PROPOFOL Bilateral 05/13/2022   Procedure: ESOPHAGOGASTRODUODENOSCOPY (EGD) WITH PROPOFOL with balloon dilation;  Surgeon: WLucilla Lame MD;  [place]  Service: Endoscopy;  Laterality: Bilateral;   INGUINAL HERNIA REPAIR  child   bilateral   JOINT REPLACEMENT     LEFT HEART CATH AND CORONARY ANGIOGRAPHY Left 12/23/2021   Procedure: LEFT HEART CATH AND CORONARY ANGIOGRAPHY;  Surgeon: PIsaias Cowman MD;  [facility];  Service: Cardiovascular;  Laterality: Left;   [facility]   prolactinoma excision  06/2003   [facility]   [place]   and [facility]--[facility]   stress nuclear  negative   10/09   TONSILLECTOMY      Home Medications:  Allergies as of 10/13/2022       Reactions   Amlodipine Swelling   LE EDEMA   Sulfa Antibiotics    Low BP   Varenicline Tartrate    Irritability        Medication List        Accurate as of October 13, 2022  1:59 PM. If you have any questions, ask your nurse or doctor.          ALPRAZolam 0.5 MG tablet Commonly known as: XANAX Take 1 tablet (0.5 mg total) by mouth at bedtime as needed for anxiety.   buPROPion 300 MG 24 hr tablet Commonly known as: WELLBUTRIN XL Take 1 tablet (300 mg total)  by mouth daily.   Gemtesa 75 MG Tabs Generic drug: Vibegron Take 75 mg by mouth daily.   Lumify 0.025 % Soln Generic drug: Brimonidine Tartrate Place 1 drop into both eyes daily.   metoprolol succinate 50 MG 24 hr tablet Commonly known as: TOPROL-XL Take 50 mg by mouth daily.   omeprazole 40 MG capsule Commonly known as: PRILOSEC TAKE ONE CAPSULE BY MOUTH TWICE A DAY   rosuvastatin 5 MG tablet Commonly known as: CRESTOR Take 5 mg by mouth every other day.   sildenafil 20 MG tablet Commonly known as: REVATIO Take 20 mg by mouth daily.   silodosin 8 MG  Caps capsule Commonly known as: RAPAFLO TAKE 1 CAPSULE BY MOUTH DAILY WITH BREAKFAST        Allergies:  Allergies  Allergen Reactions   Amlodipine Swelling    LE EDEMA   Sulfa Antibiotics     Low BP   Varenicline Tartrate     Irritability    Family History: Family History  Problem Relation Age of Onset   AAA (abdominal aortic aneurysm) Father    Brain cancer Father    Cancer Paternal Aunt        lung   Cancer Other        strong in family   Cancer Brother        melanoma   Cerebral aneurysm Paternal Grandfather    Osteoporosis Mother     Social History:  reports that he quit smoking about 10 years ago. His smoking use included cigarettes. He has a 60.00 pack-year smoking history. He has never used smokeless tobacco. He reports current alcohol use. He reports current drug use. Drug: Marijuana.   Physical Exam: BP 130/78   Pulse 74   Ht '5\' 10"'$  (1.778 m)   Wt 232 lb (105.2 kg)   BMI 33.29 kg/m   Constitutional:  Alert and oriented, No acute distress. HEENT: [place] AT Respiratory: Normal respiratory effort, no increased work of breathing. GU: Prostate 40 g, smooth without nodules Skin: No rashes, bruises or suspicious lesions. Neurologic: Grossly intact, no focal deficits, moving all 4 extremities. Psychiatric: Normal mood and affect.  Laboratory Data:  Urinalysis Dipstick/microscopy negative  Pertinent Imaging: CT angio chest/abdomen/pelvis 03/28/2022 showed no significant GU abnormalities   Assessment & Plan:    1. Benign prostatic hyperplasia with lower urinary tract symptoms, symptom details unspecified PVR 46 mL Still with a bothersome LUTS on tamsulosin though has seen some improvement.  Has a history of near syncope and we discussed titrating the tamsulosin to 0.8 mg though this could worsen syncope and orthostatic changes Trial of a prostate-specific alpha-blocker was discussed which he was interested in trying.  Rx silodosin 8 mg sent to  pharmacy Adding a 5-ARI was also discussed however he was informed it would take 4-6 months to see efficacy He also had questions regarding minimally invasive outlet procedures.  UroLift was discussed and if he was interested in pursuing that would need cystoscopy/TRUS He will call back regarding efficacy of silodosin A tentative 10-monthfollow-up was scheduled though if having worsening symptoms he was instructed to call back earlier   SAbbie Sons MD  [place] [place] [phone]

## 2022-10-14 ENCOUNTER — Encounter: Payer: Self-pay | Admitting: Urology

## 2022-10-17 LAB — CULTURE, URINE COMPREHENSIVE

## 2022-10-18 ENCOUNTER — Encounter: Payer: Self-pay | Admitting: *Deleted

## 2022-11-05 ENCOUNTER — Encounter: Payer: Self-pay | Admitting: Urology

## 2022-11-08 ENCOUNTER — Telehealth: Payer: Self-pay | Admitting: *Deleted

## 2022-11-08 NOTE — Telephone Encounter (Signed)
-----  Message from Abbie Sons, MD sent at 11/05/2022  2:51 PM EST ----- Regarding: Appointment Please schedule appointment with Dr. Nancee Liter opinion storage related voiding symptoms.  Urodynamic study done 10/25/2022

## 2022-11-08 NOTE — Telephone Encounter (Signed)
Left message

## 2022-11-08 NOTE — Telephone Encounter (Signed)
Patient returned call and stated that he has already had an appointment for a second opinion. He went to [facility] the next week after he had the study done. Please call patient back to discuss.

## 2022-11-09 NOTE — Telephone Encounter (Signed)
Talked to patient and he is going to [facility] .

## 2022-11-12 ENCOUNTER — Other Ambulatory Visit: Payer: Self-pay | Admitting: Physician Assistant

## 2022-11-12 NOTE — Telephone Encounter (Signed)
MyChart letter sent advising follow-up appointment for further refills

## 2022-12-02 ENCOUNTER — Other Ambulatory Visit: Payer: Self-pay | Admitting: Urology

## 2022-12-07 ENCOUNTER — Ambulatory Visit
Admission: RE | Admit: 2022-12-07 | Discharge: 2022-12-07 | Disposition: A | Discharge status: Home / Self Care | Payer: Medicare Other | Source: Ambulatory Visit | Attending: Vascular Surgery | Admitting: Vascular Surgery

## 2022-12-07 DIAGNOSIS — I7121 Aneurysm of the ascending aorta, without rupture: Secondary | ICD-10-CM

## 2022-12-07 LAB — POCT I-STAT CREATININE: Creatinine, Ser: 1.4 mg/dL — ABNORMAL HIGH (ref 0.61–1.24)

## 2022-12-07 MED ORDER — IOHEXOL 350 MG/ML SOLN
75.0000 mL | Freq: Once | INTRAVENOUS | Status: AC | PRN
  Administered 2022-12-07: 75 mL via INTRAVENOUS

## 2022-12-13 ENCOUNTER — Ambulatory Visit (INDEPENDENT_AMBULATORY_CARE_PROVIDER_SITE_OTHER): Payer: Medicare Other | Admitting: Vascular Surgery

## 2022-12-14 ENCOUNTER — Other Ambulatory Visit: Payer: Self-pay | Admitting: Urology

## 2022-12-18 NOTE — Progress Notes (Signed)
MRN : [id number]  Dustin [name] is a 67 y.o. ([birth year]) male who presents with chief complaint of check circulation.  History of Present Illness:   The patient presents to the office for evaluation of an ascending thoracic aortic aneurysm. The aneurysm was found incidentally by CT scan. Patient denies chest pain or unusual back pain, no other chest or abdominal complaints.  No history of an abrupt onset of a painful toe associated with blue discoloration.     No family history of TAA/AAA.   Patient denies amaurosis fugax or TIA symptoms.  There is no history of claudication or rest pain symptoms of the lower extremities.   The patient denies angina or shortness of breath.  CT scan dated 12/07/2022, shows an ascending TAA that measures 4.5 cm   No outpatient medications have been marked as taking for the 12/20/22 encounter (Appointment) with Gilda Crease, Latina Craver, MD.    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    Past Surgical History:  Procedure Laterality Date   ABDOMINAL EXPLORATION SURGERY  1978   APPENDECTOMY     at same time as lymph node dissection   CARPAL TUNNEL RELEASE Right    COLONOSCOPY WITH PROPOFOL N/A 05/13/2022   Procedure: COLONOSCOPY WITH PROPOFOL;  Surgeon: Midge Minium, MD;  Location: [facility];  Service: Endoscopy;  Laterality: N/A;   ESOPHAGOGASTRODUODENOSCOPY  12/2006   and colon   ESOPHAGOGASTRODUODENOSCOPY (EGD) WITH PROPOFOL Bilateral 05/13/2022   Procedure: ESOPHAGOGASTRODUODENOSCOPY (EGD) WITH PROPOFOL with balloon dilation;  Surgeon: Midge Minium, MD;  Location: [facility];  Service: Endoscopy;  Laterality: Bilateral;   INGUINAL HERNIA REPAIR  child   bilateral    JOINT REPLACEMENT     LEFT HEART CATH AND CORONARY ANGIOGRAPHY Left 12/23/2021   Procedure: LEFT HEART CATH AND CORONARY ANGIOGRAPHY;  Surgeon: Marcina Millard, MD;  Location: [facility] INVASIVE CV [facility];  Service: Cardiovascular;  Laterality: Left;   MOHS SURGERY Left nose   at [facility]--- 2013   prolactinoma excision  06/2003   [facility]   right testicle removed  1978   SHOULDER OPEN ROTATOR CUFF REPAIR Left 03/2016   and biceps repair--[facility]   stress nuclear  negative   10/09   TONSILLECTOMY      Social History Social History   Tobacco Use   Smoking status: Former    Packs/day: 1.50    Years: 40.00    Total pack years: 60.00    Types: Cigarettes    Quit date: 04/25/2012    Years since quitting: 10.6   Smokeless tobacco: Never  Vaping Use   Vaping Use: Never used  Substance Use Topics   Alcohol use: Yes    Comment: occasional   Drug use: Yes    Types: Marijuana    Comment: When BP is elevated. maybe twice a month    Family History Family History  Problem Relation Age of Onset   AAA (abdominal aortic aneurysm) Father    Brain cancer Father    Cancer Paternal Aunt        lung   Cancer Other  strong in family   Cancer Brother        melanoma   Cerebral aneurysm Paternal Grandfather    Osteoporosis Mother     Allergies  Allergen Reactions   Amlodipine Swelling    LE EDEMA   Sulfa Antibiotics     Low BP   Varenicline Tartrate     Irritability     REVIEW OF SYSTEMS (Negative unless checked)  Constitutional: [] Weight loss  [] Fever  [] Chills Cardiac: [] Chest pain   [] Chest pressure   [] Palpitations   [] Shortness of breath when laying flat   [] Shortness of breath with exertion. Vascular:  [x] Pain in legs with walking   [] Pain in legs at rest  [] History of DVT   [] Phlebitis   [] Swelling in legs   [] Varicose veins   [] Non-healing ulcers Pulmonary:   [] Uses home oxygen   [] Productive cough   [] Hemoptysis   [] Wheeze  [] COPD   [] Asthma Neurologic:   [] Dizziness   [] Seizures   [] History of stroke   [] History of TIA  [] Aphasia   [] Vissual changes   [] Weakness or numbness in arm   [] Weakness or numbness in leg Musculoskeletal:   [] Joint swelling   [] Joint pain   [] Low back pain Hematologic:  [] Easy bruising  [] Easy bleeding   [] Hypercoagulable state   [] Anemic Gastrointestinal:  [] Diarrhea   [] Vomiting  [] Gastroesophageal reflux/heartburn   [] Difficulty swallowing. Genitourinary:  [] Chronic kidney disease   [] Difficult urination  [] Frequent urination   [] Blood in urine Skin:  [] Rashes   [] Ulcers  Psychological:  [] History of anxiety   []  History of major depression.  Physical Examination  There were no vitals filed for this visit. There is no height or weight on file to calculate BMI. Gen: WD/WN, NAD Head: [place]/AT, No temporalis wasting.  Ear/Nose/Throat: Hearing grossly intact, nares w/o erythema or drainage Eyes: PER, EOMI, sclera nonicteric.  Neck: Supple, no masses.  No bruit or JVD.  Pulmonary:  Good air movement, no audible wheezing, no use of accessory muscles.  Cardiac: RRR, normal S1, S2, no Murmurs. Vascular:  mild trophic changes, no open wounds Vessel Right Left  Radial Palpable Palpable  PT Not Palpable Not Palpable  DP Not Palpable Not Palpable  Gastrointestinal: soft, non-distended. No guarding/no peritoneal signs.  Musculoskeletal: M/S 5/5 throughout.  No visible deformity.  Neurologic: CN 2-12 intact. Pain and light touch intact in extremities.  Symmetrical.  Speech is fluent. Motor exam as listed above. Psychiatric: Judgment intact, Mood & affect appropriate for pt's clinical situation. Dermatologic: No rashes or ulcers noted.  No changes consistent with cellulitis.   CBC Lab Results  Component Value Date   WBC 7.1 03/27/2022   HGB 15.6 03/27/2022   HCT 45.9 03/27/2022   MCV 91.8 03/27/2022   PLT 176 03/27/2022    BMET    Component Value Date/Time   NA 141 04/19/2022 1044   NA 138 07/18/2014 1018   K  4.6 04/19/2022 1044   K 4.4 07/18/2014 1018   CL 100 04/19/2022 1044   CL 107 07/18/2014 1018   CO2 26 04/19/2022 1044   CO2 24 07/18/2014 1018   GLUCOSE 103 (H) 04/19/2022 1044   GLUCOSE 101 (H) 03/28/2022 0613   GLUCOSE 93 07/18/2014 1018   BUN 17 04/19/2022 1044   BUN 14 07/18/2014 1018   CREATININE 1.40 (H) 12/07/2022 1302   CREATININE 1.05 07/18/2014 1018   CALCIUM 9.2 04/19/2022 1044   CALCIUM 8.5 07/18/2014 1018   GFRNONAA 50 (L) 03/28/2022 1610  GFRNONAA >60 07/18/2014 1018   GFRNONAA >60 08/25/2012 0836   GFRAA 61 04/22/2020 0921   GFRAA >60 07/18/2014 1018   GFRAA >60 08/25/2012 0836   CrCl cannot be calculated (Unknown ideal weight.).  COAG No results found for: "INR", "PROTIME"  Radiology CT ANGIO CHEST AORTA W/CM & OR WO/CM  Result Date: 12/07/2022 CLINICAL DATA:  Thoracic aortic aneurysm. EXAM: CT ANGIOGRAPHY CHEST WITH CONTRAST TECHNIQUE: Multidetector CT imaging of the chest was performed using the standard protocol during bolus administration of intravenous contrast. Multiplanar CT image reconstructions and MIPs were obtained to evaluate the vascular anatomy. RADIATION DOSE REDUCTION: This exam was performed according to the departmental dose-optimization program which includes automated exposure control, adjustment of the mA and/or kV according to patient size and/or use of iterative reconstruction technique. CONTRAST:  75mL OMNIPAQUE IOHEXOL 350 MG/ML SOLN COMPARISON:  March 28, 2022. FINDINGS: Cardiovascular: Atherosclerosis of thoracic aorta is again noted without dissection. Great vessels are widely patent without significant stenosis. There is again noted stable mural thrombus involving the ascending thoracic aorta with focal fusiform aneurysmal dilatation at this area measuring 4.5 cm which is unchanged compared to prior exam based on my own measurements. Normal cardiac size. No pericardial effusion. Mediastinum/Nodes: No enlarged mediastinal, hilar, or axillary  lymph nodes. Thyroid gland, trachea, and esophagus demonstrate no significant findings. Lungs/Pleura: Lungs are clear. No pleural effusion or pneumothorax. Upper Abdomen: No acute abnormality. Musculoskeletal: No chest wall abnormality. No acute or significant osseous findings. Review of the MIP images confirms the above findings. IMPRESSION: There does appear to be grossly stable focal fusiform aneurysmal dilatation of the ascending thoracic aorta with some degree of mural thrombus which measures 4.5 cm in diameter. This is not significantly changed compared to the prior exam based on my own measurements of prior exam. Ascending thoracic aortic aneurysm. Recommend semi-annual imaging followup by CTA or MRA and referral to cardiothoracic surgery if not already obtained. This recommendation follows 2010 ACCF/AHA/AATS/ACR/ASA/SCA/SCAI/SIR/STS/SVM Guidelines for the Diagnosis and Management of Patients With Thoracic Aortic Disease. Circulation. 2010; 121: Z610-R604. Aortic aneurysm NOS (ICD10-I71.9). Aortic Atherosclerosis (ICD10-I70.0). Electronically Signed   By: Lupita Raider M.D.   On: 12/07/2022 14:53     Assessment/Plan 1. Aneurysm of ascending aorta without rupture (HCC) Recommend:  No surgery or intervention is indicated at this time.  The patient has an asymptomatic thoracic aortic aneurysm that is less than 6.0 cm in maximal diameter.  I have discussed the natural history of thoracic aortic aneurysm and the small risk of rupture for aneurysm less than 6.5 cm in size.  However, as these small aneurysms tend to enlarge over time, continued surveillance with CT scan is mandatory.   I have also discussed optimizing medical management with hypertension and lipid control and the importance of abstinence from tobacco.  The patient is also encouraged to exercise a minimum of 30 minutes 4 times a week.   Should the patient develop new onset chest or back pain or signs of peripheral embolization they are  instructed to seek medical attention immediately and to alert the physician providing care that they have an aneurysm in the chest.   The patient voices their understanding.  The patient will return as ordered in two years with a CT scan of the chest (also abdomen and pelvis to follow the iliac artery aneurysm)  2. Iliac artery aneurysm (HCC) Recommend: No surgery or intervention is indicated at this time.  The patient has an asymptomatic iliac artery aneurysm that is less  than 4 cm in maximal diameter.    I have reviewed the natural history of abdominal aortic aneurysm and the small risk of rupture for aneurysm less than 3 cm in size.  However, as these small aneurysms tend to enlarge over time, continued surveillance with ultrasound or CT scan is mandatory.   I have also discussed optimizing medical management with hypertension and lipid control and the importance of abstinence from tobacco.  The patient is also encouraged to exercise a minimum of 30 minutes 4 times a week.   Should the patient develop new onset abdominal or back pain or signs of peripheral embolization they are instructed to seek medical attention immediately and to alert the physician providing care that they have an aneurysm.   The patient voices their understanding.  The patient will return in 24 months with a CT scan (given his TAA).  3. PAD (peripheral artery disease) (HCC) Recommend:  I do not find evidence of life style limiting vascular disease. The patient specifically denies life style limitation.  Previous noninvasive studies including ABI's of the legs do not identify critical vascular problems.  The patient should continue walking and begin a more formal exercise program. The patient should continue his antiplatelet therapy and aggressive treatment of the lipid abnormalities.  The patient is instructed to call the office if there is a significant change in the lower extremity symptoms, particularly  if a wound develops or there is an abrupt increase in leg pain.   4. Venous insufficiency of both lower extremities No surgery or intervention at this point in time.   The patient is CEAP [id number]   I have discussed with the patient venous insufficiency and why it  causes symptoms. I have discussed with the patient the chronic skin changes that accompany venous insufficiency and the long term sequela such as infection and ulceration.  Patient will begin wearing graduated compression stockings or compression wraps on a daily basis.  The patient will put the compression on first thing in the morning and removing them in the evening. The patient is instructed specifically not to sleep in the compression.    In addition, behavioral modification including several periods of elevation of the lower extremities during the day will be continued. I have demonstrated that proper elevation is a position with the ankles at heart level.  The patient is instructed to begin routine exercise, especially walking on a daily basis  The patient will be assessed for a Lymph Pump depending on the effectiveness of conservative therapy and the control of the associated lymphedema.  5. Essential hypertension, benign Continue antihypertensive medications as already ordered, these medications have been reviewed and there are no changes at this time.  6. Coronary artery disease involving native coronary artery of native heart without angina pectoris Continue cardiac and antihypertensive medications as already ordered and reviewed, no changes at this time.  Continue statin as ordered and reviewed, no changes at this time  Nitrates PRN for chest pain    Levora Dredge, MD  12/18/2022 1:55 PM

## 2022-12-19 ENCOUNTER — Other Ambulatory Visit: Payer: Self-pay | Admitting: Physician Assistant

## 2022-12-20 ENCOUNTER — Ambulatory Visit (INDEPENDENT_AMBULATORY_CARE_PROVIDER_SITE_OTHER): Payer: Medicare Other | Admitting: Vascular Surgery

## 2022-12-20 ENCOUNTER — Encounter (INDEPENDENT_AMBULATORY_CARE_PROVIDER_SITE_OTHER): Payer: Self-pay | Admitting: Vascular Surgery

## 2022-12-20 VITALS — BP 150/85 | HR 72 | Resp 16 | Wt 238.4 lb

## 2022-12-20 DIAGNOSIS — I872 Venous insufficiency (chronic) (peripheral): Secondary | ICD-10-CM

## 2022-12-20 DIAGNOSIS — I739 Peripheral vascular disease, unspecified: Secondary | ICD-10-CM

## 2022-12-20 DIAGNOSIS — I7121 Aneurysm of the ascending aorta, without rupture: Secondary | ICD-10-CM

## 2022-12-20 DIAGNOSIS — I2510 Atherosclerotic heart disease of native coronary artery without angina pectoris: Secondary | ICD-10-CM

## 2022-12-20 DIAGNOSIS — I723 Aneurysm of iliac artery: Secondary | ICD-10-CM

## 2022-12-20 DIAGNOSIS — I10 Essential (primary) hypertension: Secondary | ICD-10-CM

## 2022-12-20 NOTE — Telephone Encounter (Signed)
Requested medication (s) are due for refill today: yes  Requested medication (s) are on the active medication list: yes  Last refill:  11/12/22 #14  Future visit scheduled: no  Notes to clinic:  med not delegated to NT to RF Called pt and LM on VM to call back to schedule appt.   Requested Prescriptions  Pending Prescriptions Disp Refills   ALPRAZolam (XANAX) 0.5 MG tablet [Pharmacy Med Name: ALPRAZOLAM 0.5 MG TABLET] 14 tablet     Sig: TAKE 1 TABLET BY MOUTH EVERY NIGHT AT BEDTIME AS NEEDED FOR ANXIETY     Not Delegated - Psychiatry: Anxiolytics/Hypnotics 2 Failed - 12/19/2022 10:58 AM      Failed - This refill cannot be delegated      Failed - Urine Drug Screen completed in last 360 days      Passed - Patient is not pregnant      Passed - Valid encounter within last 6 months    Recent Outpatient Visits           8 months ago Essential hypertension   [place] Eulas Post, MD   8 months ago Essential hypertension   [place] Eulas Post, MD   9 months ago Chronic prostatitis   [place] Eulas Post, MD   9 months ago [place], [place], [place]   11 months ago [place] Post, MD

## 2022-12-22 ENCOUNTER — Encounter (INDEPENDENT_AMBULATORY_CARE_PROVIDER_SITE_OTHER): Payer: Self-pay | Admitting: Vascular Surgery

## 2022-12-31 ENCOUNTER — Other Ambulatory Visit: Payer: Self-pay | Admitting: Internal Medicine

## 2023-02-07 ENCOUNTER — Other Ambulatory Visit: Payer: Self-pay

## 2023-02-07 ENCOUNTER — Telehealth: Payer: Self-pay | Admitting: Gastroenterology

## 2023-02-07 DIAGNOSIS — G959 Disease of spinal cord, unspecified: Secondary | ICD-10-CM

## 2023-02-07 NOTE — Progress Notes (Signed)
Order has been placed for Repeat MRI that is due in June 2024.   The old order was from [facility] so a new one had to be placed.

## 2023-02-07 NOTE — Telephone Encounter (Signed)
Pt called left message still having intestinal issue and nausea please return call

## 2023-02-07 NOTE — Telephone Encounter (Signed)
Pt has never had an office visit with Dr Servando Snare, he will need to schedule a new patient appt and recommend to see primary care in the meantime

## 2023-02-28 ENCOUNTER — Encounter: Payer: Self-pay | Admitting: Physician Assistant

## 2023-02-28 ENCOUNTER — Ambulatory Visit (INDEPENDENT_AMBULATORY_CARE_PROVIDER_SITE_OTHER): Payer: Medicare Other | Admitting: Physician Assistant

## 2023-02-28 VITALS — BP 166/80 | HR 73 | Temp 97.9°F | Ht 71.0 in | Wt 237.2 lb

## 2023-02-28 DIAGNOSIS — K648 Other hemorrhoids: Secondary | ICD-10-CM

## 2023-02-28 DIAGNOSIS — A09 Infectious gastroenteritis and colitis, unspecified: Secondary | ICD-10-CM

## 2023-02-28 DIAGNOSIS — Z86010 Personal history of colonic polyps: Secondary | ICD-10-CM

## 2023-02-28 MED ORDER — HYDROCORTISONE ACETATE 25 MG RE SUPP: 25.0000 mg | Freq: Two times a day (BID) | RECTAL | 1 refills | Status: DC

## 2023-02-28 NOTE — Progress Notes (Signed)
Celso Amy, PA-C [address]  [address]  [place], [place] [address]  Main: [phone]  Fax: [phone]   Gastroenterology Consultation  Referring Provider:     Bosie Clos, MD Primary Care Physician:  Bosie Clos, MD Primary Gastroenterologist:  Dr. Midge Minium  Reason for Consultation:     Diarrhea        HPI:   Dustin [name] is a 67 y.o. y/o male referred for consultation & management  by Bosie Clos, MD.    He saw his PCP, Dr. Sullivan Lone, to evaluate diarrhea on 02/08/2023.  Was having acute watery diarrhea for 6 days.  He was treated empirically with metronidazole 500 mg twice daily for 10 days.  Diarrhea improved.  He has not had any stool studies.  Currently his diarrhea has resolved.  His girlfriend had diarrhea before he had diarrhea.  He has well water.  No foreign travel or new medications.  He reports intermittent hemorrhoids symptoms.  Feels internal hemorrhoid comes out on occasion.  It flared up when he had the diarrhea.  He is asking about treatment.  Labs 01/14/2023 showed normal CBC and TSH.  Normal LFTs.  Stage [name] chronic kidney disease with creatinine 1.5, BUN 18, GFR 51.  Normal electrolytes.  Last colonoscopy 05/2022 by Dr. Servando Snare showed 2 small (4 mm tubular adenoma and 5mm sessile serrated) polyps removed, pandiverticulosis, and small internal hemorrhoids.  EGD 05/2022 showed irregular Z-line at the GE junction.  Esophagus dilated to 18 mm.  Normal stomach and duodenum.  He is negative for EOE and Barrett's.    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    Past Surgical History:  Procedure Laterality Date   ABDOMINAL EXPLORATION SURGERY  1978   APPENDECTOMY     at same time as  lymph node dissection   CARPAL TUNNEL RELEASE Right    COLONOSCOPY WITH PROPOFOL N/A 05/13/2022   Procedure: COLONOSCOPY WITH PROPOFOL;  Surgeon: Midge Minium, MD;  Location: [facility];  Service: Endoscopy;  Laterality: N/A;   ESOPHAGOGASTRODUODENOSCOPY  12/2006   and colon   ESOPHAGOGASTRODUODENOSCOPY (EGD) WITH PROPOFOL Bilateral 05/13/2022   Procedure: ESOPHAGOGASTRODUODENOSCOPY (EGD) WITH PROPOFOL with balloon dilation;  Surgeon: Midge Minium, MD;  Location: [facility];  Service: Endoscopy;  Laterality: Bilateral;   INGUINAL HERNIA REPAIR  child   bilateral   JOINT REPLACEMENT     LEFT HEART CATH AND CORONARY ANGIOGRAPHY Left 12/23/2021   Procedure: LEFT HEART CATH AND CORONARY ANGIOGRAPHY;  Surgeon: Marcina Millard, MD;  Location: [facility];  Service: Cardiovascular;  Laterality: Left;   MOHS SURGERY Left nose   at [facility]--- 2013   prolactinoma excision  06/2003   [facility]   right testicle removed  1978   SHOULDER OPEN ROTATOR CUFF REPAIR Left 03/2016   and biceps repair--[facility]   stress nuclear  negative   10/09   TONSILLECTOMY      Prior to Admission medications   Medication Sig Start Date End Date Taking? Authorizing Provider  ALPRAZolam Prudy Feeler) 0.5 MG tablet TAKE ONE TABLET BY MOUTH EVERY NIGHT AT BEDTIME AS NEEDED FOR ANXIETY 11/12/22   Alfredia Ferguson, PA-C  Brimonidine Tartrate (LUMIFY) 0.025 % SOLN Place 1 drop into both eyes daily.    [provider]  buPROPion (WELLBUTRIN XL) 300 MG 24 hr tablet Take 1 tablet (300 mg total) by mouth daily. 01/21/22   Bosie Clos, MD  GEMTESA 75 MG TABS TAKE 1 TABLET BY MOUTH DAILY 12/02/22   Stoioff, Verna Czech, MD  metoprolol succinate (TOPROL-XL) 50 MG 24 hr tablet Take 50 mg by mouth daily. 10/03/21   [provider]  omeprazole (PRILOSEC) 40 MG capsule TAKE ONE CAPSULE BY MOUTH TWICE A DAY 09/25/21   Sherlene Shams, MD  rosuvastatin (CRESTOR) 5 MG tablet Take 5 mg by mouth every  other day. 06/11/21   [provider]  sildenafil (REVATIO) 20 MG tablet Take 20 mg by mouth daily.    [provider]  silodosin (RAPAFLO) 8 MG CAPS capsule TAKE 1 CAPSULE BY MOUTH DAILY WITH BREAKFAST 12/14/22   Vaillancourt, Samantha, PA-C  valsartan-hydrochlorothiazide (DIOVAN-HCT) 320-12.5 MG tablet Take 1 tablet by mouth daily. Take 1/2 tablet in evening 09/13/22   [provider]    Family History  Problem Relation Age of Onset   AAA (abdominal aortic aneurysm) Father    Brain cancer Father    Cancer Paternal Aunt        lung   Cancer Other        strong in family   Cancer Brother        melanoma   Cerebral aneurysm Paternal Grandfather    Osteoporosis Mother      Social History   Tobacco Use   Smoking status: Former    Packs/day: 1.50    Years: 40.00    Additional pack years: 0.00    Total pack years: 60.00    Types: Cigarettes    Quit date: 04/25/2012    Years since quitting: 10.8   Smokeless tobacco: Never  Vaping Use   Vaping Use: Never used  Substance Use Topics   Alcohol use: Yes    Comment: occasional   Drug use: Yes    Types: Marijuana    Comment: When BP is elevated. maybe twice a month    Allergies as of 02/28/2023 - Review Complete 12/22/2022  Allergen Reaction Noted   Amlodipine Swelling 12/06/2016   Sulfa antibiotics  05/10/2022   Varenicline tartrate      Review of Systems:    All systems reviewed and negative except where noted in HPI.   Physical Exam:  There were no vitals taken for this visit. No LMP for male patient. Psych:  Alert and cooperative. Normal mood and affect. General:   Alert,  Well-developed, well-nourished, pleasant and cooperative in NAD Head:  Normocephalic and atraumatic. Eyes:  Sclera clear, no icterus.   Conjunctiva pink. Neck:  Supple; no masses or thyromegaly. Lungs:  Respirations even and unlabored.  Clear throughout to auscultation.   No wheezes, crackles, or rhonchi. No acute  distress. Heart:  Regular rate and rhythm; no murmurs, clicks, rubs, or gallops. Abdomen:  Normal bowel sounds.  No bruits.  Soft, and obese without masses, hepatosplenomegaly or hernias noted.  No Tenderness.  No guarding or rebound tenderness.    Neurologic:  Alert and oriented x3;  grossly normal neurologically. Psych:  Alert and cooperative. Normal mood and affect.  Imaging Studies: No results found.  Assessment and Plan:   ANCLE MORELAN [name] is a 67 y.o. y/o male has been referred for acute diarrhea.  I suspect he had norovirus or  rotavirus that spontaneously resolved.  He has no more current diarrhea.  He has had some flareup of internal hemorrhoids and we discussed treatment.  Colonoscopy is up-to-date.  Acute Diarrhea - Currently Resolved  Suspect he had Norovirus or Rotavirus which has resolved.  Recommend Florastor Probiotic 250mg  1 capsule BID any time he has to take antibiotics.  If he has recurrent diarrhea, he will let us know, and then I will order GI Pathogen Panal.  Internal Hemorrhoids  Discussed treatment for hemorrhoids at length. For Internal Hemorrhoids: Rx Hydrocortisone Suppositories 25mg  Insert 1 into rectum once daily at bedtime for 10-14 days. Discussed Internal Hemorrhoid Banding if no improvement with conservative treament.   History of Colon Polyps  5-year repeat colonoscopy will be due 05/2027.  Follow up in as needed if he has any recurrent GI symptoms.  Celso Amy, PA-C

## 2023-02-28 NOTE — Patient Instructions (Signed)
Hemorrhoids Hemorrhoids are swollen veins in and around the rectum or the opening of the butt (anus). There are two types of hemorrhoids: Internal. These occur in the veins just inside the rectum. They may poke through to the outside and become irritated and painful. External. These occur in the veins outside the anus. They can be felt as a painful swelling or hard lump near the anus. Most hemorrhoids do not cause severe problems. Often, they can be treated at home with diet and lifestyle changes. If home treatments do not help, you may need a procedure to shrink or remove the hemorrhoids. What are the causes? Hemorrhoids are caused by pressure near the anus. This pressure may be caused by: Constipation or diarrhea. Straining to poop. Pregnancy. Obesity. Sitting or riding a bike for a long time. Heavy lifting or other things that cause you to strain. Anal sex. What are the signs or symptoms? Symptoms of this condition include: Pain. Anal itching or irritation. Bleeding from the rectum. Leakage of poop (stool). Swelling of the anus. One or more lumps around the anus. How is this diagnosed? Hemorrhoids can often be diagnosed through a visual exam. Other exams or tests may also be done, such as: A digital rectal exam. This is when your health care provider feels inside your rectum with a gloved finger. Anoscope. This is an exam of the anus using a small tube. A blood test, if you have lost a lot of blood. A sigmoidoscopy or colonoscopy. These are tests to look inside the colon using a tube with a camera on the end. How is this treated? In most cases, hemorrhoids can be treated at home with diet and lifestyle changes. If these changes do not help, you may need to have a procedure done. These procedures can make the hemorrhoids smaller or fully remove them. Common procedures include: Rubber band ligation. Rubber bands are placed at the base of the hemorrhoids to cut off their blood  supply. Sclerotherapy. Medicine is put into the hemorrhoids to shrink them. Infrared coagulation. A type of light energy is used to get rid of the hemorrhoids. Hemorrhoidectomy surgery. The hemorrhoids are removed during surgery. Then, the veins that supply them are tied off. Stapled hemorrhoidopexy surgery. The base of the hemorrhoid is stapled to the wall of the rectum. Follow these instructions at home: Medicines Take over-the-counter and prescription medicines only as told by your provider. Use medicated creams or medicines that are put in the rectum (suppositories) as told by your provider. Eating and drinking  Eat foods that are high in fiber, such as beans, whole grains, and fresh fruits and vegetables. Ask your provider about taking products that have fiber added to them (fiber supplements). Reduce the amount of fat in your diet. You can do this by eating low-fat dairy products, eating less red meat, and avoiding processed foods. Drink enough fluid to keep your pee (urine) pale yellow. Managing pain and swelling  Take warm sitz baths for 20 minutes, 3-4 times a day. This can help ease pain and discomfort. You may do this in a bathtub or you can use a portable sitz bath that fits over the toilet. If told, put ice on the affected area. It may help to use ice packs between sitz baths. Put ice in a plastic bag. Place a towel between your skin and the bag. Leave the ice on for 20 minutes, 2-3 times a day. If your skin turns bright red, remove the ice right away to prevent   skin damage. The risk of damage is higher if you cannot feel pain, heat, or cold. General instructions Exercise. Ask your provider how much and what kind of exercise is best for you. In general, you should do moderate exercise for at least 30 minutes on most days of the week (150 minutes each week). You may want to try walking, biking, or yoga. Go to the bathroom when you have the urge to poop. Do not wait. Avoid  straining to poop. Keep the anus dry and clean. Use wet toilet paper or moist towelettes after you poop. Do not sit on the toilet for a long time. This can increase blood pooling and pain. Where to find more information National Institute of Diabetes and Digestive and Kidney Diseases: niddk.nih.gov Contact a health care provider if: You have more pain and swelling that do not get better with treatment. You have trouble pooping or you are not able to poop. You have pain or inflammation outside the area of the hemorrhoids. Get help right away if: You are bleeding from your rectum and you cannot get it to stop. This information is not intended to replace advice given to you by your health care provider. Make sure you discuss any questions you have with your health care provider. Document Revised: 06/09/2022 Document Reviewed: 06/09/2022 Elsevier Patient Education  2023 Elsevier Inc.  

## 2023-03-01 ENCOUNTER — Other Ambulatory Visit: Payer: Self-pay | Admitting: Urology

## 2023-03-07 ENCOUNTER — Other Ambulatory Visit: Payer: Self-pay | Admitting: Urology

## 2023-03-09 ENCOUNTER — Encounter (HOSPITAL_COMMUNITY): Payer: Self-pay

## 2023-03-14 ENCOUNTER — Ambulatory Visit: Payer: Medicare Other

## 2023-03-17 ENCOUNTER — Other Ambulatory Visit: Payer: Self-pay | Admitting: Physician Assistant

## 2023-03-17 NOTE — Patient Instructions (Signed)
DUE TO COVID-19 ONLY TWO VISITORS  (aged 68 and older)  ARE ALLOWED TO COME WITH YOU AND STAY IN THE WAITING ROOM ONLY DURING PRE OP AND PROCEDURE.   **NO VISITORS ARE ALLOWED IN THE SHORT STAY AREA OR RECOVERY ROOM!!**  IF YOU WILL BE ADMITTED INTO THE HOSPITAL YOU ARE ALLOWED ONLY FOUR SUPPORT PEOPLE DURING VISITATION HOURS ONLY (7 AM -8PM)   The support person(s) must pass our screening, gel in and out, and wear a mask at all times, including in the patient's room. Patients must also wear a mask when staff or their support person are in the room. Visitors GUEST BADGE MUST BE WORN VISIBLY  One adult visitor may remain with you overnight and MUST be in the room by 8 P.M.     Your procedure is scheduled on: 03/25/23   Report to [facility] Main Entrance    Report to admitting at : 11:00 AM   Call this number if you have problems the morning of surgery [phone]   Do not eat food :After Midnight.   After Midnight you may have the following liquids until : 10:00 AM DAY OF SURGERY  Water Black Coffee (sugar ok, NO MILK/CREAM OR CREAMERS)  Tea (sugar ok, NO MILK/CREAM OR CREAMERS) regular and decaf                             Plain Jell-O (NO RED)                                           Fruit ices (not with fruit pulp, NO RED)                                     Popsicles (NO RED)                                                                  Juice: apple, WHITE grape, WHITE cranberry Sports drinks like Gatorade (NO RED)               Oral Hygiene is also important to reduce your risk of infection.                                    Remember - BRUSH YOUR TEETH THE MORNING OF SURGERY WITH YOUR REGULAR TOOTHPASTE  DENTURES WILL BE REMOVED PRIOR TO SURGERY PLEASE DO NOT APPLY "Poly grip" OR ADHESIVES!!!   Do NOT smoke after Midnight   Take these medicines the morning of surgery with A SIP OF WATER: bupropion,metoprolol,omeprazole.  DO NOT TAKE ANY ORAL DIABETIC  MEDICATIONS DAY OF YOUR SURGERY                              You may not have any metal on your body including hair pins, jewelry, and body piercing  Do not wear lotions, powders, perfumes/cologne, or deodorant              Men may shave face and neck.   Do not bring valuables to the hospital. [facility] IS NOT             RESPONSIBLE   FOR VALUABLES.   Contacts, glasses, or bridgework may not be worn into surgery.   Bring small overnight bag day of surgery.   DO NOT BRING YOUR HOME MEDICATIONS TO THE HOSPITAL. PHARMACY WILL DISPENSE MEDICATIONS LISTED ON YOUR MEDICATION LIST TO YOU DURING YOUR ADMISSION IN THE HOSPITAL!    Patients discharged on the day of surgery will not be allowed to drive home.  Someone NEEDS to stay with you for the first 24 hours after anesthesia.   Special Instructions: Bring a copy of your healthcare power of attorney and living will documents         the day of surgery if you haven't scanned them before.              Please read over the following fact sheets you were given: IF YOU HAVE QUESTIONS ABOUT YOUR PRE-OP INSTRUCTIONS PLEASE CALL [phone]    [facility] - Preparing for Surgery Before surgery, you can play an important role.  Because skin is not sterile, your skin needs to be as free of germs as possible.  You can reduce the number of germs on your skin by washing with CHG (chlorahexidine gluconate) soap before surgery.  CHG is an antiseptic cleaner which kills germs and bonds with the skin to continue killing germs even after washing. Please DO NOT use if you have an allergy to CHG or antibacterial soaps.  If your skin becomes reddened/irritated stop using the CHG and inform your nurse when you arrive at Short Stay. Do not shave (including legs and underarms) for at least 48 hours prior to the first CHG shower.  You may shave your face/neck. Please follow these instructions carefully:  1.  Shower with CHG Soap the night before surgery  and the  morning of Surgery.  2.  If you choose to wash your hair, wash your hair first as usual with your  normal  shampoo.  3.  After you shampoo, rinse your hair and body thoroughly to remove the  shampoo.                           4.  Use CHG as you would any other liquid soap.  You can apply chg directly  to the skin and wash                       Gently with a scrungie or clean washcloth.  5.  Apply the CHG Soap to your body ONLY FROM THE NECK DOWN.   Do not use on face/ open                           Wound or open sores. Avoid contact with eyes, ears mouth and genitals (private parts).                       Wash face,  Genitals (private parts) with your normal soap.             6.  Wash thoroughly, paying special attention to the area where your surgery  will be performed.  7.  Thoroughly rinse your body with warm water from the neck down.  8.  DO NOT shower/wash with your normal soap after using and rinsing off  the CHG Soap.                9.  Pat yourself dry with a clean towel.            10.  Wear clean pajamas.            11.  Place clean sheets on your bed the night of your first shower and do not  sleep with pets. Day of Surgery : Do not apply any lotions/deodorants the morning of surgery.  Please wear clean clothes to the hospital/surgery center.  FAILURE TO FOLLOW THESE INSTRUCTIONS MAY RESULT IN THE CANCELLATION OF YOUR SURGERY PATIENT SIGNATURE_________________________________  NURSE SIGNATURE__________________________________  ________________________________________________________________________

## 2023-03-21 ENCOUNTER — Other Ambulatory Visit: Payer: Self-pay

## 2023-03-21 ENCOUNTER — Encounter (HOSPITAL_COMMUNITY)
Admission: RE | Admit: 2023-03-21 | Discharge: 2023-03-21 | Disposition: A | Discharge status: Home / Self Care | Payer: Medicare Other | Source: Ambulatory Visit | Attending: Urology | Admitting: Urology

## 2023-03-21 ENCOUNTER — Encounter (HOSPITAL_COMMUNITY): Payer: Self-pay

## 2023-03-21 VITALS — BP 166/91 | HR 67 | Temp 97.8°F | Ht 70.0 in | Wt 234.0 lb

## 2023-03-21 DIAGNOSIS — R350 Frequency of micturition: Secondary | ICD-10-CM | POA: Insufficient documentation

## 2023-03-21 DIAGNOSIS — R7303 Prediabetes: Secondary | ICD-10-CM | POA: Diagnosis not present

## 2023-03-21 DIAGNOSIS — I2510 Atherosclerotic heart disease of native coronary artery without angina pectoris: Secondary | ICD-10-CM | POA: Diagnosis not present

## 2023-03-21 DIAGNOSIS — I129 Hypertensive chronic kidney disease with stage 1 through stage 4 chronic kidney disease, or unspecified chronic kidney disease: Secondary | ICD-10-CM | POA: Diagnosis not present

## 2023-03-21 DIAGNOSIS — L539 Erythematous condition, unspecified: Secondary | ICD-10-CM | POA: Insufficient documentation

## 2023-03-21 DIAGNOSIS — Z8616 Personal history of COVID-19: Secondary | ICD-10-CM | POA: Diagnosis not present

## 2023-03-21 DIAGNOSIS — I7121 Aneurysm of the ascending aorta, without rupture: Secondary | ICD-10-CM | POA: Insufficient documentation

## 2023-03-21 DIAGNOSIS — N183 Chronic kidney disease, stage 3 unspecified: Secondary | ICD-10-CM | POA: Diagnosis not present

## 2023-03-21 DIAGNOSIS — I10 Essential (primary) hypertension: Secondary | ICD-10-CM

## 2023-03-21 DIAGNOSIS — Z01812 Encounter for preprocedural laboratory examination: Secondary | ICD-10-CM | POA: Insufficient documentation

## 2023-03-21 HISTORY — DX: Dyspnea, unspecified: R06.00

## 2023-03-21 HISTORY — DX: Atherosclerotic heart disease of native coronary artery without angina pectoris: I25.10

## 2023-03-21 LAB — BASIC METABOLIC PANEL
Anion gap: 9 (ref 5–15)
BUN: 15 mg/dL (ref 8–23)
CO2: 26 mmol/L (ref 22–32)
Calcium: 8.6 mg/dL — ABNORMAL LOW (ref 8.9–10.3)
Chloride: 102 mmol/L (ref 98–111)
Creatinine, Ser: 1.26 mg/dL — ABNORMAL HIGH (ref 0.61–1.24)
GFR, Estimated: >60 mL/min (ref >60)
Glucose, Bld: 90 mg/dL (ref 70–99)
Potassium: 3.8 mmol/L (ref 3.5–5.1)
Sodium: 137 mmol/L (ref 135–145)

## 2023-03-21 LAB — CBC
HCT: 46.3 % (ref 39.0–52.0)
Hemoglobin: 15.9 g/dL (ref 13.0–17.0)
MCH: 31.4 pg (ref 26.0–34.0)
MCHC: 34.3 g/dL (ref 30.0–36.0)
MCV: 91.3 fL (ref 80.0–100.0)
Platelets: 169 10*3/uL (ref 150–400)
RBC: 5.07 MIL/uL (ref 4.22–5.81)
RDW: 12.2 % (ref 11.5–15.5)
WBC: 5.7 10*3/uL (ref 4.0–10.5)
nRBC: 0 % (ref 0.0–0.2)

## 2023-03-21 NOTE — Progress Notes (Addendum)
For Short Stay: COVID SWAB appointment date:  Bowel Prep reminder: N/A   For Anesthesia: PCP - Dr. Julieanne Manson Cardiologist - Dr. Marcina Millard. LOV: 02/21/23  Chest x-ray - 03/27/22 EKG - 03/29/22 Stress Test -  ECHO - 11/25/16 Cardiac Cath - 12/23/21 Pacemaker/ICD device last checked: Pacemaker orders received: Device Rep notified:  Spinal Cord Stimulator: N/A  Sleep Study - N/A CPAP -   Fasting Blood Sugar - N/A Checks Blood Sugar _____ times a day Date and result of last Hgb A1c- 5.8: 01/14/23  Last dose of GLP1 agonist- N/A GLP1 instructions:   Last dose of SGLT-2 inhibitors- N/A SGLT-2 instructions:   Blood Thinner Instructions: N/A Aspirin Instructions: Last Dose:  Activity level: Can go up a flight of stairs and activities of daily living without stopping and without chest pain and/or shortness of breath   Able to exercise without chest pain and/or shortness of breath  Anesthesia review: Hx: HTN,CAD.  Patient denies shortness of breath, fever, cough and chest pain at PAT appointment   Patient verbalized understanding of instructions that were given to them at the PAT appointment. Patient was also instructed that they will need to review over the PAT instructions again at home before surgery.

## 2023-03-22 NOTE — Anesthesia Preprocedure Evaluation (Addendum)
Anesthesia Evaluation  Patient identified by MRN, date of birth, ID band Patient awake    Reviewed: Allergy & Precautions, H&P , NPO status , Patient's Chart, lab work & pertinent test results  Airway Mallampati: II   Neck ROM: full    Dental   Pulmonary shortness of breath, former smoker   breath sounds clear to auscultation       Cardiovascular hypertension, + CAD and + Peripheral Vascular Disease   Rhythm:regular Rate:Normal     Neuro/Psych  Headaches PSYCHIATRIC DISORDERS Anxiety Depression       GI/Hepatic ,GERD  ,,  Endo/Other    Renal/GU Renal InsufficiencyRenal disease     Musculoskeletal  (+) Arthritis ,    Abdominal   Peds  Hematology   Anesthesia Other Findings   Reproductive/Obstetrics                             Anesthesia Physical Anesthesia Plan  ASA: 3  Anesthesia Plan: General   Post-op Pain Management:    Induction: Intravenous  PONV Risk Score and Plan: 2 and Ondansetron, Dexamethasone and Treatment may vary due to age or medical condition  Airway Management Planned: LMA  Additional Equipment:   Intra-op Plan:   Post-operative Plan: Extubation in OR  Informed Consent: I have reviewed the patients History and Physical, chart, labs and discussed the procedure including the risks, benefits and alternatives for the proposed anesthesia with the patient or authorized representative who has indicated his/her understanding and acceptance.     Dental advisory given  Plan Discussed with: CRNA, Anesthesiologist and Surgeon  Anesthesia Plan Comments: (See PAT note on 7/5)       Anesthesia Quick Evaluation

## 2023-03-22 NOTE — Progress Notes (Signed)
Anesthesia Chart Review   Case: [id number] Date/Time: 03/25/23 1300   Procedures:      CYSTOSCOPY WITH BIOPSY/ TRANSURETHRAL RESECTION OF BLADDER TUMOR     CYSTOSCOPY WITH BILATERAL RETROGRADE PYELOGRAM (Bilateral) - 60 MINS FOR CASES   Anesthesia type: General   Pre-op diagnosis: BLADDER  ERYTHEMIA,  FREQUENCY   Location: [facility] PROC[facility]URE [facility] / [facility]   Surgeons: Jerilee Field, MD       DISCUSSION:67 y.o. former smoker with h/o HTN, CAD (moderate disease on cath 2023, no intervention), CKD stage III, bladder tumor scheduled for above procedure 03/25/2023 with Dr. Jerilee Field.   Pt seen by vascular surgery 12/20/2022 for evaluation of ascending thoracic aortic aneurysm.  Per office visit note, "The patient has an asymptomatic thoracic aortic aneurysm that is less than 6.0 cm in maximal diameter. I have discussed the natural history of thoracic aortic aneurysm and the small risk of rupture for aneurysm less than 6.5 cm in size. However, as these small aneurysms tend to enlarge over time, continued surveillance with CT scan is mandatory."  CT angio 2/27/2024shows an ascending TAA that measures 4.5 cm.  Patient last seen by cardiology 02/21/2023.  Coronary artery calcium score 138 on 11/20/2020 which is 61 percentile. Patient has a history of statin intolerance, and Repatha was not covered, and Zetia prescribed. Cardiac CT with FFR was performed 02/12/2021 which revealed 50% stenosis RCA, FFR 0.9, moderate stenosis left circumflex, FFR 0.89, LAD FFR 0.85. The patient underwent cardiac catheterization on 12/23/2021 which revealed chronically occluded small to moderate caliber OM1, 40% stenosis proximal-mid left circumflex, and 30% stenosis mid RCA. Stable at this visit with no changes made.  41-month follow-up recommended. VS: BP (!) 166/91 Comment: pr. did not take BP meds this AM.  Pulse 67   Temp 36.6 C (Oral)   Ht 5\' 10"  (1.778 m)   Wt 106.1 kg   SpO2 96%   BMI 33.58 kg/m    PROVIDERS: Bosie Clos, MD is PCP   Marcina Millard, MD is cardiologist LABS: Labs reviewed: Acceptable for surgery. (all labs ordered are listed, but only abnormal results are displayed)  Labs Reviewed  BASIC METABOLIC PANEL - Abnormal; Notable for the following components:      Result Value   Creatinine, Ser 1.26 (*)    Calcium 8.6 (*)    All other components within normal limits  CBC     IMAGES: CT Angio 12/07/2022 IMPRESSION: There does appear to be grossly stable focal fusiform aneurysmal dilatation of the ascending thoracic aorta with some degree of mural thrombus which measures 4.5 cm in diameter. This is not significantly changed compared to the prior exam based on my own measurements of prior exam. Ascending thoracic aortic aneurysm. Recommend semi-annual imaging followup by CTA or MRA and referral to cardiothoracic surgery if not already obtained. This recommendation follows 2010 ACCF/AHA/AATS/ACR/ASA/SCA/SCAI/SIR/STS/SVM Guidelines for the Diagnosis and Management of Patients With Thoracic Aortic Disease. Circulation. 2010; 121: A540-J811. Aortic aneurysm NOS (ICD10-I71.9).  EKG:   CV: Cardiac Cath 12/23/2021   Prox Cx to Mid Cx lesion is 40% stenosed.   Mid RCA lesion is 30% stenosed.   1st Mrg lesion is 100% stenosed.   The left ventricular systolic function is normal.   The left ventricular ejection fraction is 55-65% by visual estimate.   1.  One-vessel coronary artery disease with chronically occluded small caliber OM1 2.  Normal left ventricular function Recommendations: 1.  Medical therapy 2.  Aggressive risk factor modification  Echo  11/25/2016 - Left ventricle: The cavity size was normal. Wall thickness was    normal. Systolic function was vigorous. The estimated ejection    fraction was in the range of 65% to 70%. Wall motion was normal;    there were no regional wall motion abnormalities. Left    ventricular diastolic function  parameters were normal for the    patient&'s age.   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    Past Surgical History:  Procedure Laterality Date   ABDOMINAL EXPLORATION SURGERY  1978   APPENDECTOMY     at same time as lymph node dissection   CARPAL TUNNEL RELEASE Right    COLONOSCOPY WITH PROPOFOL N/A 05/13/2022   Procedure: COLONOSCOPY WITH PROPOFOL;  Surgeon: Midge Minium, MD;  Location: [facility];  Service: Endoscopy;  Laterality: N/A;   ESOPHAGOGASTRODUODENOSCOPY  12/2006   and colon   ESOPHAGOGASTRODUODENOSCOPY (EGD) WITH PROPOFOL Bilateral 05/13/2022   Procedure: ESOPHAGOGASTRODUODENOSCOPY (EGD) WITH PROPOFOL with balloon dilation;  Surgeon: Midge Minium, MD;  Location: [facility];  Service: Endoscopy;  Laterality: Bilateral;   INGUINAL HERNIA REPAIR  child   bilateral   JOINT REPLACEMENT     LEFT HEART CATH AND CORONARY ANGIOGRAPHY Left 12/23/2021   Procedure: LEFT HEART CATH AND CORONARY ANGIOGRAPHY;  Surgeon: Marcina Millard, MD;  Location: [facility] INVASIVE CV LAB;  Service: Cardiovascular;  Laterality: Left;   MOHS SURGERY Left nose   at [facility]--- 2013   prolactinoma excision  06/2003   [facility]   right testicle removed  1978   SHOULDER OPEN ROTATOR CUFF REPAIR Left 03/2016   and biceps repair--[facility]   stress nuclear  negative   10/09   TONSILLECTOMY      MEDICATIONS:  ALPRAZolam (XANAX) 0.5 MG tablet   Brimonidine Tartrate (LUMIFY) 0.025 % SOLN   buPROPion (WELLBUTRIN XL) 300 MG 24 hr tablet   GEMTESA 75 MG TABS   hydrocortisone (ANUSOL-HC) 25 MG suppository   metoprolol succinate (TOPROL-XL) 50 MG 24 hr tablet   omeprazole (PRILOSEC) 40 MG capsule    rosuvastatin (CRESTOR) 5 MG tablet   silodosin (RAPAFLO) 8 MG CAPS capsule   tadalafil (CIALIS) 5 MG tablet   valsartan-hydrochlorothiazide (DIOVAN-HCT) 320-12.5 MG tablet   No current facility-administered medications for this encounter.   Jodell Cipro Ward, PA-C [facility] [phone]

## 2023-03-24 ENCOUNTER — Telehealth: Payer: Self-pay

## 2023-03-24 ENCOUNTER — Other Ambulatory Visit: Payer: Self-pay

## 2023-03-24 NOTE — Telephone Encounter (Signed)
LVM for patient to return call. Calling to confirm if he is not  sildosoin.

## 2023-03-24 NOTE — Telephone Encounter (Signed)
Received medication refill request via fax from [facility] pharmacy for Silodosin 8 mg capsule. Per chart pt is being see by [facility] now. No future appointments with Dr.Stoioff. per chart pt stopped this medication, per Dr.Eskridge advise.see other phone note left message to confirm with pt.

## 2023-03-25 ENCOUNTER — Encounter (HOSPITAL_COMMUNITY): Payer: Self-pay

## 2023-04-04 ENCOUNTER — Ambulatory Visit (INDEPENDENT_AMBULATORY_CARE_PROVIDER_SITE_OTHER): Payer: BLUE CROSS/BLUE SHIELD | Admitting: Vascular Surgery

## 2023-04-04 ENCOUNTER — Encounter (INDEPENDENT_AMBULATORY_CARE_PROVIDER_SITE_OTHER): Payer: Medicare Other

## 2023-04-12 NOTE — Progress Notes (Addendum)
PCP - Julieanne Manson [facility]  LOV 04-13-23 03-28-23 LOV [place] epic Cardiologist - Marcina Millard [facility] 02-21-23  PPM/ICD -  Device Orders -  Rep Notified -   Chest x-ray - CTA chest 12-07-22 epic EKG - 03-29-22 epic Stress Test -  ECHO - 2018 Cardiac Cath - 12-23-21 epic CT cardiac scoring 2022   Sleep Study -  CPAP -   Fasting Blood Sugar -  Checks Blood Sugar _____ times a day  Blood Thinner Instructions: Aspirin Instructions:  ERAS Protcol - PRE-SURGERY -    COVID vaccine -yes HOLD SEMAGLUTIDE ONE WEEK  Has not started yet will start after surgery  Activity--Able to climb a flight of stairs without SOB or CP  Anesthesia review: Recent bronchitis completed treatmentCAD, HTN, Aortic aneurysm 4.5 cm , PAD PAVD. CKD   Patient denies shortness of breath, fever, cough and chest pain at PAT appointment   All instructions explained to the patient, with a verbal understanding of the material. Patient agrees to go over the instructions while at home for a better understanding. Patient also instructed to self quarantine after being tested for COVID-19. The opportunity to ask questions was provided.

## 2023-04-12 NOTE — Patient Instructions (Addendum)
SURGICAL WAITING ROOM VISITATION  Patients having surgery or a procedure may have no more than 2 support people in the waiting area - these visitors may rotate.    Children under the age of 41 must have an adult with them who is not the patient.   If the patient needs to stay at the hospital during part of their recovery, the visitor guidelines for inpatient rooms apply. Pre-op nurse will coordinate an appropriate time for 1 support person to accompany patient in pre-op.  This support person may not rotate.    Please refer to the [facility] website for the visitor guidelines for Inpatients (after your surgery is over and you are in a regular room).       Your procedure is scheduled on: 04-22-23   Report to [facility] Main Entrance    Report to admitting at      0845   AM   Call this number if you have problems the morning of surgery [phone]   Do not eat food or liquids :After Midnight.              If you have questions, please contact your surgeon's office.   FOLLOW  ANY ADDITIONAL PRE OP INSTRUCTIONS YOU RECEIVED FROM YOUR SURGEON'S OFFICE!!!     Oral Hygiene is also important to reduce your risk of infection.                                    Remember - BRUSH YOUR TEETH THE MORNING OF SURGERY WITH YOUR REGULAR TOOTHPASTE  DENTURES WILL BE REMOVED PRIOR TO SURGERY PLEASE DO NOT APPLY "Poly grip" OR ADHESIVES!!!   Do NOT smoke after Midnight   Take these medicines the morning of surgery with A SIP OF WATER: METOPROLOL, OMEPRAZOLE, GEMESTA IF IT IS YOUR DAY, BUPROPION, EYE DROPS AS USUAL, INHALER BRING WITH YOU  DO NOT TAKE ANY ORAL DIABETIC MEDICATIONS DAY OF YOUR SURGERY  Bring CPAP mask and tubing day of surgery.                              You may not have any metal on your body including hair pins, jewelry, and body piercing             Do not wear  lotions, powders, perfumes/cologne, or deodorant                Men may shave face and  neck.   Do not bring valuables to the hospital. [place] IS NOT             RESPONSIBLE   FOR VALUABLES.   Contacts, glasses, dentures or bridgework may not be worn into surgery.   Bring small overnight bag day of surgery.   DO NOT BRING YOUR HOME MEDICATIONS TO THE HOSPITAL. PHARMACY WILL DISPENSE MEDICATIONS LISTED ON YOUR MEDICATION LIST TO YOU DURING YOUR ADMISSION IN THE HOSPITAL!    Patients discharged on the day of surgery will not be allowed to drive home.  Someone NEEDS to stay with you for the first 24 hours after anesthesia.   Special Instructions: Bring a copy of your healthcare power of attorney and living will documents the day of surgery if you haven't scanned them before.              Please read over the  following fact sheets you were given: IF YOU HAVE QUESTIONS ABOUT YOUR PRE-OP INSTRUCTIONS PLEASE CALL [phone]   . If you test positive for Covid or have been in contact with anyone that has tested positive in the last 10 days please notify you surgeon.    [facility] - Preparing for Surgery Before surgery, you can play an important role.  Because skin is not sterile, your skin needs to be as free of germs as possible.  You can reduce the number of germs on your skin by washing with CHG (chlorahexidine gluconate) soap before surgery.  CHG is an antiseptic cleaner which kills germs and bonds with the skin to continue killing germs even after washing. Please DO NOT use if you have an allergy to CHG or antibacterial soaps.  If your skin becomes reddened/irritated stop using the CHG and inform your nurse when you arrive at Short Stay. Do not shave (including legs and underarms) for at least 48 hours prior to the first CHG shower.  You may shave your face/neck. Please follow these instructions carefully:  1.  Shower with CHG Soap the night before surgery and the  morning of Surgery.  2.  If you choose to wash your hair, wash your hair first as usual with your  normal   shampoo.  3.  After you shampoo, rinse your hair and body thoroughly to remove the  shampoo.                           4.  Use CHG as you would any other liquid soap.  You can apply chg directly  to the skin and wash                       Gently with a scrungie or clean washcloth.  5.  Apply the CHG Soap to your body ONLY FROM THE NECK DOWN.   Do not use on face/ open                           Wound or open sores. Avoid contact with eyes, ears mouth and genitals (private parts).                       Wash face,  Genitals (private parts) with your normal soap.             6.  Wash thoroughly, paying special attention to the area where your surgery  will be performed.  7.  Thoroughly rinse your body with warm water from the neck down.  8.  DO NOT shower/wash with your normal soap after using and rinsing off  the CHG Soap.                9.  Pat yourself dry with a clean towel.            10.  Wear clean pajamas.            11.  Place clean sheets on your bed the night of your first shower and do not  sleep with pets. Day of Surgery : Do not apply any lotions/deodorants the morning of surgery.  Please wear clean clothes to the hospital/surgery center.  FAILURE TO FOLLOW THESE INSTRUCTIONS MAY RESULT IN THE CANCELLATION OF YOUR SURGERY PATIENT SIGNATURE_________________________________  NURSE SIGNATURE__________________________________  ________________________________________________________________________

## 2023-04-15 ENCOUNTER — Encounter (HOSPITAL_COMMUNITY)
Admission: RE | Admit: 2023-04-15 | Discharge: 2023-04-15 | Disposition: A | Discharge status: Home / Self Care | Payer: Medicare Other | Source: Ambulatory Visit | Attending: Urology | Admitting: Urology

## 2023-04-15 ENCOUNTER — Encounter (HOSPITAL_COMMUNITY): Payer: Self-pay

## 2023-04-15 ENCOUNTER — Other Ambulatory Visit: Payer: Self-pay

## 2023-04-15 VITALS — Ht 69.5 in | Wt 234.0 lb

## 2023-04-15 DIAGNOSIS — I10 Essential (primary) hypertension: Secondary | ICD-10-CM

## 2023-04-15 HISTORY — DX: Pneumonia, unspecified organism: J18.9

## 2023-04-15 HISTORY — DX: Gastro-esophageal reflux disease without esophagitis: K21.9

## 2023-04-15 HISTORY — DX: Headache, unspecified: R51.9

## 2023-04-15 HISTORY — DX: Aortic aneurysm of unspecified site, without rupture: I71.9

## 2023-04-15 HISTORY — DX: Chronic kidney disease, unspecified: N18.9

## 2023-04-15 NOTE — Progress Notes (Signed)
Case: [id number] Date/Time: 04/22/23 1045   Procedures:      CYSTOSCOPY WITH BIOPSY/ TRANSURETHRAL RESECTION OF BLADDER TUMOR     CYSTOSCOPY WITH BILATERAL RETROGRADE PYELOGRAM (Bilateral) - 60 MINS FOR CASES   Anesthesia type: General   Pre-op diagnosis: BLADDER  ERYTHEMIA,  FREQUENCY   Location: [facility] PROCEDURE [facility] / [facility]   Surgeons: Jerilee Field, MD       DISCUSSION: 67 y.o. former smoker with h/o HTN, CAD (moderate disease on cath 2023, no intervention), CKD stage III, bladder tumor scheduled for above procedure. Was originally scheduled for 03/25/2023 with Dr. Jerilee Field however rescheduled due to patient developing sinusitis/bronchitis requiring treatment with antibiotics and steroids. Patient has been now symptoms free for 2 weeks. Denies symptoms via telephone assessment with RN.   Pt seen by vascular surgery 12/20/2022 for evaluation of ascending thoracic aortic aneurysm.  Per office visit note, "The patient has an asymptomatic thoracic aortic aneurysm that is less than 6.0 cm in maximal diameter. I have discussed the natural history of thoracic aortic aneurysm and the small risk of rupture for aneurysm less than 6.5 cm in size. However, as these small aneurysms tend to enlarge over time, continued surveillance with CT scan is mandatory."   CT angio 12/07/2022 shows an ascending TAA that measures 4.5 cm.   Patient last seen by cardiology 02/21/2023.  Coronary artery calcium score 138 on 11/20/2020 which is 61 percentile. Patient has a history of statin intolerance, and Repatha was not covered, and Zetia prescribed. Cardiac CT with FFR was performed 02/12/2021 which revealed 50% stenosis RCA, FFR 0.9, moderate stenosis left circumflex, FFR 0.89, LAD FFR 0.85. The patient underwent cardiac catheterization on 12/23/2021 which revealed chronically occluded small to moderate caliber OM1, 40% stenosis proximal-mid left circumflex, and 30% stenosis mid RCA. Stable at this visit with no changes  made.  55-month follow-up recommended.  VS:     04/15/2023    9:18 AM 03/21/2023    8:54 AM 02/28/2023    9:12 AM  Vitals with BMI  Height 5' 9.5" 5\' 10"  5\' 11"   Weight 234 lbs 234 lbs 237 lbs 3 oz  BMI 34.07 33.58 33.1  Systolic  166 166  Diastolic  91 80  Pulse  67 73     PROVIDERS: PCP - Julieanne Manson ([facility]) Cardiologist - Paraschos, Alexander ([facility]; LOV 02-21-23)   LABS: Labs reviewed: Acceptable for surgery. (all labs ordered are listed, but only abnormal results are displayed)  Labs Reviewed - No data to display   IMAGES:  CT Angio 12/07/2022 IMPRESSION: There does appear to be grossly stable focal fusiform aneurysmal dilatation of the ascending thoracic aorta with some degree of mural thrombus which measures 4.5 cm in diameter. This is not significantly changed compared to the prior exam based on my own measurements of prior exam. Ascending thoracic aortic aneurysm. Recommend semi-annual imaging followup by CTA or MRA and referral to cardiothoracic surgery if not already obtained. This recommendation follows 2010 ACCF/AHA/AATS/ACR/ASA/SCA/SCAI/SIR/STS/SVM Guidelines for the Diagnosis and Management of Patients With Thoracic Aortic Disease. Circulation. 2010; 121: A540-J811. Aortic aneurysm NOS (ICD10-I71.9).   EKG: Obtain DOS     CV:  Cardiac Cath 12/23/2021    Prox Cx to Mid Cx lesion is 40% stenosed.   Mid RCA lesion is 30% stenosed.   1st Mrg lesion is 100% stenosed.   The left ventricular systolic function is normal.   The left ventricular ejection fraction is 55-65% by visual estimate.   1.  One-vessel  coronary artery disease with chronically occluded small caliber OM1 2.  Normal left ventricular function Recommendations: 1.  Medical therapy 2.  Aggressive risk factor modification   Echo 11/25/2016 - Left ventricle: The cavity size was normal. Wall thickness was    normal. Systolic function was vigorous. The estimated ejection    fraction was  in the range of 65% to 70%. Wall motion was normal;    there were no regional wall motion abnormalities. Left    ventricular diastolic function parameters were normal for the    patient&'s age.   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    Past Surgical History:  Procedure Laterality Date   ABDOMINAL EXPLORATION SURGERY  1978   APPENDECTOMY     at same time as lymph node dissection   CARPAL TUNNEL RELEASE Right    COLONOSCOPY WITH PROPOFOL N/A 05/13/2022   Procedure: COLONOSCOPY WITH PROPOFOL;  Surgeon: Midge Minium, MD;  Location: [facility];  Service: Endoscopy;  Laterality: N/A;   ESOPHAGOGASTRODUODENOSCOPY  12/2006   and colon   ESOPHAGOGASTRODUODENOSCOPY (EGD) WITH PROPOFOL Bilateral 05/13/2022   Procedure: ESOPHAGOGASTRODUODENOSCOPY (EGD) WITH PROPOFOL with balloon dilation;  Surgeon: Midge Minium, MD;  Location: [facility];  Service: Endoscopy;  Laterality: Bilateral;   INGUINAL HERNIA REPAIR  child   bilateral   JOINT REPLACEMENT Right    hip   LEFT HEART CATH AND CORONARY ANGIOGRAPHY Left 12/23/2021   Procedure: LEFT HEART CATH AND CORONARY ANGIOGRAPHY;  Surgeon: Marcina Millard, MD;  Location: [facility] INVASIVE CV LAB;  Service: Cardiovascular;  Laterality: Left;   MOHS SURGERY Left nose   at [facility]--- 2013   prolactinoma excision  06/2003   [facility]   right testicle removed  1978   SHOULDER OPEN ROTATOR CUFF REPAIR Left 03/2016   and biceps repair--[facility]   stress nuclear  negative   10/09   TONSILLECTOMY      MEDICATIONS:  albuterol  (VENTOLIN HFA) 108 (90 Base) MCG/ACT inhaler   ALPRAZolam (XANAX) 0.5 MG tablet   Brimonidine Tartrate (LUMIFY) 0.025 % SOLN   buPROPion (WELLBUTRIN XL) 300 MG 24 hr tablet   GEMTESA 75 MG TABS   hydrocortisone (ANUSOL-HC) 25 MG suppository   metoprolol succinate (TOPROL-XL) 50 MG 24 hr tablet   omeprazole (PRILOSEC) 40 MG capsule   promethazine-dextromethorphan (PROMETHAZINE-DM) 6.25-15 MG/5ML syrup   pseudoephedrine (SUDAFED) 120 MG 12 hr tablet   rosuvastatin (CRESTOR) 5 MG tablet   Semaglutide,0.25 or 0.5MG /DOS, 2 MG/3ML SOPN   silodosin (RAPAFLO) 8 MG CAPS capsule   tadalafil (CIALIS) 5 MG tablet   valsartan-hydrochlorothiazide (DIOVAN-HCT) 320-12.5 MG tablet   No current facility-administered medications for this encounter.   Marcille Blanco [facility] Short Stay/Anesthesiology [facility] Phone [phone] 04/15/2023 11:17 AM

## 2023-04-22 ENCOUNTER — Encounter (HOSPITAL_COMMUNITY)
Admission: RE | Disposition: A | Discharge status: Home / Self Care | Payer: Self-pay | Source: Home / Self Care | Attending: Urology

## 2023-04-22 ENCOUNTER — Ambulatory Visit (HOSPITAL_COMMUNITY): Payer: Medicare Other | Admitting: Medical

## 2023-04-22 ENCOUNTER — Ambulatory Visit (HOSPITAL_COMMUNITY)
Admission: RE | Admit: 2023-04-22 | Discharge: 2023-04-22 | Disposition: A | Discharge status: Home / Self Care | Payer: Medicare Other | Attending: Urology | Admitting: Urology

## 2023-04-22 ENCOUNTER — Ambulatory Visit (HOSPITAL_COMMUNITY): Payer: Medicare Other

## 2023-04-22 ENCOUNTER — Encounter (HOSPITAL_COMMUNITY): Payer: Self-pay | Admitting: Urology

## 2023-04-22 ENCOUNTER — Ambulatory Visit (HOSPITAL_COMMUNITY): Payer: Medicare Other | Admitting: Physician Assistant

## 2023-04-22 DIAGNOSIS — F419 Anxiety disorder, unspecified: Secondary | ICD-10-CM | POA: Insufficient documentation

## 2023-04-22 DIAGNOSIS — I129 Hypertensive chronic kidney disease with stage 1 through stage 4 chronic kidney disease, or unspecified chronic kidney disease: Secondary | ICD-10-CM | POA: Diagnosis not present

## 2023-04-22 DIAGNOSIS — Z8547 Personal history of malignant neoplasm of testis: Secondary | ICD-10-CM | POA: Insufficient documentation

## 2023-04-22 DIAGNOSIS — K219 Gastro-esophageal reflux disease without esophagitis: Secondary | ICD-10-CM | POA: Diagnosis not present

## 2023-04-22 DIAGNOSIS — N189 Chronic kidney disease, unspecified: Secondary | ICD-10-CM | POA: Diagnosis not present

## 2023-04-22 DIAGNOSIS — D414 Neoplasm of uncertain behavior of bladder: Secondary | ICD-10-CM | POA: Diagnosis present

## 2023-04-22 DIAGNOSIS — I10 Essential (primary) hypertension: Secondary | ICD-10-CM | POA: Diagnosis not present

## 2023-04-22 DIAGNOSIS — F32A Depression, unspecified: Secondary | ICD-10-CM | POA: Insufficient documentation

## 2023-04-22 DIAGNOSIS — I2510 Atherosclerotic heart disease of native coronary artery without angina pectoris: Secondary | ICD-10-CM | POA: Diagnosis not present

## 2023-04-22 DIAGNOSIS — D090 Carcinoma in situ of bladder: Secondary | ICD-10-CM | POA: Insufficient documentation

## 2023-04-22 DIAGNOSIS — Z87891 Personal history of nicotine dependence: Secondary | ICD-10-CM | POA: Insufficient documentation

## 2023-04-22 DIAGNOSIS — I739 Peripheral vascular disease, unspecified: Secondary | ICD-10-CM

## 2023-04-22 DIAGNOSIS — N3289 Other specified disorders of bladder: Secondary | ICD-10-CM

## 2023-04-22 DIAGNOSIS — Z9221 Personal history of antineoplastic chemotherapy: Secondary | ICD-10-CM | POA: Insufficient documentation

## 2023-04-22 HISTORY — PX: CYSTOSCOPY WITH BIOPSY: SHX5122

## 2023-04-22 HISTORY — PX: CYSTOSCOPY W/ RETROGRADES: SHX1426

## 2023-04-22 LAB — BASIC METABOLIC PANEL
Anion gap: 7 (ref 5–15)
BUN: 18 mg/dL (ref 8–23)
CO2: 26 mmol/L (ref 22–32)
Calcium: 8.5 mg/dL — ABNORMAL LOW (ref 8.9–10.3)
Chloride: 105 mmol/L (ref 98–111)
Creatinine, Ser: 1.25 mg/dL — ABNORMAL HIGH (ref 0.61–1.24)
GFR, Estimated: >60 mL/min (ref >60)
Glucose, Bld: 101 mg/dL — ABNORMAL HIGH (ref 70–99)
Potassium: 4 mmol/L (ref 3.5–5.1)
Sodium: 138 mmol/L (ref 135–145)

## 2023-04-22 LAB — GLUCOSE, CAPILLARY: Glucose-Capillary: 102 mg/dL — ABNORMAL HIGH (ref 70–99)

## 2023-04-22 LAB — CBC
HCT: 44.5 % (ref 39.0–52.0)
Hemoglobin: 15.3 g/dL (ref 13.0–17.0)
MCH: 31.9 pg (ref 26.0–34.0)
MCHC: 34.4 g/dL (ref 30.0–36.0)
MCV: 92.9 fL (ref 80.0–100.0)
Platelets: 179 10*3/uL (ref 150–400)
RBC: 4.79 MIL/uL (ref 4.22–5.81)
RDW: 12.2 % (ref 11.5–15.5)
WBC: 4.5 10*3/uL (ref 4.0–10.5)
nRBC: 0 % (ref 0.0–0.2)

## 2023-04-22 SURGERY — CYSTOSCOPY, WITH BIOPSY
Anesthesia: General

## 2023-04-22 MED ORDER — LACTATED RINGERS IV SOLN: INTRAVENOUS | Status: DC

## 2023-04-22 MED ORDER — CEFAZOLIN SODIUM-DEXTROSE 2-4 GM/100ML-% IV SOLN
2.0000 g | INTRAVENOUS | Status: AC
  Administered 2023-04-22: 2 g via INTRAVENOUS
  Filled 2023-04-22: qty 100

## 2023-04-22 MED ORDER — FENTANYL CITRATE PF 50 MCG/ML IJ SOSY
25.0000 ug | PREFILLED_SYRINGE | INTRAMUSCULAR | Status: DC | PRN
  Administered 2023-04-22: 25 ug via INTRAVENOUS
  Administered 2023-04-22: 50 ug via INTRAVENOUS

## 2023-04-22 MED ORDER — FENTANYL CITRATE PF 50 MCG/ML IJ SOSY
PREFILLED_SYRINGE | INTRAMUSCULAR | Status: AC
  Administered 2023-04-22: 25 ug via INTRAVENOUS
  Filled 2023-04-22: qty 2

## 2023-04-22 MED ORDER — DEXAMETHASONE SODIUM PHOSPHATE 10 MG/ML IJ SOLN
INTRAMUSCULAR | Status: DC | PRN
  Administered 2023-04-22: 4 mg via INTRAVENOUS

## 2023-04-22 MED ORDER — CHLORHEXIDINE GLUCONATE 0.12 % MT SOLN
15.0000 mL | Freq: Once | OROMUCOSAL | Status: AC
  Administered 2023-04-22: 15 mL via OROMUCOSAL

## 2023-04-22 MED ORDER — FENTANYL CITRATE (PF) 100 MCG/2ML IJ SOLN
INTRAMUSCULAR | Status: AC
  Filled 2023-04-22: qty 2

## 2023-04-22 MED ORDER — DEXAMETHASONE SODIUM PHOSPHATE 10 MG/ML IJ SOLN
INTRAMUSCULAR | Status: AC
  Filled 2023-04-22: qty 1

## 2023-04-22 MED ORDER — SUGAMMADEX SODIUM 200 MG/2ML IV SOLN
INTRAVENOUS | Status: DC | PRN
  Administered 2023-04-22: 200 mg via INTRAVENOUS

## 2023-04-22 MED ORDER — CHLORHEXIDINE GLUCONATE 0.12 % MT SOLN: 15.0000 mL | Freq: Once | OROMUCOSAL | Status: AC

## 2023-04-22 MED ORDER — FENTANYL CITRATE PF 50 MCG/ML IJ SOSY
PREFILLED_SYRINGE | INTRAMUSCULAR | Status: AC
  Administered 2023-04-22: 50 ug via INTRAVENOUS
  Filled 2023-04-22: qty 1

## 2023-04-22 MED ORDER — ROCURONIUM BROMIDE 10 MG/ML (PF) SYRINGE
PREFILLED_SYRINGE | INTRAVENOUS | Status: DC | PRN
  Administered 2023-04-22: 50 mg via INTRAVENOUS

## 2023-04-22 MED ORDER — ONDANSETRON HCL 4 MG/2ML IJ SOLN
INTRAMUSCULAR | Status: AC
  Filled 2023-04-22: qty 2

## 2023-04-22 MED ORDER — OXYCODONE HCL 5 MG PO TABS
5.0000 mg | ORAL_TABLET | Freq: Once | ORAL | Status: AC | PRN
  Administered 2023-04-22: 5 mg via ORAL

## 2023-04-22 MED ORDER — ORAL CARE MOUTH RINSE: 15.0000 mL | Freq: Once | OROMUCOSAL | Status: AC

## 2023-04-22 MED ORDER — OXYCODONE HCL 5 MG PO TABS
ORAL_TABLET | ORAL | Status: AC
  Filled 2023-04-22: qty 1

## 2023-04-22 MED ORDER — FENTANYL CITRATE (PF) 250 MCG/5ML IJ SOLN
INTRAMUSCULAR | Status: DC | PRN
  Administered 2023-04-22: 100 ug via INTRAVENOUS

## 2023-04-22 MED ORDER — 0.9 % SODIUM CHLORIDE (POUR BTL) OPTIME
TOPICAL | Status: DC | PRN
  Administered 2023-04-22: 1000 mL

## 2023-04-22 MED ORDER — ARTIFICIAL TEARS OPHTHALMIC OINT
TOPICAL_OINTMENT | OPHTHALMIC | Status: AC
  Filled 2023-04-22: qty 3.5

## 2023-04-22 MED ORDER — ONDANSETRON HCL 4 MG/2ML IJ SOLN
INTRAMUSCULAR | Status: DC | PRN
  Administered 2023-04-22: 4 mg via INTRAVENOUS

## 2023-04-22 MED ORDER — IOHEXOL 300 MG/ML  SOLN
INTRAMUSCULAR | Status: DC | PRN
  Administered 2023-04-22: 15 mL

## 2023-04-22 MED ORDER — SODIUM CHLORIDE 0.9 % IR SOLN
Status: DC | PRN
  Administered 2023-04-22: 9000 mL

## 2023-04-22 MED ORDER — ONDANSETRON HCL 4 MG/2ML IJ SOLN: 4.0000 mg | Freq: Four times a day (QID) | INTRAMUSCULAR | Status: DC | PRN

## 2023-04-22 MED ORDER — PROPOFOL 10 MG/ML IV BOLUS
INTRAVENOUS | Status: AC
  Filled 2023-04-22: qty 20

## 2023-04-22 MED ORDER — OXYCODONE HCL 5 MG/5ML PO SOLN: 5.0000 mg | Freq: Once | ORAL | Status: AC | PRN

## 2023-04-22 MED ORDER — LIDOCAINE HCL (CARDIAC) PF 100 MG/5ML IV SOSY
PREFILLED_SYRINGE | INTRAVENOUS | Status: DC | PRN
  Administered 2023-04-22: 60 mg via INTRAVENOUS

## 2023-04-22 MED ORDER — PROPOFOL 10 MG/ML IV BOLUS
INTRAVENOUS | Status: DC | PRN
Start: 2023-04-22 — End: 2023-04-22
  Administered 2023-04-22: 170 mg via INTRAVENOUS

## 2023-04-22 MED ORDER — PHENYLEPHRINE 80 MCG/ML (10ML) SYRINGE FOR IV PUSH (FOR BLOOD PRESSURE SUPPORT)
PREFILLED_SYRINGE | INTRAVENOUS | Status: DC | PRN
  Administered 2023-04-22: 160 ug via INTRAVENOUS

## 2023-04-22 MED ORDER — LIDOCAINE HCL (PF) 2 % IJ SOLN
INTRAMUSCULAR | Status: AC
  Filled 2023-04-22: qty 5

## 2023-04-22 SURGICAL SUPPLY — 23 items
BAG DRN RND TRDRP ANRFLXCHMBR (UROLOGICAL SUPPLIES) ×2
BAG URINE DRAIN 2000ML AR STRL (UROLOGICAL SUPPLIES) IMPLANT
BAG URO CATCHER STRL LF (MISCELLANEOUS) ×2 IMPLANT
CATH FOLEY 2WAY SLVR 5CC 18FR (CATHETERS) IMPLANT
CATH URETL OPEN END 6FR 70 (CATHETERS) IMPLANT
CLOTH BEACON ORANGE TIMEOUT ST (SAFETY) ×2 IMPLANT
DRAPE FOOT SWITCH (DRAPES) ×2 IMPLANT
GLOVE SURG LX STRL 7.5 STRW (GLOVE) ×2 IMPLANT
GOWN STRL REUS W/ TWL XL LVL3 (GOWN DISPOSABLE) ×2 IMPLANT
GOWN STRL REUS W/TWL XL LVL3 (GOWN DISPOSABLE) ×2
GUIDEWIRE STR DUAL SENSOR (WIRE) ×2 IMPLANT
KIT TURNOVER KIT A (KITS) IMPLANT
LASER FIB FLEXIVA PULSE ID 365 (Laser) IMPLANT
LOOP CUT BIPOLAR 24F LRG (ELECTROSURGICAL) IMPLANT
MANIFOLD NEPTUNE II (INSTRUMENTS) ×2 IMPLANT
NS IRRIG 1000ML POUR BTL (IV SOLUTION) IMPLANT
PACK CYSTO (CUSTOM PROCEDURE TRAY) ×2 IMPLANT
SYR TOOMEY IRRIG 70ML (MISCELLANEOUS) ×2
SYRINGE TOOMEY IRRIG 70ML (MISCELLANEOUS) IMPLANT
TRACTIP FLEXIVA PULS ID 200XHI (Laser) IMPLANT
TRACTIP FLEXIVA PULSE ID 200 (Laser)
TUBING CONNECTING 10 (TUBING) ×2 IMPLANT
TUBING UROLOGY SET (TUBING) ×2 IMPLANT

## 2023-04-22 NOTE — Op Note (Addendum)
Preoperative diagnosis: Bladder neoplasm uncertain malignant potential Postoperative diagnosis: Same  Procedure: Cystoscopy with bilateral retrograde pyelogram; TURBT 2 to 5 cm  Surgeon: Mena Goes  Anesthesia: General  Indication for procedure: Chips a 67 year old male who was being evaluated for urinary frequency and urgency..  Cystoscopy revealed cobblestone and erythematous mucosa right bladder.  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  He was brought for the above procedure.  Findings: On exam under anesthesia the penis was circumcised without mass or lesion.  Glans and meatus appeared normal.  Right testicle surgically absent.  Left testicle palpably normal and descended.  On DRE prostate 30 g and smooth without hard area or nodule.  No bladder or pelvic mass palpable on bimanual.  On cystoscopy the urethra was unremarkable, prostatic urethra with mild hyperplasia, no obstruction.  Trigone and ureteral orifice ease were in their normal orthotopic position with clear E flux.  There was some mild diffuse posterior bladder wall erythema or petechiae more like glomerulations.  These were biopsied.  More suspicious there was a cobblestone almost-papillary appearing plaque right anterior which was about 2.5 cm right bladder wall.  This was resected.  Left retrograde pyelogram-this outlined a single ureter single collecting system unit without filling defect, stricture or dilation.  Right retrograde pyelogram-this outlined a single ureter single collecting system unit without filling defect, stricture or dilation.  Description of procedure: After consent was obtained the patient was brought to the operating room.  After adequate anesthesia he was placed in lithotomy position.  Exam under anesthesia was performed.  He was prepped and draped in the usual sterile fashion.  Timeout was performed to confirm the patient and procedure.  Cystoscope was passed per urethra to the bladder  carefully inspected with a 30 degree and 70 degree lens.  The left ureteral orifice was cannulated with a 5 [place] open-ended catheter and left retrograde injection of contrast was performed.  Similar procedure on the right side.  I then took the continuous-flow sheath passed that with the visual obturator into the bladder and swapped that out for the loop and handle.  I then switched to the cold cup and biopsied the right posterior and posterior bladder.  These were more glomerulation type findings.  The biopsies were fulgurated with the loop.  I then resected the flat cobblestone like mucosa right anterior for about 2.5 - 3 cm total. The chips were obtained and sent to pathology.  Hemostasis was excellent at the resection and biopsy sites at low pressure.  I was not concerned about perforation.  The scope was backed out an 1 [place] Foley was placed left to gravity drainage.  Complications: None  Blood loss: Minimal  Specimens to pathology: #1 right posterior bladder biopsy #2 posterior bladder biopsy #3 right anterior resection  Drains: 18 French Foley catheter  Disposition: Patient stable to PACU

## 2023-04-22 NOTE — Transfer of Care (Signed)
Immediate Anesthesia Transfer of Care Note  Patient: Dustin [name]  Procedure(s) Performed: CYSTOSCOPY WITH BIOPSY/ TRANSURETHRAL RESECTION OF BLADDER TUMOR 2-5cm CYSTOSCOPY WITH BILATERAL RETROGRADE PYELOGRAM (Bilateral)  Patient Location: PACU  Anesthesia Type:General  Level of Consciousness: awake and patient cooperative  Airway & Oxygen Therapy: Patient Spontanous Breathing and Patient connected to nasal cannula oxygen  Post-op Assessment: Report given to RN and Post -op Vital signs reviewed and stable  Post vital signs: Reviewed and stable  Last Vitals:  Vitals Value Taken Time  BP 188/103 04/22/23 1241  Temp    Pulse 72 04/22/23 1243  Resp 15 04/22/23 1243  SpO2 100 % 04/22/23 1243  Vitals shown include unfiled device data.  Last Pain:  Vitals:   04/22/23 0950  TempSrc:   PainSc: 0-No pain         Complications: No notable events documented.

## 2023-04-22 NOTE — H&P (Signed)
H&P  Chief Complaint: Bladder neoplasm uncertain malignant potential  History of Present Illness: [name] is a 67 year old male with a history of frequency and urgency.  He was undergoing evaluation and cystoscopy revealed right bladder erythema with a cobblestoned patch of mucosa.  His cytology was suspicious for high-grade urothelial carcinoma.  He was brought today for cystoscopy, bilateral retrograde pyelograms, bladder biopsy versus TURBT.  His procedure was delayed due to bronchitis which she has recovered from.  He denies any cough, shortness of breath or chest pain.  He has some sinus drainage which is chronic.  No fever.  No dysuria or gross hematuria.  His urinary symptoms have somewhat improved after cessation of silodosin.  He has a history of testicular cancer and RPLND and chemotherapy.  He is a Futures trader.  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   Past Surgical History:  Procedure Laterality Date   ABDOMINAL EXPLORATION SURGERY  1978   APPENDECTOMY     at same time as lymph node dissection   CARPAL TUNNEL RELEASE Right    COLONOSCOPY WITH PROPOFOL N/A 05/13/2022   Procedure: COLONOSCOPY WITH PROPOFOL;  Surgeon: Midge Minium, MD;  Location: [facility];  Service: Endoscopy;  Laterality: N/A;   ESOPHAGOGASTRODUODENOSCOPY  12/2006   and colon   ESOPHAGOGASTRODUODENOSCOPY (EGD) WITH PROPOFOL Bilateral 05/13/2022   Procedure: ESOPHAGOGASTRODUODENOSCOPY (EGD) WITH PROPOFOL with balloon dilation;  Surgeon: Midge Minium, MD;  Location: [facility];   Service: Endoscopy;  Laterality: Bilateral;   INGUINAL HERNIA REPAIR  child   bilateral   JOINT REPLACEMENT Right    hip   LEFT HEART CATH AND CORONARY ANGIOGRAPHY Left 12/23/2021   Procedure: LEFT HEART CATH AND CORONARY ANGIOGRAPHY;  Surgeon: Marcina Millard, MD;  Location: [facility] INVASIVE CV LAB;  Service: Cardiovascular;  Laterality: Left;   MOHS SURGERY Left nose   at [facility]--- 2013   prolactinoma excision  06/2003   [facility]   right testicle removed  1978   SHOULDER OPEN ROTATOR CUFF REPAIR Left 03/2016   and biceps repair--[facility]   stress nuclear  negative   10/09   TONSILLECTOMY      Home Medications:  Medications Prior to Admission  Medication Sig Dispense Refill Last Dose   albuterol (VENTOLIN HFA) 108 (90 Base) MCG/ACT inhaler Inhale 2 puffs into the lungs every 4 (four) hours as needed for wheezing or shortness of breath.   Past Week   Brimonidine Tartrate (LUMIFY) 0.025 % SOLN Place 1 drop into both eyes in the morning and at bedtime.   04/21/2023   buPROPion (WELLBUTRIN XL) 300 MG 24 hr tablet Take 1 tablet (300 mg total) by mouth daily. 90 tablet 3 04/22/2023 at 0800   GEMTESA 75 MG TABS TAKE 1 TABLET BY MOUTH DAILY (Patient taking differently: Take 1 tablet by mouth every other day.) 30 tablet 2 04/21/2023   metoprolol succinate (TOPROL-XL) 50 MG 24 hr tablet Take 50 mg by mouth in the morning.   04/22/2023  at 0800   omeprazole (PRILOSEC) 40 MG capsule TAKE ONE CAPSULE BY MOUTH TWICE A DAY (Patient taking differently: Take 40 mg by mouth daily before breakfast.) 180 capsule 1 04/22/2023 at 0800   promethazine-dextromethorphan (PROMETHAZINE-DM) 6.25-15 MG/5ML syrup Take 5 mLs by mouth every 6 (six) hours as needed for cough.   Past Month   pseudoephedrine (SUDAFED) 120 MG 12 hr tablet Take 120 mg by mouth every 12 (twelve) hours as needed for congestion.   Past Week   rosuvastatin (CRESTOR) 5 MG tablet Take 5 mg by mouth every other day. In the morning.   04/21/2023    Semaglutide,0.25 or 0.5MG /DOS, 2 MG/3ML SOPN Inject 0.25 mg into the skin once a week.      tadalafil (CIALIS) 5 MG tablet Take 5 mg by mouth in the morning.   04/21/2023   valsartan-hydrochlorothiazide (DIOVAN-HCT) 320-12.5 MG tablet Take 1 tablet by mouth every evening.   04/20/2023   ALPRAZolam (XANAX) 0.5 MG tablet TAKE ONE TABLET BY MOUTH EVERY NIGHT AT BEDTIME AS NEEDED FOR ANXIETY 14 tablet 0 More than a month   hydrocortisone (ANUSOL-HC) 25 MG suppository Place 1 suppository (25 mg total) rectally every 12 (twelve) hours. (Patient not taking: Reported on 04/15/2023) 12 suppository 1 Not Taking   silodosin (RAPAFLO) 8 MG CAPS capsule TAKE 1 CAPSULE BY MOUTH DAILY WITH BREAKFAST (Patient not taking: Reported on 04/15/2023) 90 capsule 0 Not Taking   Allergies:  Allergies  Allergen Reactions   Amlodipine Swelling    LE EDEMA   Sulfa Antibiotics     Low BP   Varenicline Tartrate     Irritability    Family History  Problem Relation Age of Onset   AAA (abdominal aortic aneurysm) Father    Brain cancer Father    Cancer Paternal Aunt        lung   Cancer Other        strong in family   Cancer Brother        melanoma   Cerebral aneurysm Paternal Grandfather    Osteoporosis Mother    Social History:  reports that he quit smoking about 10 years ago. His smoking use included cigarettes. He started smoking about 51 years ago. He has a 60 pack-year smoking history. He has never used smokeless tobacco. He reports current alcohol use. He reports that he does not currently use drugs after having used the following drugs: Marijuana.  ROS: A complete review of systems was performed.  All systems are negative except for pertinent findings as noted. Review of Systems  All other systems reviewed and are negative.    Physical Exam:  Vital signs in last 24 hours: Temp:  [98.1 F (36.7 C)] 98.1 F (36.7 C) (07/12 0929) BP: (177)/(95) 177/95 (07/12 0929) General:  Alert and oriented, No acute  distress HEENT: Normocephalic, atraumatic Cardiovascular: Regular rate and rhythm Lungs: Regular rate and effort Abdomen: Soft, nontender, nondistended, no abdominal masses Back: No CVA tenderness Extremities: No edema Neurologic: Grossly intact  Laboratory Data:  Results for orders placed or performed during the hospital encounter of 04/22/23 (from the past 24 hour(s))  Glucose, capillary     Status: Abnormal   Collection Time: 04/22/23  9:29 AM  Result Value Ref Range   Glucose-Capillary 102 (H) 70 - 99 mg/dL   No results found for this or any previous visit (from the past 240 hour(s)). Creatinine: No results for input(s): "CREATININE" in the last 168 hours.  Impression/Assessment:  Bladder neoplasm uncertain  malignant potential and patient with frequency, urgency and positive urine cytology-  Plan:  I discussed with the patient the nature, potential benefits, risks and alternatives to cystoscopy, bilateral retrograde pyelogram, bladder biopsy and fulguration versus TURBT, including side effects of the proposed treatment, the likelihood of the patient achieving the goals of the procedure, and any potential problems that might occur during the procedure or recuperation.  We went over the differences in technique of biopsy versus fulguration compared to resection.  We discussed he may need a Foley catheter postoperatively.  All questions answered. Patient elects to proceed.   Jerilee Field 04/22/2023, 9:37 AM

## 2023-04-22 NOTE — Discharge Instructions (Signed)
Removal of the Foley: Remove the Foley on Monday morning, April 24, 2023 as instructed

## 2023-04-25 ENCOUNTER — Encounter (HOSPITAL_COMMUNITY): Payer: Self-pay | Admitting: Urology

## 2023-04-25 LAB — SURGICAL PATHOLOGY

## 2023-04-25 NOTE — Anesthesia Postprocedure Evaluation (Signed)
Anesthesia Post Note  Patient: Dustin [name]  Procedure(s) Performed: CYSTOSCOPY WITH BIOPSY/ TRANSURETHRAL RESECTION OF BLADDER TUMOR 2-5cm CYSTOSCOPY WITH BILATERAL RETROGRADE PYELOGRAM (Bilateral)     Patient location during evaluation: PACU Anesthesia Type: General Level of consciousness: awake and alert Pain management: pain level controlled Vital Signs Assessment: post-procedure vital signs reviewed and stable Respiratory status: spontaneous breathing, nonlabored ventilation, respiratory function stable and patient connected to nasal cannula oxygen Cardiovascular status: blood pressure returned to baseline and stable Postop Assessment: no apparent nausea or vomiting Anesthetic complications: no   No notable events documented.  Last Vitals:  Vitals:   04/22/23 1345 04/22/23 1400  BP: (!) 155/95 (!) 168/93  Pulse: (!) 57 70  Resp: 10 12  Temp:  36.7 C  SpO2: 95% 92%    Last Pain:  Vitals:   04/22/23 1400  TempSrc:   PainSc: 0-No pain                 Quince Santana S

## 2023-09-12 ENCOUNTER — Ambulatory Visit: Admission: RE | Admit: 2023-09-12 | Payer: Medicare Other | Source: Ambulatory Visit

## 2023-12-25 NOTE — Progress Notes (Deleted)
 MRN : [id number]  Dustin [name] is a 68 y.o. (05-16-1956) male who presents with chief complaint of check circulation.  History of Present Illness:   The patient presents to the office for evaluation of an ascending thoracic aortic aneurysm. The aneurysm was found incidentally by CT scan. Patient denies chest pain or unusual back pain, no other chest or abdominal complaints.  No history of an abrupt onset of a painful toe associated with blue discoloration.      No family history of TAA/AAA.    Patient denies amaurosis fugax or TIA symptoms.  There is no history of claudication or rest pain symptoms of the lower extremities.   The patient denies angina or shortness of breath.   CT scan dated 12/07/2022, shows an ascending TAA that measures 4.5 cm   No outpatient medications have been marked as taking for the 12/26/23 encounter (Appointment) with Gilda Crease, Latina Craver, MD.    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    Past Surgical History:  Procedure Laterality Date   ABDOMINAL EXPLORATION SURGERY  1978   APPENDECTOMY     at same time as lymph node dissection   CARPAL TUNNEL RELEASE Right    COLONOSCOPY WITH PROPOFOL N/A 05/13/2022   Procedure: COLONOSCOPY WITH PROPOFOL;  Surgeon: Midge Minium, MD;  Location: [facility];  Service: Endoscopy;  Laterality: N/A;   CYSTOSCOPY W/ RETROGRADES Bilateral 04/22/2023   Procedure: CYSTOSCOPY WITH BILATERAL RETROGRADE PYELOGRAM;  Surgeon: Jerilee Field,  MD;  Location: [facility];  Service: Urology;  Laterality: Bilateral;  60 MINS FOR CASES   CYSTOSCOPY WITH BIOPSY N/A 04/22/2023   Procedure: CYSTOSCOPY WITH BIOPSY/ TRANSURETHRAL RESECTION OF BLADDER TUMOR 2-5cm;  Surgeon: Jerilee Field, MD;  Location: [facility];  Service: Urology;  Laterality: N/A;   ESOPHAGOGASTRODUODENOSCOPY  12/2006   and colon   ESOPHAGOGASTRODUODENOSCOPY (EGD) WITH PROPOFOL Bilateral 05/13/2022   Procedure: ESOPHAGOGASTRODUODENOSCOPY (EGD) WITH PROPOFOL with balloon dilation;  Surgeon: Midge Minium, MD;  Location: [facility];  Service: Endoscopy;  Laterality: Bilateral;   INGUINAL HERNIA REPAIR  child   bilateral   JOINT REPLACEMENT Right    hip   LEFT HEART CATH AND CORONARY ANGIOGRAPHY Left 12/23/2021   Procedure: LEFT HEART CATH AND CORONARY ANGIOGRAPHY;  Surgeon: Marcina Millard, MD;  Location: [facility] INVASIVE CV LAB;  Service: Cardiovascular;  Laterality: Left;   MOHS SURGERY Left nose   at [facility]--- 2013   prolactinoma excision  06/2003   [facility]   right testicle removed  1978   SHOULDER OPEN ROTATOR CUFF REPAIR Left 03/2016  and biceps repair--[facility]   stress nuclear  negative   10/09   TONSILLECTOMY      Social History Social History   Tobacco Use   Smoking status: Former    Current packs/day: 0.00    Average packs/day: 1.5 packs/day for 40.0 years (60.0 ttl pk-yrs)    Types: Cigarettes    Start date: 04/25/1972    Quit date: 04/25/2012    Years since quitting: 11.6   Smokeless tobacco: Never  Vaping Use   Vaping status: Never Used  Substance Use Topics   Alcohol use: Yes    Comment: occasional   Drug use: Not Currently    Types: Marijuana    Comment: When BP is elevated. maybe twice a month    Family History Family History  Problem Relation Age of Onset   AAA (abdominal aortic aneurysm) Father    Brain cancer Father    Cancer Paternal Aunt        lung   Cancer Other        strong in family   Cancer Brother         melanoma   Cerebral aneurysm Paternal Grandfather    Osteoporosis Mother     Allergies  Allergen Reactions   Amlodipine Swelling    LE EDEMA   Sulfa Antibiotics     Low BP   Varenicline Tartrate     Irritability     REVIEW OF SYSTEMS (Negative unless checked)  Constitutional: [] Weight loss  [] Fever  [] Chills Cardiac: [] Chest pain   [] Chest pressure   [] Palpitations   [] Shortness of breath when laying flat   [] Shortness of breath with exertion. Vascular:  [x] Pain in legs with walking   [] Pain in legs at rest  [] History of DVT   [] Phlebitis   [] Swelling in legs   [] Varicose veins   [] Non-healing ulcers Pulmonary:   [] Uses home oxygen   [] Productive cough   [] Hemoptysis   [] Wheeze  [] COPD   [] Asthma Neurologic:  [] Dizziness   [] Seizures   [] History of stroke   [] History of TIA  [] Aphasia   [] Vissual changes   [] Weakness or numbness in arm   [] Weakness or numbness in leg Musculoskeletal:   [] Joint swelling   [] Joint pain   [] Low back pain Hematologic:  [] Easy bruising  [] Easy bleeding   [] Hypercoagulable state   [] Anemic Gastrointestinal:  [] Diarrhea   [] Vomiting  [] Gastroesophageal reflux/heartburn   [] Difficulty swallowing. Genitourinary:  [] Chronic kidney disease   [] Difficult urination  [] Frequent urination   [] Blood in urine Skin:  [] Rashes   [] Ulcers  Psychological:  [] History of anxiety   []  History of major depression.  Physical Examination  There were no vitals filed for this visit. There is no height or weight on file to calculate BMI. Gen: WD/WN, NAD Head: [place]/AT, No temporalis wasting.  Ear/Nose/Throat: Hearing grossly intact, nares w/o erythema or drainage Eyes: PER, EOMI, sclera nonicteric.  Neck: Supple, no masses.  No bruit or JVD.  Pulmonary:  Good air movement, no audible wheezing, no use of accessory muscles.  Cardiac: RRR, normal S1, S2, no Murmurs. Vascular:  mild trophic changes, no open wounds Vessel Right Left  Radial Palpable Palpable  PT Not Palpable  Not Palpable  DP Not Palpable Not Palpable  Gastrointestinal: soft, non-distended. No guarding/no peritoneal signs.  Musculoskeletal: M/S 5/5 throughout.  No visible deformity.  Neurologic: CN 2-12 intact. Pain and light touch intact in extremities.  Symmetrical.  Speech is fluent. Motor exam as listed above. Psychiatric: Judgment  intact, Mood & affect appropriate for pt's clinical situation. Dermatologic: No rashes or ulcers noted.  No changes consistent with cellulitis.   CBC Lab Results  Component Value Date   WBC 4.5 04/22/2023   HGB 15.3 04/22/2023   HCT 44.5 04/22/2023   MCV 92.9 04/22/2023   PLT 179 04/22/2023    BMET    Component Value Date/Time   NA 138 04/22/2023 0930   NA 141 04/19/2022 1044   NA 138 07/18/2014 1018   K 4.0 04/22/2023 0930   K 4.4 07/18/2014 1018   CL 105 04/22/2023 0930   CL 107 07/18/2014 1018   CO2 26 04/22/2023 0930   CO2 24 07/18/2014 1018   GLUCOSE 101 (H) 04/22/2023 0930   GLUCOSE 93 07/18/2014 1018   BUN 18 04/22/2023 0930   BUN 17 04/19/2022 1044   BUN 14 07/18/2014 1018   CREATININE 1.25 (H) 04/22/2023 0930   CREATININE 1.05 07/18/2014 1018   CALCIUM 8.5 (L) 04/22/2023 0930   CALCIUM 8.5 07/18/2014 1018   GFRNONAA >60 04/22/2023 0930   GFRNONAA >60 07/18/2014 1018   GFRNONAA >60 08/25/2012 0836   GFRAA 61 04/22/2020 0921   GFRAA >60 07/18/2014 1018   GFRAA >60 08/25/2012 0836   CrCl cannot be calculated (Patient's most recent lab result is older than the maximum 21 days allowed.).  COAG No results found for: "INR", "PROTIME"  Radiology No results found.   Assessment/Plan There are no diagnoses linked to this encounter.   Levora Dredge, MD  12/25/2023 11:25 AM

## 2023-12-26 ENCOUNTER — Ambulatory Visit (INDEPENDENT_AMBULATORY_CARE_PROVIDER_SITE_OTHER): Payer: Medicare Other | Admitting: Vascular Surgery

## 2023-12-26 ENCOUNTER — Ambulatory Visit (INDEPENDENT_AMBULATORY_CARE_PROVIDER_SITE_OTHER): Admitting: Vascular Surgery

## 2023-12-26 ENCOUNTER — Encounter (INDEPENDENT_AMBULATORY_CARE_PROVIDER_SITE_OTHER): Payer: Self-pay | Admitting: Vascular Surgery

## 2023-12-26 VITALS — BP 132/78 | HR 74 | Resp 16 | Wt 222.6 lb

## 2023-12-26 DIAGNOSIS — I739 Peripheral vascular disease, unspecified: Secondary | ICD-10-CM

## 2023-12-26 DIAGNOSIS — I2510 Atherosclerotic heart disease of native coronary artery without angina pectoris: Secondary | ICD-10-CM

## 2023-12-26 DIAGNOSIS — I723 Aneurysm of iliac artery: Secondary | ICD-10-CM

## 2023-12-26 DIAGNOSIS — I7121 Aneurysm of the ascending aorta, without rupture: Secondary | ICD-10-CM

## 2023-12-26 DIAGNOSIS — I872 Venous insufficiency (chronic) (peripheral): Secondary | ICD-10-CM | POA: Diagnosis not present

## 2023-12-26 DIAGNOSIS — I10 Essential (primary) hypertension: Secondary | ICD-10-CM

## 2023-12-26 NOTE — Progress Notes (Signed)
 MRN : [id number]  Dustin [name] is a 68 y.o. (1956-04-24) male who presents with chief complaint of check circulation.  History of Present Illness:   The patient presents to the office for evaluation of an ascending thoracic aortic aneurysm. The aneurysm was found incidentally by CT scan. Patient denies chest pain or unusual back pain, no other chest or abdominal complaints.  No history of an abrupt onset of a painful toe associated with blue discoloration.      No family history of TAA/AAA.    Patient denies amaurosis fugax or TIA symptoms.  There is no history of claudication or rest pain symptoms of the lower extremities.   The patient denies angina or shortness of breath.   Previous CT scan dated 12/07/2022, shows an ascending TAA that measures 4.5 cm   No outpatient medications have been marked as taking for the 12/26/23 encounter (Office Visit) with Gilda Crease, Latina Craver, MD.    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    Past Surgical History:  Procedure Laterality Date   ABDOMINAL EXPLORATION SURGERY  1978   APPENDECTOMY     at same time as lymph node dissection   CARPAL TUNNEL RELEASE Right    COLONOSCOPY WITH PROPOFOL N/A 05/13/2022   Procedure: COLONOSCOPY WITH PROPOFOL;  Surgeon: Midge Minium, MD;  Location: [facility];  Service: Endoscopy;  Laterality: N/A;   CYSTOSCOPY W/ RETROGRADES Bilateral 04/22/2023   Procedure: CYSTOSCOPY WITH BILATERAL RETROGRADE PYELOGRAM;  Surgeon: Jerilee Field, MD;  Location: [facility];  Service: Urology;  Laterality: Bilateral;  60 MINS FOR CASES   CYSTOSCOPY WITH BIOPSY N/A 04/22/2023   Procedure: CYSTOSCOPY WITH BIOPSY/ TRANSURETHRAL RESECTION OF BLADDER TUMOR 2-5cm;  Surgeon: Jerilee Field, MD;  Location: [facility];  Service: Urology;  Laterality: N/A;   ESOPHAGOGASTRODUODENOSCOPY  12/2006   and colon   ESOPHAGOGASTRODUODENOSCOPY (EGD) WITH PROPOFOL Bilateral 05/13/2022   Procedure: ESOPHAGOGASTRODUODENOSCOPY (EGD) WITH PROPOFOL with balloon dilation;  Surgeon: Midge Minium, MD;  Location: [facility];  Service: Endoscopy;  Laterality: Bilateral;   INGUINAL HERNIA REPAIR  child   bilateral   JOINT REPLACEMENT Right    hip   LEFT HEART CATH AND CORONARY ANGIOGRAPHY Left 12/23/2021   Procedure: LEFT HEART CATH AND CORONARY ANGIOGRAPHY;  Surgeon: Marcina Millard, MD;  Location: [facility] INVASIVE CV LAB;  Service: Cardiovascular;  Laterality: Left;   MOHS SURGERY Left nose   at [facility] 2013   prolactinoma excision  06/2003   [facility]   right testicle removed  1978   SHOULDER OPEN ROTATOR CUFF REPAIR Left 03/2016  and biceps repair--[facility]   stress nuclear  negative   10/09   TONSILLECTOMY      Social History Social History   Tobacco Use   Smoking status: Former    Current packs/day: 0.00    Average packs/day: 1.5 packs/day for 40.0 years (60.0 ttl pk-yrs)    Types: Cigarettes    Start date: 04/25/1972    Quit date: 04/25/2012    Years since quitting: 11.6   Smokeless tobacco: Never  Vaping Use   Vaping status: Never Used  Substance Use Topics   Alcohol use: Yes    Comment: occasional   Drug use: Not Currently    Types: Marijuana    Comment: When BP is elevated. maybe twice a month    Family History Family History  Problem Relation Age of Onset   AAA (abdominal aortic aneurysm) Father    Brain cancer Father    Cancer Paternal Aunt        lung   Cancer Other        strong in family   Cancer Brother         melanoma   Cerebral aneurysm Paternal Grandfather    Osteoporosis Mother     Allergies  Allergen Reactions   Amlodipine Swelling    LE EDEMA   Sulfa Antibiotics     Low BP   Varenicline Tartrate     Irritability     REVIEW OF SYSTEMS (Negative unless checked)  Constitutional: [] Weight loss  [] Fever  [] Chills Cardiac: [] Chest pain   [] Chest pressure   [] Palpitations   [] Shortness of breath when laying flat   [] Shortness of breath with exertion. Vascular:  [x] Pain in legs with walking   [] Pain in legs at rest  [] History of DVT   [] Phlebitis   [] Swelling in legs   [] Varicose veins   [] Non-healing ulcers Pulmonary:   [] Uses home oxygen   [] Productive cough   [] Hemoptysis   [] Wheeze  [] COPD   [] Asthma Neurologic:  [] Dizziness   [] Seizures   [] History of stroke   [] History of TIA  [] Aphasia   [] Vissual changes   [] Weakness or numbness in arm   [] Weakness or numbness in leg Musculoskeletal:   [] Joint swelling   [] Joint pain   [] Low back pain Hematologic:  [] Easy bruising  [] Easy bleeding   [] Hypercoagulable state   [] Anemic Gastrointestinal:  [] Diarrhea   [] Vomiting  [] Gastroesophageal reflux/heartburn   [] Difficulty swallowing. Genitourinary:  [] Chronic kidney disease   [] Difficult urination  [] Frequent urination   [] Blood in urine Skin:  [] Rashes   [] Ulcers  Psychological:  [] History of anxiety   []  History of major depression.  Physical Examination  There were no vitals filed for this visit. There is no height or weight on file to calculate BMI. Gen: WD/WN, NAD Head: [place]/AT, No temporalis wasting.  Ear/Nose/Throat: Hearing grossly intact, nares w/o erythema or drainage Eyes: PER, EOMI, sclera nonicteric.  Neck: Supple, no masses.  No bruit or JVD.  Pulmonary:  Good air movement, no audible wheezing, no use of accessory muscles.  Cardiac: RRR, normal S1, S2, no Murmurs. Vascular: The popliteal pulse is normal bilaterally, no open wounds Vessel Right Left  Radial Palpable  Palpable  Gastrointestinal: soft, non-distended. No guarding/no peritoneal signs.  Musculoskeletal: M/S 5/5 throughout.  No visible deformity.  Neurologic: CN 2-12 intact. Pain and light touch intact in extremities.  Symmetrical.  Speech is fluent. Motor exam as listed above. Psychiatric: Judgment intact, Mood & affect appropriate for pt's clinical situation. Dermatologic:  No rashes or ulcers noted.  No changes consistent with cellulitis.   CBC Lab Results  Component Value Date   WBC 4.5 04/22/2023   HGB 15.3 04/22/2023   HCT 44.5 04/22/2023   MCV 92.9 04/22/2023   PLT 179 04/22/2023    BMET    Component Value Date/Time   NA 138 04/22/2023 0930   NA 141 04/19/2022 1044   NA 138 07/18/2014 1018   K 4.0 04/22/2023 0930   K 4.4 07/18/2014 1018   CL 105 04/22/2023 0930   CL 107 07/18/2014 1018   CO2 26 04/22/2023 0930   CO2 24 07/18/2014 1018   GLUCOSE 101 (H) 04/22/2023 0930   GLUCOSE 93 07/18/2014 1018   BUN 18 04/22/2023 0930   BUN 17 04/19/2022 1044   BUN 14 07/18/2014 1018   CREATININE 1.25 (H) 04/22/2023 0930   CREATININE 1.05 07/18/2014 1018   CALCIUM 8.5 (L) 04/22/2023 0930   CALCIUM 8.5 07/18/2014 1018   GFRNONAA >60 04/22/2023 0930   GFRNONAA >60 07/18/2014 1018   GFRNONAA >60 08/25/2012 0836   GFRAA 61 04/22/2020 0921   GFRAA >60 07/18/2014 1018   GFRAA >60 08/25/2012 0836   CrCl cannot be calculated (Patient's most recent lab result is older than the maximum 21 days allowed.).  COAG No results found for: "INR", "PROTIME"  Radiology No results found.   Assessment/Plan 1. Aneurysm of ascending aorta without rupture (HCC) (Primary) The patient is due for his CT scan to evaluate the size of his ascending thoracic aortic aneurysm.  CT scan will be ordered without contrast the patient will call the office to schedule follow-up after the scan is obtained. - CT CHEST WO CONTRAST; Future  2. Iliac artery aneurysm (HCC) Similar to his thoracic aneurysm I  will obtain a CT of the abdomen pelvis to evaluate his aorta and iliac aneurysmal disease.  Again the patient will call the office after this is scheduled. - CT ABDOMEN PELVIS WO CONTRAST; Future  3. Venous insufficiency of both lower extremities No surgery or intervention at this point in time.   The patient is CEAP [id number]    I have discussed with the patient venous insufficiency and why it  causes symptoms. I have discussed with the patient the chronic skin changes that accompany venous insufficiency and the long term sequela such as infection and ulceration.  Patient will begin wearing graduated compression stockings or compression wraps on a daily basis.  The patient will put the compression on first thing in the morning and removing them in the evening. The patient is instructed specifically not to sleep in the compression.     In addition, behavioral modification including several periods of elevation of the lower extremities during the day will be continued. I have demonstrated that proper elevation is a position with the ankles at heart level.   The patient is instructed to begin routine exercise, especially walking on a daily basis   The patient will be assessed for a Lymph Pump depending on the effectiveness of conservative therapy and the control of the associated lymphedema.  4. PAD (peripheral artery disease) (HCC) Recommend:   I do not find evidence of life style limiting vascular disease. The patient specifically denies life style limitation.   Previous noninvasive studies including ABI's of the legs do not identify critical vascular problems.   The patient should continue walking and begin a more formal exercise program. The patient should continue his antiplatelet therapy and aggressive treatment of the lipid  abnormalities.   The patient is instructed to call the office if there is a significant change in the lower extremity symptoms, particularly if a wound develops or  there is an abrupt increase in leg pain.  - VAS [place] ABI WITH/WO TBI; Future  5. Primary hypertension Continue antihypertensive medications as already ordered, these medications have been reviewed and there are no changes at this time.   Levora Dredge, MD  12/26/2023 8:43 AM

## 2024-01-24 ENCOUNTER — Ambulatory Visit
Admission: RE | Admit: 2024-01-24 | Discharge: 2024-01-24 | Disposition: A | Discharge status: Home / Self Care | Source: Ambulatory Visit | Attending: Vascular Surgery | Admitting: Vascular Surgery

## 2024-01-24 DIAGNOSIS — I723 Aneurysm of iliac artery: Secondary | ICD-10-CM | POA: Diagnosis present

## 2024-01-24 DIAGNOSIS — I7121 Aneurysm of the ascending aorta, without rupture: Secondary | ICD-10-CM | POA: Insufficient documentation

## 2024-02-19 NOTE — Progress Notes (Unsigned)
 MRN : [id number]  Dustin [name] is a 68 y.o. (03/27/1956) male who presents with chief complaint of check circulation.  History of Present Illness:   The patient presents to the office for evaluation of an ascending thoracic aortic aneurysm. The aneurysm was found incidentally by CT scan. Patient denies chest pain or unusual back pain, no other chest or abdominal complaints.  No history of an abrupt onset of a painful toe associated with blue discoloration.      No family history of TAA/AAA.    Patient denies amaurosis fugax or TIA symptoms.  There is no history of claudication or rest pain symptoms of the lower extremities.   The patient denies angina or shortness of breath.   Previous CT scan dated 12/07/2022, shows an ascending TAA that measures 4.5 cm   CT chest, abdomen and pelvis dated 01/24/2024 is read by me personally and shows an ascending aortic aneurysm that measures 4.1 cm and a right common iliac artery aneurysm that measures 1.6 cm  No outpatient medications have been marked as taking for the 02/20/24 encounter (Appointment) with Prescilla Brod, Ninette Basque, MD.    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    Past Surgical History:  Procedure Laterality Date   ABDOMINAL EXPLORATION SURGERY  1978   APPENDECTOMY     at same time as lymph node dissection   CARPAL TUNNEL RELEASE Right    COLONOSCOPY WITH PROPOFOL  N/A 05/13/2022   Procedure: COLONOSCOPY WITH PROPOFOL ;  Surgeon: Marnee Sink, MD;   Location: [facility];  Service: Endoscopy;  Laterality: N/A;   CYSTOSCOPY W/ RETROGRADES Bilateral 04/22/2023   Procedure: CYSTOSCOPY WITH BILATERAL RETROGRADE PYELOGRAM;  Surgeon: Christina Coyer, MD;  Location: [facility];  Service: Urology;  Laterality: Bilateral;  60 MINS FOR CASES   CYSTOSCOPY WITH BIOPSY N/A 04/22/2023   Procedure: CYSTOSCOPY WITH BIOPSY/ TRANSURETHRAL RESECTION OF BLADDER TUMOR 2-5cm;  Surgeon: Christina Coyer, MD;  Location: [facility];  Service: Urology;  Laterality: N/A;   ESOPHAGOGASTRODUODENOSCOPY  12/2006   and colon   ESOPHAGOGASTRODUODENOSCOPY (EGD) WITH PROPOFOL  Bilateral 05/13/2022   Procedure: ESOPHAGOGASTRODUODENOSCOPY (EGD) WITH PROPOFOL  with balloon dilation;  Surgeon: Marnee Sink, MD;  Location: [facility];  Service: Endoscopy;  Laterality: Bilateral;   INGUINAL HERNIA REPAIR  child   bilateral   JOINT REPLACEMENT Right    hip   LEFT HEART CATH AND CORONARY ANGIOGRAPHY Left 12/23/2021   Procedure: LEFT HEART CATH AND CORONARY ANGIOGRAPHY;  Surgeon: Percival Brace, MD;  Location: [facility];  Service: Cardiovascular;  Laterality: Left;   MOHS SURGERY  Left nose   at [facility]--- 2013   prolactinoma excision  06/2003   [facility]   right testicle removed  1978   SHOULDER OPEN ROTATOR CUFF REPAIR Left 03/2016   and biceps repair--[facility]   stress nuclear  negative   10/09   TONSILLECTOMY      Social History Social History   Tobacco Use   Smoking status: Former    Current packs/day: 0.00    Average packs/day: 1.5 packs/day for 40.0 years (60.0 ttl pk-yrs)    Types: Cigarettes    Start date: 04/25/1972    Quit date: 04/25/2012    Years since quitting: 11.8   Smokeless tobacco: Never  Vaping Use   Vaping status: Never Used  Substance Use Topics   Alcohol use: Yes    Comment: occasional   Drug use: Not Currently    Types: Marijuana    Comment: When BP is elevated. maybe twice a month    Family History Family  History  Problem Relation Age of Onset   AAA (abdominal aortic aneurysm) Father    Brain cancer Father    Cancer Paternal Aunt        lung   Cancer Other        strong in family   Cancer Brother        melanoma   Cerebral aneurysm Paternal Grandfather    Osteoporosis Mother     Allergies  Allergen Reactions   Amlodipine  Swelling    LE EDEMA   Sulfa  Antibiotics     Low BP   Varenicline Tartrate     Irritability     REVIEW OF SYSTEMS (Negative unless checked)  Constitutional: [] Weight loss  [] Fever  [] Chills Cardiac: [] Chest pain   [] Chest pressure   [] Palpitations   [] Shortness of breath when laying flat   [] Shortness of breath with exertion. Vascular:  [x] Pain in legs with walking   [] Pain in legs at rest  [] History of DVT   [] Phlebitis   [] Swelling in legs   [] Varicose veins   [] Non-healing ulcers Pulmonary:   [] Uses home oxygen   [] Productive cough   [] Hemoptysis   [] Wheeze  [] COPD   [] Asthma Neurologic:  [] Dizziness   [] Seizures   [] History of stroke   [] History of TIA  [] Aphasia   [] Vissual changes   [] Weakness or numbness in arm   [] Weakness or numbness in leg Musculoskeletal:   [] Joint swelling   [] Joint pain   [] Low back pain Hematologic:  [] Easy bruising  [] Easy bleeding   [] Hypercoagulable state   [] Anemic Gastrointestinal:  [] Diarrhea   [] Vomiting  [] Gastroesophageal reflux/heartburn   [] Difficulty swallowing. Genitourinary:  [] Chronic kidney disease   [] Difficult urination  [] Frequent urination   [] Blood in urine Skin:  [] Rashes   [] Ulcers  Psychological:  [] History of anxiety   []  History of major depression.  Physical Examination  There were no vitals filed for this visit. There is no height or weight on file to calculate BMI. Gen: WD/WN, NAD Head: [place]/AT, No temporalis wasting.  Ear/Nose/Throat: Hearing grossly intact, nares w/o erythema or drainage Eyes: PER, EOMI, sclera nonicteric.  Neck: Supple, no masses.  No bruit or JVD.  Pulmonary:  Good air  movement, no audible wheezing, no use of accessory muscles.  Cardiac: RRR, normal S1, S2, no Murmurs. Vascular:  mild trophic changes, no open wounds Vessel Right Left  Radial Palpable Palpable  PT Not Palpable Not Palpable  DP Not Palpable Not Palpable  Gastrointestinal: soft, non-distended. No guarding/no peritoneal  signs.  Musculoskeletal: M/S 5/5 throughout.  No visible deformity.  Neurologic: CN 2-12 intact. Pain and light touch intact in extremities.  Symmetrical.  Speech is fluent. Motor exam as listed above. Psychiatric: Judgment intact, Mood & affect appropriate for pt's clinical situation. Dermatologic: No rashes or ulcers noted.  No changes consistent with cellulitis.   CBC Lab Results  Component Value Date   WBC 4.5 04/22/2023   HGB 15.3 04/22/2023   HCT 44.5 04/22/2023   MCV 92.9 04/22/2023   PLT 179 04/22/2023    BMET    Component Value Date/Time   NA 138 04/22/2023 0930   NA 141 04/19/2022 1044   NA 138 07/18/2014 1018   K 4.0 04/22/2023 0930   K 4.4 07/18/2014 1018   CL 105 04/22/2023 0930   CL 107 07/18/2014 1018   CO2 26 04/22/2023 0930   CO2 24 07/18/2014 1018   GLUCOSE 101 (H) 04/22/2023 0930   GLUCOSE 93 07/18/2014 1018   BUN 18 04/22/2023 0930   BUN 17 04/19/2022 1044   BUN 14 07/18/2014 1018   CREATININE 1.25 (H) 04/22/2023 0930   CREATININE 1.05 07/18/2014 1018   CALCIUM  8.5 (L) 04/22/2023 0930   CALCIUM  8.5 07/18/2014 1018   GFRNONAA >60 04/22/2023 0930   GFRNONAA >60 07/18/2014 1018   GFRNONAA >60 08/25/2012 0836   GFRAA 61 04/22/2020 0921   GFRAA >60 07/18/2014 1018   GFRAA >60 08/25/2012 0836   CrCl cannot be calculated (Patient's most recent lab result is older than the maximum 21 days allowed.).  COAG No results found for: "INR", "PROTIME"  Radiology CT CHEST WO CONTRAST Result Date: 02/09/2024 CLINICAL DATA:  Follow-up ascending thoracic aortic aneurysm. EXAM: CT CHEST WITHOUT CONTRAST TECHNIQUE: Multidetector CT imaging of the  chest was performed following the standard protocol without IV contrast. RADIATION DOSE REDUCTION: This exam was performed according to the departmental dose-optimization program which includes automated exposure control, adjustment of the mA and/or kV according to patient size and/or use of iterative reconstruction technique. COMPARISON:  12/07/2022 FINDINGS: Cardiovascular: Atheromatous calcifications, including the coronary arteries and aorta. The ascending thoracic aorta currently measures 4.1 cm in maximum diameter. The central pulmonary arteries are mildly prominent with the main pulmonary artery measuring 3.2 cm in maximum diameter. Normal sized heart. No pericardial effusion. Mediastinum/Nodes: No enlarged mediastinal or axillary lymph nodes. Thyroid  gland, trachea, and esophagus demonstrate no significant findings. Lungs/Pleura: Lungs are clear. No pleural effusion or pneumothorax. Upper Abdomen: See the separate abdomen and pelvis CT report. Musculoskeletal: Thoracic and lower cervical spine degenerative changes. IMPRESSION: 1. 4.1 cm ascending thoracic aortic aneurysm. Recommend annual imaging followup by CTA or MRA. This recommendation follows 2010 ACCF/AHA/AATS/ACR/ASA/SCA/SCAI/SIR/STS/SVM Guidelines for the Diagnosis and Management of Patients with Thoracic Aortic Disease. Circulation. 2010; 121: Z610-R604. Aortic aneurysm NOS (ICD10-I71.9) 2. Mildly prominent central pulmonary arteries, which can be seen with pulmonary arterial hypertension. 3.  Calcific coronary artery and aortic atherosclerosis. Aortic Atherosclerosis (ICD10-I70.0). Electronically Signed   By: Catherin Closs M.D.   On: 02/09/2024 15:32   CT ABDOMEN PELVIS WO CONTRAST Result Date: 02/09/2024 CLINICAL DATA:  Follow-up abdominal aortic aneurysm. No current symptoms. History of bladder cancer. EXAM: CT ABDOMEN AND PELVIS WITHOUT CONTRAST TECHNIQUE: Multidetector CT imaging of the abdomen and pelvis was performed following the standard  protocol without IV contrast. RADIATION DOSE REDUCTION: This exam was performed according to the departmental dose-optimization program which includes automated exposure control, adjustment of the mA and/or kV according to patient size and/or use of  iterative reconstruction technique. COMPARISON:  CT abdomen and pelvis 06/01/2023. CT chest abdomen and pelvis 03/28/2022 FINDINGS: Lower chest: See additional report of CT chest, same date. Hepatobiliary: No focal liver abnormality is seen. No gallstones, gallbladder wall thickening, or biliary dilatation. Pancreas: Unremarkable. No pancreatic ductal dilatation or surrounding inflammatory changes. Spleen: Normal in size without focal abnormality. Adrenals/Urinary Tract: 1.9 cm diameter right adrenal gland nodule with macroscopic fat. Density measures -62 Hounsfield units. No change since prior study. This is consistent with a benign adenoma and no imaging follow-up is indicated. Kidneys are symmetrical. Focal scarring in the left kidney. No change. No renal, ureteral, or bladder stones. No hydronephrosis or hydroureter. Bladder is decompressed. Stomach/Bowel: Stomach, small bowel, and colon are not abnormally distended. No wall thickening or inflammatory changes. Appendix is not identified. Vascular/Lymphatic: Normal caliber abdominal aorta with scattered calcification. No aneurysm. No significant lymphadenopathy. Reproductive: Prostate gland is not enlarged. Other: No free air or free fluid in the abdomen. Minimal periumbilical hernia containing fat. Small left inguinal hernia containing fat. Musculoskeletal: Degenerative changes in the spine. No acute bony abnormalities. Postoperative right hip arthroplasty. IMPRESSION: 1. No acute process demonstrated in the abdomen or pelvis. 2. Aortic atherosclerosis. No evidence of aneurysm of the abdominal aorta. 3. No change since prior studies. Electronically Signed   By: Boyce Byes M.D.   On: 02/09/2024 15:31      Assessment/Plan There are no diagnoses linked to this encounter.   Devon Fogo, MD  02/19/2024 2:09 PM

## 2024-02-20 ENCOUNTER — Ambulatory Visit (INDEPENDENT_AMBULATORY_CARE_PROVIDER_SITE_OTHER): Admitting: Vascular Surgery

## 2024-02-20 ENCOUNTER — Encounter (INDEPENDENT_AMBULATORY_CARE_PROVIDER_SITE_OTHER): Payer: Self-pay | Admitting: Vascular Surgery

## 2024-02-20 VITALS — BP 132/82 | HR 77 | Resp 16 | Ht 70.0 in | Wt 224.2 lb

## 2024-02-20 DIAGNOSIS — I723 Aneurysm of iliac artery: Secondary | ICD-10-CM | POA: Diagnosis not present

## 2024-02-20 DIAGNOSIS — I7121 Aneurysm of the ascending aorta, without rupture: Secondary | ICD-10-CM | POA: Diagnosis not present

## 2024-02-20 DIAGNOSIS — I872 Venous insufficiency (chronic) (peripheral): Secondary | ICD-10-CM | POA: Diagnosis not present

## 2024-02-20 DIAGNOSIS — I10 Essential (primary) hypertension: Secondary | ICD-10-CM

## 2024-02-20 DIAGNOSIS — I2510 Atherosclerotic heart disease of native coronary artery without angina pectoris: Secondary | ICD-10-CM | POA: Diagnosis not present

## 2024-02-28 ENCOUNTER — Encounter (INDEPENDENT_AMBULATORY_CARE_PROVIDER_SITE_OTHER): Payer: Self-pay

## 2024-05-18 IMAGING — CR DG CHEST 2V
1 series · 2 of 2 positions shown · non-contrast
Comparison: 07/30/2020

CLINICAL DATA: Chest pain, dizziness

EXAM:
CHEST - 2 VIEW

[Series 1: dg chest 2 view · 0.14mm/px · 2 of 2 slices shown]
[im 1/2]
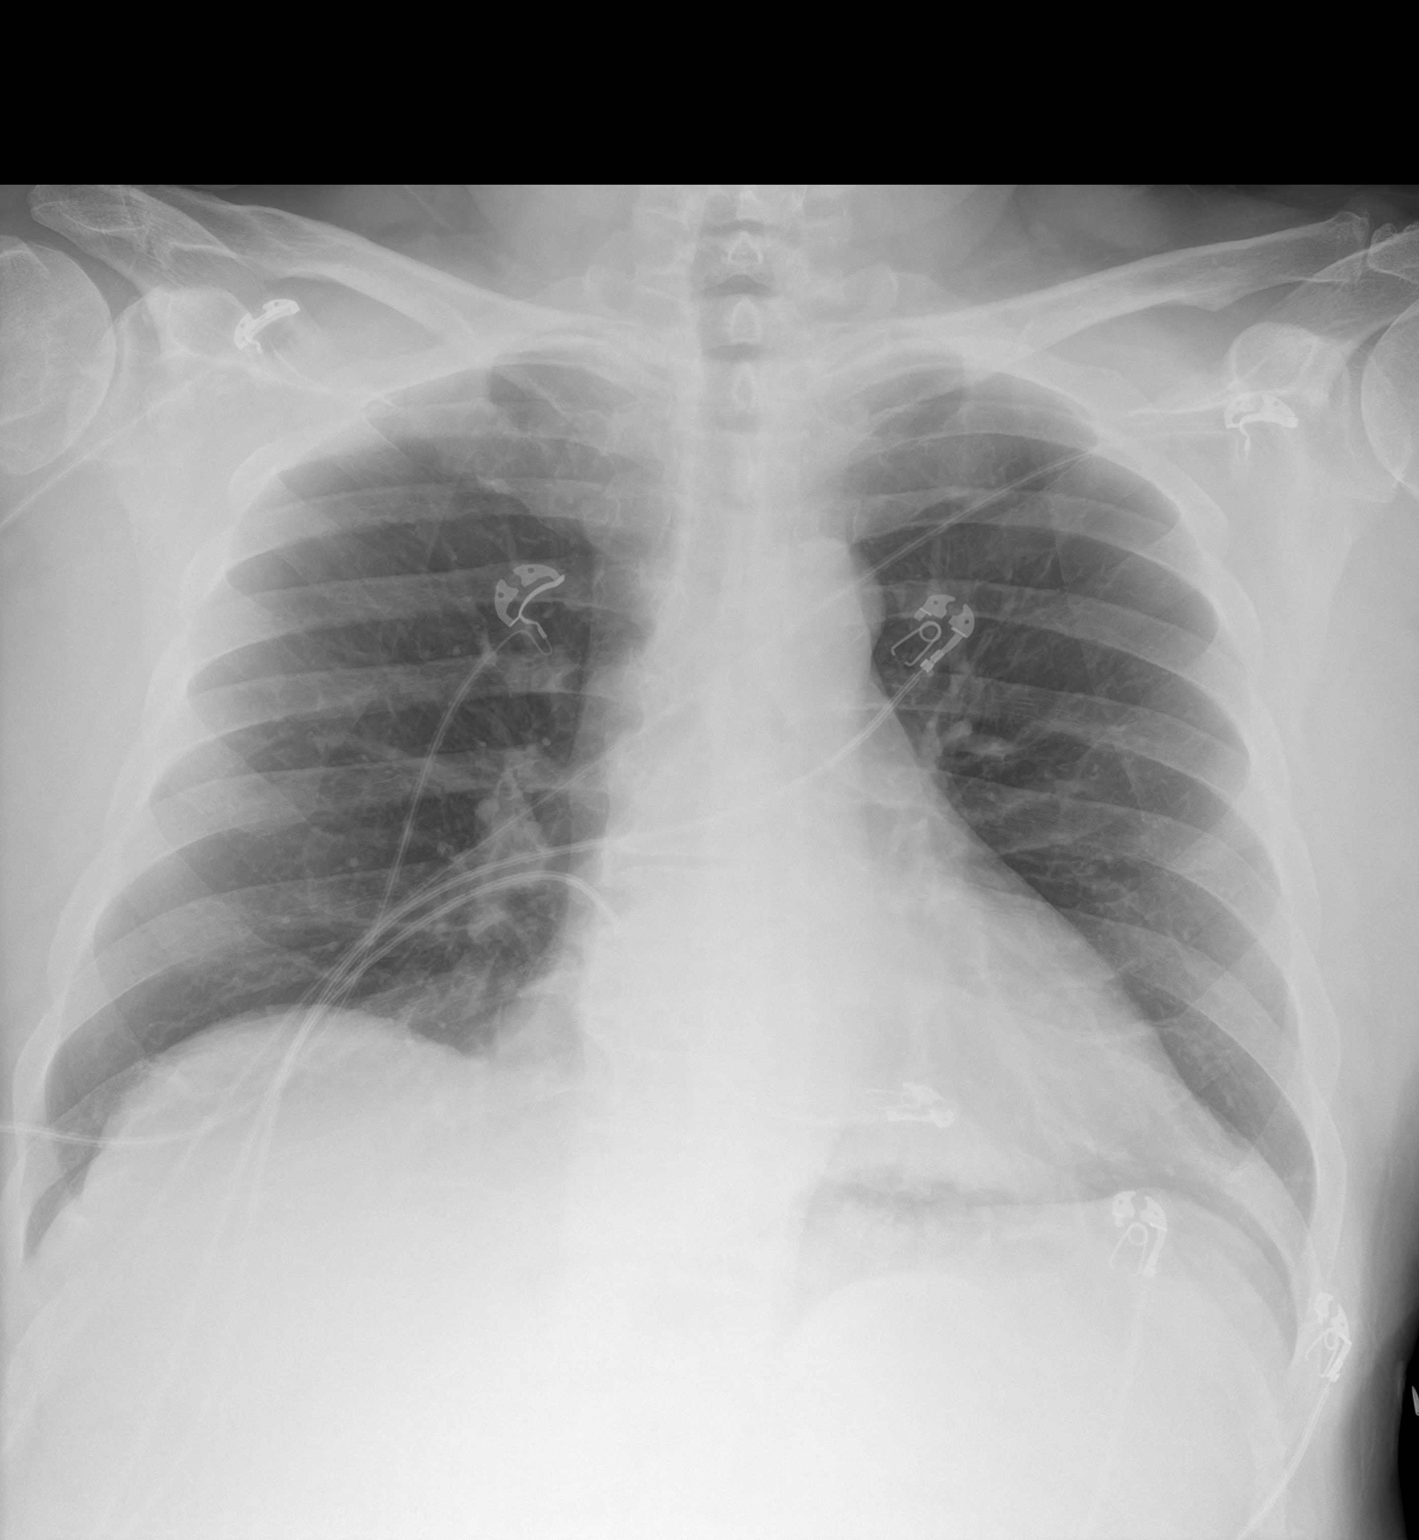
[im 2/2]
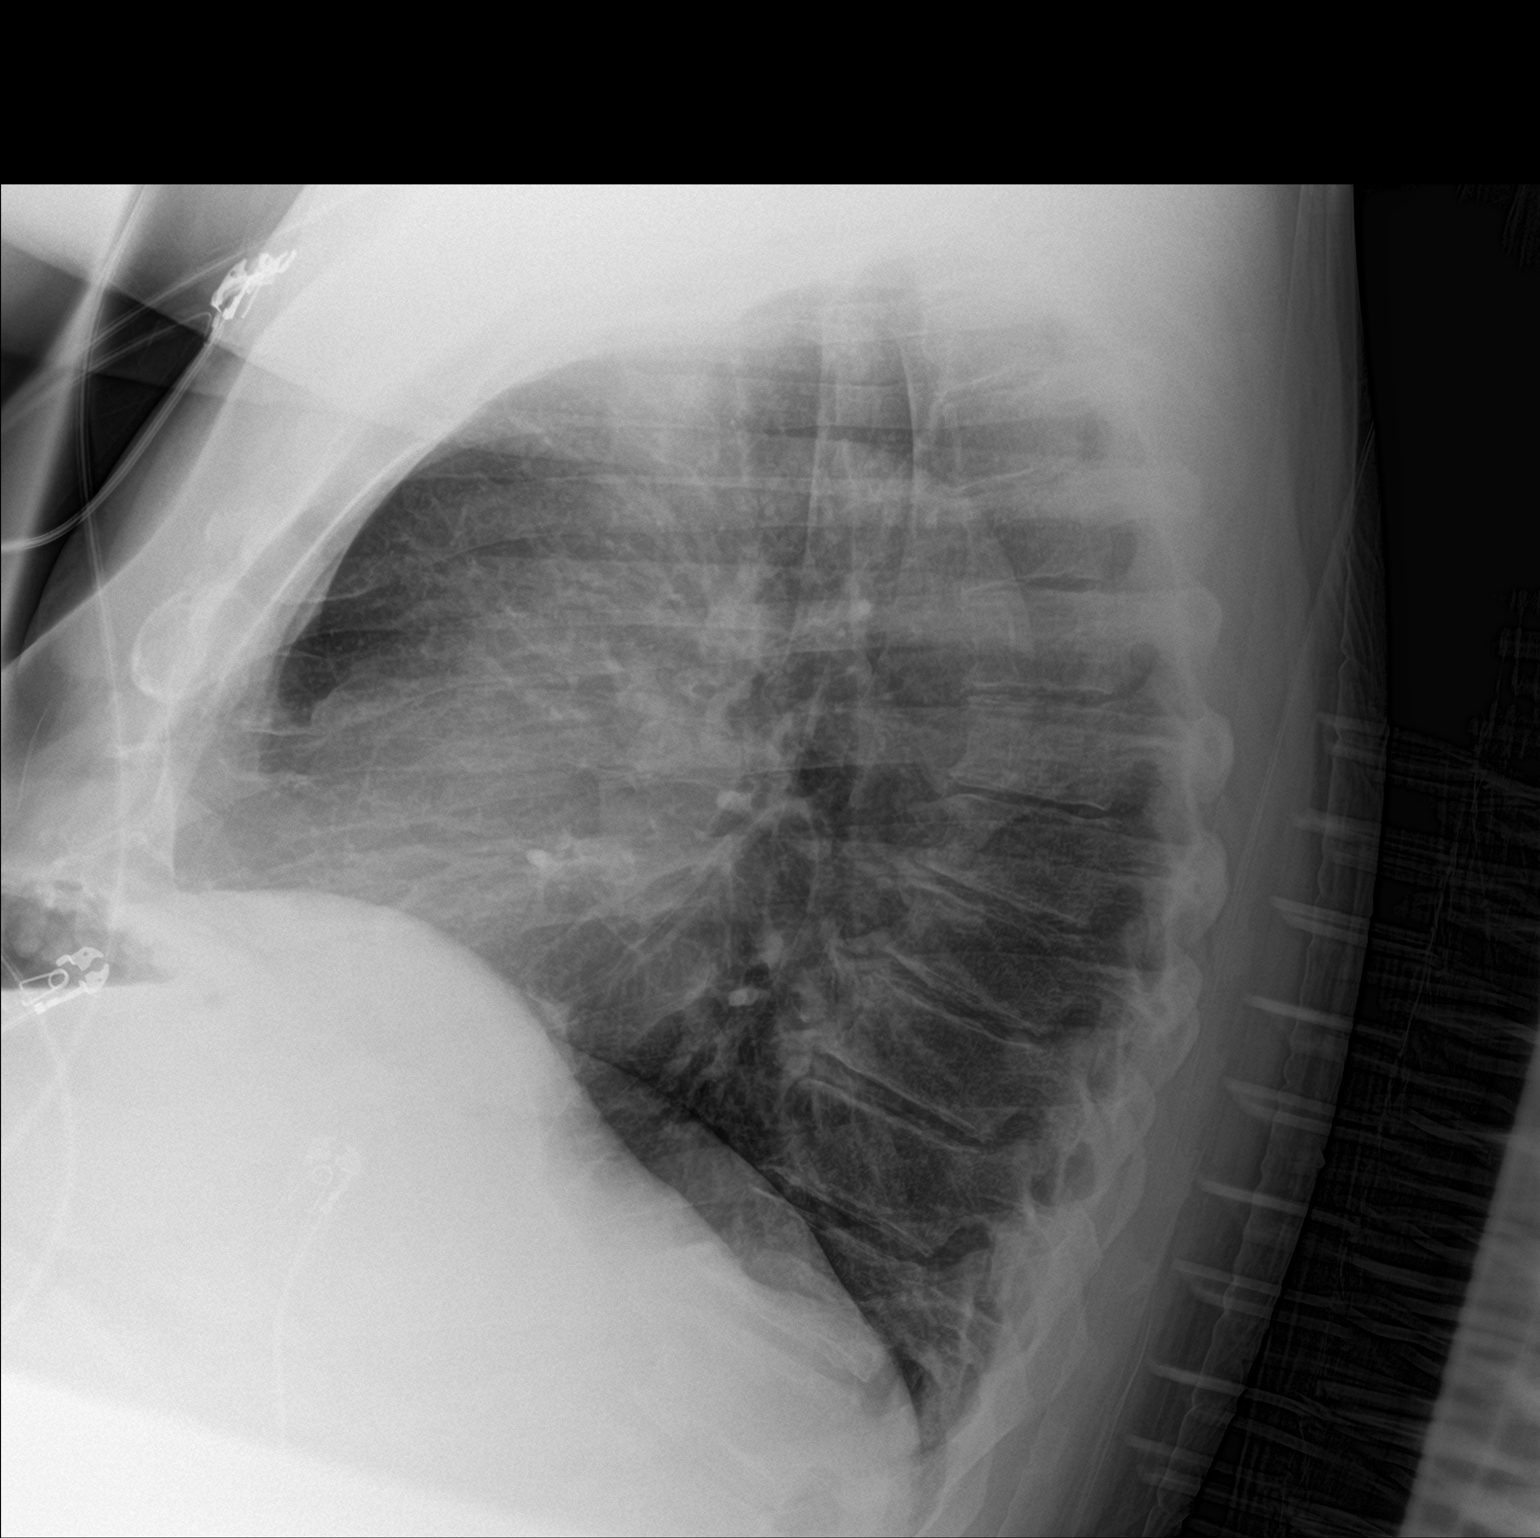

[2 of 2 positions shown; findings below may reference images not displayed]

FINDINGS: The heart size and mediastinal contours are within normal limits.
Both lungs are clear. The visualized skeletal structures are
unremarkable.
IMPRESSION: No active cardiopulmonary disease.

## 2024-09-28 NOTE — Progress Notes (Signed)
 Established Patient Visit   Chief Complaint: Chief Complaint  Patient presents with   Follow-up    3 month    Date of Service: 09/28/2024 Date of Birth: 1955-10-15 PCP: Bertrum Charlie Raring, MD  History of Present Illness: Mr. Dustin [name] is a 68 y.o.male patient who returns for    1.  Occluded OM1 12/23/2021   2.  Essential hypertension   3.  Hyperlipidemia   4.  Chronic kidney disease   5.  Statin intolerance   6.  History of cerebral aneurysm  7.  History of pituitary microadenoma  8.  History of testicular cancer status postchemotherapy  9.  Bladder cancer, stage II  The patient has recent history of neck injury falling off a horse, with lesion observed on CT scan T2-5, seen by Dr. Katrina, with initial recommendation for physical therapy. The patient has undergone cardiac evaluation, seen by Dustin Ports, PA. Coronary artery calcium  score 138 on 11/20/2020 which is 61 percentile.  Patient has a history of statin intolerance, and Repatha  was not covered, and Zetia  prescribed. Cardiac CT with FFR was performed 02/12/2021 which revealed 50% stenosis RCA, FFR 0.9, moderate stenosis left circumflex, FFR 0.89, LAD FFR 0.85.  The patient underwent cardiac catheterization on 12/23/2021 which revealed chronically occluded small to moderate caliber OM1, 40% stenosis proximal-mid left circumflex, and 30% stenosis mid RCA.  The patient was seen 07/27/2023 for acute visit with elevated blood pressure.  Metoprolol  succinate was uptitrated to 50 mg twice daily.  The patient returns today for follow-up, reports doing good.  He denies exertional chest pain.  He has mild chronic exertional dyspnea.  He denies palpitations or heart racing.  He denies peripheral edema.  The patient is active at work but does not exercise regularly.   The patient has essential hypertension, blood pressure well-controlled, on valsartan -HCTZ and metoprolol  succinate, which are well-tolerated without apparent side effects.   The patient reports she has had episodes of elevated blood pressure, as high as 184/104.  The patient tries to follow a low-sodium, no added salt diet.   The patient has hyperlipidemia; LDL was improved to 101 on 01/14/2023, on rosuvastatin  5 mg every other day, with history of statin intolerance.  The patient follows a low-cholesterol, low-fat diet.    Past Medical and Surgical History  Past Medical History Past Medical History:  Diagnosis Date   Actinic keratosis 12/29/2015   Last Assessment & Plan:  Liquid nitrogen 45 seconds x 2 Tolerated well Discussed home care   Cerebral aneurysm (HHS-HCC) 06/08/2007   Overview:  Qualifier: Diagnosis of  By: Jimmy MD, Charlie Scarlet   Last Assessment & Plan:  Found incidentally 10 years ago Strong FH of aneurysm death Will check MRA (discussed best option with neuroradiologist)   Depression 08/15/2018   Esophageal reflux 07/12/2008   Overview:  Qualifier: Diagnosis of  By: Copland MD, Jacques    Last Assessment & Plan:  Needs regular meds Can review with GI   Essential hypertension, benign 06/08/2007   Overview:  Qualifier: Diagnosis of  By: Jimmy MD, Charlie Scarlet   Last Assessment & Plan:  blood pressure control important in reducing the progression of atherosclerotic disease. On appropriate oral medications.   External hemorrhoid 09/13/2016   Last Assessment & Plan:  Will try cortisone cream   Family history of cerebral aneurysm    GERD (gastroesophageal reflux disease)    History of colonic polyps    Hyperlipidemia LDL goal <100 09/22/2009   Hypertension  Pituitary microadenoma (CMS/HHS-HCC) 05/12/2003   Overview:  Qualifier: Diagnosis of  By: Jimmy MD, Charlie Scarlet   Last Assessment & Plan:  Will recheck prolactin given nipple sensitivity (though no PE findings) No weakness detected on left side either   Primary osteoarthritis of first carpometacarpal joint of right hand 05/15/2018   Testicular cancer (CMS/HHS-HCC)     Past  Surgical History He has a past surgical history that includes Appendectomy; Hernia repair; Tonsillectomy; pituitary adenoma excision; aortic lymph node dissection; arthroscopy shoulder w/debridement (Left, 03/15/2016); arthroscopic rotator cuff repair (Left, 03/15/2016); egd (01/02/2007); Colonoscopy (01/02/2007, 04/18/2002); Colonoscopy (01/06/2017); neuroplasty &/or transposition median nerve at carpal tunnel (Right, 07/19/2017); arthroplasty hip total (Right, 08/14/2018); removal of right testicle due to testicular cancer; and Mohs surgery.   Medications and Allergies  Current Medications  Current Outpatient Medications  Medication Sig Dispense Refill   albuterol  MDI, PROVENTIL , VENTOLIN , PROAIR , HFA 90 mcg/actuation inhaler Inhale 2 inhalations into the lungs every 4 (four) hours as needed 6.7 g 0   ALPRAZolam  (XANAX ) 0.5 MG tablet Take 1 tablet (0.5 mg total) by mouth at bedtime as needed 30 tablet 4   brimonidine (LUMIFY) 0.025 % Drop Apply to eye     buPROPion  (WELLBUTRIN  XL) 300 MG XL tablet TAKE 1 TABLET BY MOUTH DAILY 90 tablet 3   metoprolol  SUCCinate (TOPROL -XL) 100 MG XL tablet Take 1 tablet (100 mg total) by mouth once daily 30 tablet 11   omeprazole  (PRILOSEC) 40 MG DR capsule TAKE 1 CAPSULE BY MOUTH DAILY 90 capsule 3   promethazine-dextromethorphan (PROMETHAZINE-DM) 6.25-15 mg/5 mL syrup Take 5 mLs by mouth every 6 (six) hours as needed 120 mL 0   semaglutide (OZEMPIC) 1 mg/dose (4 mg/3 mL) pen injector Inject 0.75 mLs (1 mg total) subcutaneously once a week 3 mL 5   sildenafil  (REVATIO ) 20 mg tablet      tadalafiL  (CIALIS ) 20 MG tablet Take 20 mg by mouth once daily     tamsulosin  (FLOMAX ) 0.4 mg capsule Take by mouth     TORsemide  (DEMADEX ) 10 MG tablet TAKE ONE TABLET BY MOUTH DAILY 90 tablet 1   valsartan -hydroCHLOROthiazide  (DIOVAN -HCT) 320-12.5 mg tablet TAKE 1 TABLET BY MOUTH DAILY 90 tablet 1   vibegron  (GEMTESA ) 75 mg Tab Take by mouth Taking every other  day     rosuvastatin  (CRESTOR ) 5 MG tablet Take 1 tablet (5 mg total) by mouth once daily (Patient not taking: Reported on 05/28/2024) 90 tablet 3   silodosin  (RAPAFLO ) 8 mg capsule Take 8 mg by mouth daily with breakfast (Patient not taking: Reported on 03/24/2023)     No current facility-administered medications for this visit.    Allergies: Amlodipine , Sulfa  (sulfonamide antibiotics), and Varenicline  Social and Family History  Social History  reports that he quit smoking about 13 years ago. His smoking use included cigarettes. He started smoking about 47 years ago. He has a 51 pack-year smoking history. He has been exposed to tobacco smoke. He quit smokeless tobacco use about 12 years ago. He reports that he does not currently use alcohol. He reports current drug use. Drug: Marijuana.  Family History Family History  Problem Relation Name Age of Onset   Rheum arthritis Mother Chritopher Coster    Osteoarthritis Mother Jessi Jessop    Colon polyps Mother Isiac Breighner    High blood pressure (Hypertension) Father Hilmer Aliberti. Edison Raddle.    Lung cancer Maternal Aunt Dickey Lowers    Rheum arthritis Maternal Grandmother Mable Elder  Osteoarthritis Maternal Grandmother Mable Elder    Anesthesia problems Neg Hx     Malignant hypertension Neg Hx     Malignant hyperthermia Neg Hx     Pseudochol deficiency Neg Hx     PONV Neg Hx      Review of Systems   Review of Systems: The patient reports chest pain, with chronic exertional shortness of breath, without orthopnea, paroxysmal nocturnal dyspnea, with intermittent pedal edema, with palpitations, heart racing, presyncope, syncope. Review of 8 Systems is negative except as described above.  Physical Examination   Vitals:BP 124/82   Pulse 71   Ht 177.8 cm (5' 10)   Wt (!) 101.6 kg (224 lb)   SpO2 98%   BMI 32.14 kg/m  Ht:177.8 cm (5' 10) Wt:(!) 101.6 kg (224 lb) ADJ:Anib surface area is 2.24 meters squared. Body mass  index is 32.14 kg/m.  General: Alert and oriented. Well-appearing. No acute distress. HEENT: Pupils equally reactive to light and accomodation    Neck: no JVD Lungs: Normal effort of breathing; clear to auscultation bilaterally; no wheezes, rales, rhonchi Heart: Regular rate and rhythm. No murmur, rub, or gallop Abdomen: soft nontender, nondistended Extremities: no cyanosis, clubbing, or edema Peripheral Pulses: 2+ radial  Skin: Warm, dry, no diaphoresis  Assessment   68 y.o. male with  1. Hyperlipidemia LDL goal <100   2. S/P cardiac catheterization   3. Iliac artery aneurysm   4. Essential hypertension, benign   5. Statin intolerance   6. Coronary artery disease involving native coronary artery of native heart without angina pectoris   7. Aneurysm of ascending aorta without rupture ()    68 year old gentleman with multiple cardiovascular risk factors, recent cardiac CTA revealing moderate coronary artery disease without hemodynamically significant stenosis. Cardiac catheterization 12/23/2021 revealed chronically occluded OM1, 40% stenosis proximal-mid left circumflex, and 30% stenosis mid RCA. The patient has essential hypertension, blood pressure periodically elevated.  The patient has hyperlipidemia, with history of statin intolerance, started on low-dose rosuvastatin  every other day.     Plan   1.  Continue current medications 2.  Counseled patient about low-sodium diet 3.  DASH diet printed instructions given to the patient 4.  Counseled patient about low-cholesterol diet 5.  Continue rosuvastatin  5 mg every other day 6.  Low-fat and cholesterol diet printed instructions given to the patient 7.  Continue metoprolol  succinate to 100 mg daily 8.  Return to clinic for follow-up in 4 months   No orders of the defined types were placed in this encounter.   Return in about 4 months (around 01/27/2025).  MARSA DOOMS, MD PhD [facility]

## 2024-10-18 ENCOUNTER — Other Ambulatory Visit: Payer: Self-pay | Admitting: Urology

## 2024-10-19 ENCOUNTER — Encounter (HOSPITAL_COMMUNITY): Payer: Self-pay

## 2024-10-19 NOTE — Progress Notes (Signed)
 COVID Vaccine received:  []  No [x]  Yes Date of any COVID positive Test in last 90 days:  PCP - Charlie Forte, MD Cardiologist - Marsa Dooms, MD Vascular- Cordella Shawl, MD    Chest x-ray - 03-28-2023  2v in CE,     CT Chest 01-24-2024 Epic EKG -  04-22-2023     Repeat Stress Test -  ECHO - 2018  Cardiac Cath - 12-23-2021  LHC / CORS  CT Coronary Calcium  score: 138 on 11-20-2020  Pacemaker / ICD device []  No []  Yes   Spinal Cord Stimulator:[]  No []  Yes       History of Sleep Apnea? []  No []  Yes   CPAP used?- []  No []  Yes    Medication on DOS: Metoprolol , Bupropion , Omeprazole ,  Eye Drops  Hold DOS: Valsartan - HCTZ,   Patient has: [x]  NO Hx DM   []  Pre-DM   []  DM1  []   DM2 Does the patient monitor blood sugar?   [x]  N/A   []  No []  Yes  Last A1c was:   5.4 normal on  05-17-2023     Semaglutide - already on Hold  Blood Thinner / Instructions: Aspirin  Instructions:  Activity level: Able to walk up 2 flights of stairs without becoming significantly short of breath or having chest pain?   []    Yes   []  No,  would have:  Patient can perform ADLs without assistance.  []   Yes    []  No  Comments:   Anesthesia review: CAD, CKD, HTN, GERD, PAD & venous insuff.  Depression  Patient denies any S&S of respiratory illness or Covid - no shortness of breath, fever, cough or chest pain at PAT appointment.  Patient verbalized understanding and agreement to the Pre-Surgical Instructions that were given to them at this PAT appointment. Patient was also educated of the need to review these PAT instructions again prior to his/her surgery.I reviewed the appropriate phone numbers to call if they have any and questions or concerns.

## 2024-10-21 NOTE — Patient Instructions (Signed)
 SURGICAL WAITING ROOM VISITATION Patients having surgery or a procedure may have no more than 2 support people in the waiting area - these visitors may rotate in the visitor waiting room.   If the patient needs to stay at the hospital during part of their recovery, the visitor guidelines for inpatient rooms apply.  PRE-OP VISITATION  Pre-op nurse will coordinate an appropriate time for 1 support person to accompany the patient in pre-op.  This support person may not rotate.  This visitor will be contacted when the time is appropriate for the visitor to come back in the pre-op area.  To keep our patients, visitors and teammates safe and prevent the spread of respiratory illnesses over the next few months.  Temporary Visitor Restrictions  Children ages 30 and under will not be able to visit patients in [facility] under most circumstances. Visitation is not restricted outside of hospitals unless noted otherwise in the [facility] and Location Specific Visitation Guidelines at:       [email or web address]  Visitors with respiratory illnesses are discouraged from visiting and should remain at home. You are not required to quarantine at this time prior to your surgery. However, you must do this: Hand Hygiene often Do NOT share personal items Notify your provider if you are in close contact with someone who has COVID or you develop fever 100.4 or greater, new onset of sneezing, cough, sore throat, shortness of breath or body aches.  If you test positive for Covid or have been in contact with anyone that has tested positive in the last 10 days please notify you surgeon.    Your procedure is scheduled on:  Tuesday  10-23-2024  Report to [facility] Main Entrance: [place] entrance where the [employer] is available.   Report to admitting at: 09:15    AM  Call this number if you have any questions or problems the morning of surgery [phone]  DO NOT EAT OR DRINK ANYTHING  AFTER MIDNIGHT THE NIGHT PRIOR TO YOUR SURGERY / PROCEDURE.   FOLLOW  ANY ADDITIONAL PRE OP INSTRUCTIONS YOU RECEIVED FROM YOUR SURGEON'S OFFICE!!!   Oral Hygiene is also important to reduce your risk of infection.        Remember - BRUSH YOUR TEETH THE MORNING OF SURGERY WITH YOUR REGULAR TOOTHPASTE  Do NOT smoke after Midnight the night before surgery.  STOP TAKING all Vitamins, Herbs and supplements 1 week before your surgery.   SEMAGLUTIDE- already on Hold  Take ONLY these medicines the morning of surgery with A SIP OF WATER :  Metoprolol , Bupropion , Omeprazole , and you may use your Eye Drops   DO NOT TAKE Valsartan - HCTZ, the morning of your surgery.                   You may not have any metal on your body including  jewelry, and body piercing  Do not wear  lotions, powders, cologne, or deodorant  Men may shave face and neck.  Contacts, Hearing Aids, dentures or bridgework may not be worn into surgery. DENTURES WILL BE REMOVED PRIOR TO SURGERY PLEASE DO NOT APPLY Poly grip OR ADHESIVES!!!  Patients discharged on the day of surgery will not be allowed to drive home.  Someone NEEDS to stay with you for the first 24 hours after anesthesia.  Do not bring your home medications to the hospital. The Pharmacy will dispense medications listed on your medication list to you during your admission in the Hospital.  Please  read over the following fact sheets you were given: IF YOU HAVE QUESTIONS ABOUT YOUR PRE-OP INSTRUCTIONS, PLEASE CALL [phone].   [facility] - Preparing for Surgery      Before surgery, you can play an important role.  Because skin is not sterile, your skin needs to be as free of germs as possible.  You can reduce the number of germs on your skin by washing with CHG (chlorahexidine gluconate) soap before surgery.  CHG is an antiseptic cleaner which kills germs and bonds with the skin to continue killing germs even after washing. Please DO NOT use if you have an  allergy to CHG or antibacterial soaps.  If your skin becomes reddened/irritated stop using the CHG and inform your nurse when you arrive at Short Stay. Do not shave (including legs and underarms) for at least 48 hours prior to the first CHG shower.  You may shave your face/neck.  Please follow these instructions carefully:  1.  Shower with CHG Soap the night before surgery ONLY (DO NOT USE THE CHG SOAP THE MORNING OF SURGERY).  2.  If you choose to wash your hair, wash your hair first as usual with your normal  shampoo.  3.  After you shampoo, rinse your hair and body thoroughly to remove the shampoo.                             4.  Use CHG as you would any other liquid soap.  You can apply chg directly to the skin and wash.  Gently with a scrungie or clean washcloth.  5.  Apply the CHG Soap to your body ONLY FROM THE NECK DOWN.   Do not use on face/ open                           Wound or open sores. Avoid contact with eyes, ears mouth and genitals (private parts).                       Wash face,  Genitals (private parts) with your normal soap.             6.  Wash thoroughly, paying special attention to the area where your  surgery  will be performed.  7.  Thoroughly rinse your body with warm water  from the neck down.  8.  DO NOT shower/wash with your normal soap after using and rinsing off the CHG Soap.                9.  Pat yourself dry with a clean towel.            10.  Wear clean pajamas.            11.  Place clean sheets on your bed the night of your first shower and do not  sleep with pets.  Day of Surgery : Do not apply any CHG, lotions/deodorants the morning of surgery.  Please wear clean clothes to the hospital/surgery center.   FAILURE TO FOLLOW THESE INSTRUCTIONS MAY RESULT IN THE CANCELLATION OF YOUR SURGERY  PATIENT SIGNATURE_________________________________  NURSE  SIGNATURE__________________________________  ________________________________________________________________________

## 2024-10-22 ENCOUNTER — Encounter (HOSPITAL_COMMUNITY): Payer: Self-pay

## 2024-10-22 ENCOUNTER — Encounter (HOSPITAL_COMMUNITY)
Admission: RE | Admit: 2024-10-22 | Discharge: 2024-10-22 | Disposition: A | Source: Ambulatory Visit | Attending: Urology

## 2024-10-22 ENCOUNTER — Other Ambulatory Visit: Payer: Self-pay

## 2024-10-22 VITALS — BP 160/88 | HR 64 | Temp 97.8°F | Resp 20 | Ht 70.0 in | Wt 222.0 lb

## 2024-10-22 DIAGNOSIS — Z8551 Personal history of malignant neoplasm of bladder: Secondary | ICD-10-CM | POA: Diagnosis not present

## 2024-10-22 DIAGNOSIS — I2510 Atherosclerotic heart disease of native coronary artery without angina pectoris: Secondary | ICD-10-CM | POA: Insufficient documentation

## 2024-10-22 DIAGNOSIS — Z8547 Personal history of malignant neoplasm of testis: Secondary | ICD-10-CM | POA: Diagnosis not present

## 2024-10-22 DIAGNOSIS — Z01818 Encounter for other preprocedural examination: Secondary | ICD-10-CM | POA: Insufficient documentation

## 2024-10-22 DIAGNOSIS — I129 Hypertensive chronic kidney disease with stage 1 through stage 4 chronic kidney disease, or unspecified chronic kidney disease: Secondary | ICD-10-CM | POA: Diagnosis not present

## 2024-10-22 DIAGNOSIS — Z87891 Personal history of nicotine dependence: Secondary | ICD-10-CM | POA: Diagnosis not present

## 2024-10-22 DIAGNOSIS — N189 Chronic kidney disease, unspecified: Secondary | ICD-10-CM | POA: Diagnosis not present

## 2024-10-22 DIAGNOSIS — I10 Essential (primary) hypertension: Secondary | ICD-10-CM

## 2024-10-22 HISTORY — DX: Peripheral vascular disease, unspecified: I73.9

## 2024-10-22 LAB — BASIC METABOLIC PANEL WITH GFR
Anion gap: 10 (ref 5–15)
BUN: 13 mg/dL (ref 8–23)
CO2: 26 mmol/L (ref 22–32)
Calcium: 9.3 mg/dL (ref 8.9–10.3)
Chloride: 101 mmol/L (ref 98–111)
Creatinine, Ser: 1.29 mg/dL — ABNORMAL HIGH (ref 0.61–1.24)
GFR, Estimated: >60 mL/min
Glucose, Bld: 106 mg/dL — ABNORMAL HIGH (ref 70–99)
Potassium: 4 mmol/L (ref 3.5–5.1)
Sodium: 138 mmol/L (ref 135–145)

## 2024-10-22 LAB — CBC
HCT: 45.9 % (ref 39.0–52.0)
Hemoglobin: 15.6 g/dL (ref 13.0–17.0)
MCH: 31.4 pg (ref 26.0–34.0)
MCHC: 34 g/dL (ref 30.0–36.0)
MCV: 92.4 fL (ref 80.0–100.0)
Platelets: 167 K/uL (ref 150–400)
RBC: 4.97 MIL/uL (ref 4.22–5.81)
RDW: 12.3 % (ref 11.5–15.5)
WBC: 5.9 K/uL (ref 4.0–10.5)
nRBC: 0 % (ref 0.0–0.2)

## 2024-10-22 NOTE — Progress Notes (Signed)
 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 The patient underwent cardiac catheterization on 12/23/2021 which revealed chronically occluded small to moderate caliber OM1, 40% stenosis proximal-mid left circumflex, and 30% stenosis mid RCA. Last seen by Dr. Ammon on 09/28/24. Noted to be stable. No chest pain but has mild chronic DOE. Active at work. Advised to continue current medicines and f/u in 4 months.  Seen by Vascular for hx of TAA. Seen by Dr. Jama on 02/20/24. Per Dr. 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 Advised f/u in 1 year.  VS: BP (!) 160/88 Comment: right arm sitting, hasn't had BP meds yet  Pulse 64   Temp 36.6 C (Oral)   Resp 20   Ht 5' 10 (1.778 m)   Wt 100.7 kg   SpO2 96%   BMI 31.85 kg/m   PROVIDERS: Bertrum Charlie CROME, MD   LABS: Labs reviewed: Acceptable for surgery. (all labs ordered are listed, but only abnormal results are displayed)  Labs Reviewed  BASIC METABOLIC PANEL WITH GFR - Abnormal; Notable for the following components:      Result Value   Glucose, Bld 106 (*)    Creatinine, Ser 1.29 (*)    All other components within normal limits   CBC     CT Chest 01/24/24:   IMPRESSION: 1. 4.1 cm ascending thoracic aortic aneurysm. Recommend annual imaging followup by CTA or MRA. This recommendation follows 2010 ACCF/AHA/AATS/ACR/ASA/SCA/SCAI/SIR/STS/SVM Guidelines for the Diagnosis and Management of Patients with Thoracic Aortic Disease. Circulation. 2010; 121: Z733-z630. Aortic aneurysm NOS (ICD10-I71.9) 2. Mildly prominent central pulmonary arteries, which can be seen with pulmonary arterial hypertension. 3.  Calcific coronary artery and aortic atherosclerosis.   Aortic Atherosclerosis (ICD10-I70.0).  CT Abdomen 01/24/24:  IMPRESSION: 1. No acute process demonstrated in the abdomen or pelvis. 2. Aortic atherosclerosis. No evidence of aneurysm of the abdominal aorta. 3. No change since prior studies.  EKG 10/22/24:  NSR Cannot r/o inferior infarct, age undetermined   LHC 12/23/2021:    Prox Cx to Mid Cx lesion is 40% stenosed.   Mid RCA lesion is 30% stenosed.   1st Mrg lesion is 100% stenosed.   The left ventricular systolic function is normal.   The left ventricular ejection fraction is 55-65% by visual estimate.   1.  One-vessel coronary artery disease with chronically occluded small caliber OM1 2.  Normal left ventricular function   Recommendations\   1.  Medical therapy 2.  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    Past Surgical History:  Procedure Laterality Date   ABDOMINAL EXPLORATION SURGERY  1978   APPENDECTOMY     at  same time as lymph node dissection   CARPAL TUNNEL RELEASE Right    COLONOSCOPY WITH PROPOFOL  N/A 05/13/2022   Procedure: COLONOSCOPY WITH PROPOFOL ;  Surgeon: Jinny Carmine, MD;  [facility];  Service: Endoscopy;  Laterality: N/A;   CYSTOSCOPY W/ RETROGRADES Bilateral 04/22/2023   Procedure: CYSTOSCOPY WITH BILATERAL RETROGRADE PYELOGRAM;  Surgeon: Nieves Cough, MD;  Location[facility];  Service: Urology;  Laterality: Bilateral;  60 MINS FOR CASES   CYSTOSCOPY WITH BIOPSY N/A 04/22/2023   Procedure: CYSTOSCOPY WITH BIOPSY/ TRANSURETHRAL RESECTION OF BLADDER TUMOR 2-5cm;  Surgeon: Nieves Cough, MD;  [facility];  Service: Urology;  Laterality: N/A;   ESOPHAGOGASTRODUODENOSCOPY  12/2006   and colon   ESOPHAGOGASTRODUODENOSCOPY (EGD) WITH PROPOFOL  Bilateral 05/13/2022   Procedure: ESOPHAGOGASTRODUODENOSCOPY (EGD) WITH PROPOFOL  with balloon dilation;  Surgeon: Jinny Carmine, MD;  Location: [facility];  Service: Endoscopy;  Laterality: Bilateral;   INGUINAL HERNIA REPAIR  child   bilateral   JOINT REPLACEMENT Right    hip   LEFT HEART CATH AND CORONARY ANGIOGRAPHY Left 12/23/2021   Procedure: LEFT HEART CATH AND CORONARY ANGIOGRAPHY;  Surgeon: Ammon Blunt, MD;  Location: [facility] INVASIVE CV LAB;  Service: Cardiovascular;  Laterality: Left;   MOHS SURGERY Left nose   at [facility]--- 2013   prolactinoma excision  06/2003   [facility]   right testicle removed  1978   SHOULDER OPEN ROTATOR CUFF REPAIR Left 03/2016   and biceps repair--[facility]   stress nuclear  negative   10/09   TONSILLECTOMY      MEDICATIONS:  Brimonidine Tartrate (LUMIFY) 0.025 % SOLN   buPROPion  (WELLBUTRIN  XL) 300 MG 24 hr tablet   metoprolol  succinate (TOPROL -XL) 100 MG 24 hr tablet   omeprazole  (PRILOSEC) 40 MG capsule   Semaglutide,0.25 or 0.5MG /DOS, 2 MG/3ML SOPN   tadalafil  (CIALIS ) 5 MG tablet   valsartan -hydrochlorothiazide  (DIOVAN -HCT) 320-12.5 MG tablet   No current  facility-administered medications for this encounter.   Burnard CHRISTELLA Odis DEVONNA [facility] Short Stay/Anesthesiology [facility] Phone [phone] 10/22/2024 10:56 AM        "

## 2024-10-23 ENCOUNTER — Ambulatory Visit (HOSPITAL_COMMUNITY): Admitting: Anesthesiology

## 2024-10-23 ENCOUNTER — Encounter (HOSPITAL_COMMUNITY): Payer: Self-pay | Admitting: Urology

## 2024-10-23 ENCOUNTER — Encounter (HOSPITAL_COMMUNITY): Admission: RE | Disposition: A | Payer: Self-pay | Source: Home / Self Care | Attending: Urology

## 2024-10-23 ENCOUNTER — Ambulatory Visit (HOSPITAL_COMMUNITY)

## 2024-10-23 ENCOUNTER — Encounter (HOSPITAL_COMMUNITY): Payer: Self-pay | Admitting: Medical

## 2024-10-23 ENCOUNTER — Ambulatory Visit (HOSPITAL_COMMUNITY): Admission: RE | Admit: 2024-10-23 | Discharge: 2024-10-23 | Disposition: A | Attending: Urology | Admitting: Urology

## 2024-10-23 ENCOUNTER — Other Ambulatory Visit: Payer: Self-pay

## 2024-10-23 DIAGNOSIS — N3289 Other specified disorders of bladder: Secondary | ICD-10-CM | POA: Diagnosis present

## 2024-10-23 DIAGNOSIS — F32A Depression, unspecified: Secondary | ICD-10-CM | POA: Insufficient documentation

## 2024-10-23 DIAGNOSIS — Z79899 Other long term (current) drug therapy: Secondary | ICD-10-CM | POA: Insufficient documentation

## 2024-10-23 DIAGNOSIS — K219 Gastro-esophageal reflux disease without esophagitis: Secondary | ICD-10-CM | POA: Insufficient documentation

## 2024-10-23 DIAGNOSIS — I10 Essential (primary) hypertension: Secondary | ICD-10-CM | POA: Insufficient documentation

## 2024-10-23 DIAGNOSIS — I2510 Atherosclerotic heart disease of native coronary artery without angina pectoris: Secondary | ICD-10-CM | POA: Insufficient documentation

## 2024-10-23 DIAGNOSIS — F419 Anxiety disorder, unspecified: Secondary | ICD-10-CM | POA: Diagnosis not present

## 2024-10-23 DIAGNOSIS — Z87891 Personal history of nicotine dependence: Secondary | ICD-10-CM | POA: Diagnosis not present

## 2024-10-23 DIAGNOSIS — F418 Other specified anxiety disorders: Secondary | ICD-10-CM | POA: Diagnosis not present

## 2024-10-23 HISTORY — PX: CYSTOSCOPY W/ RETROGRADES: SHX1426

## 2024-10-23 HISTORY — PX: CYSTOSCOPY WITH FULGERATION: SHX6638

## 2024-10-23 HISTORY — PX: CYSTOSCOPY WITH BIOPSY: SHX5122

## 2024-10-23 MED ORDER — CHLORHEXIDINE GLUCONATE 0.12 % MT SOLN
15.0000 mL | Freq: Once | OROMUCOSAL | Status: AC
  Administered 2024-10-23: 15 mL via OROMUCOSAL

## 2024-10-23 MED ORDER — DEXAMETHASONE SOD PHOSPHATE PF 10 MG/ML IJ SOLN
INTRAMUSCULAR | Status: DC | PRN
  Administered 2024-10-23: 10 mg via INTRAVENOUS

## 2024-10-23 MED ORDER — PROPOFOL 10 MG/ML IV BOLUS
INTRAVENOUS | Status: AC
  Filled 2024-10-23: qty 20

## 2024-10-23 MED ORDER — SODIUM CHLORIDE 0.9 % IR SOLN
Status: DC | PRN
  Administered 2024-10-23: 1000 mL

## 2024-10-23 MED ORDER — LIDOCAINE HCL URETHRAL/MUCOSAL 2 % EX GEL
CUTANEOUS | Status: AC
  Filled 2024-10-23: qty 30

## 2024-10-23 MED ORDER — IOHEXOL 300 MG/ML  SOLN
INTRAMUSCULAR | Status: DC | PRN
  Administered 2024-10-23: 20 mL via URETHRAL

## 2024-10-23 MED ORDER — CEFAZOLIN SODIUM-DEXTROSE 2-4 GM/100ML-% IV SOLN
2.0000 g | INTRAVENOUS | Status: AC
  Administered 2024-10-23: 2 g via INTRAVENOUS
  Filled 2024-10-23: qty 100

## 2024-10-23 MED ORDER — MIDAZOLAM HCL 5 MG/5ML IJ SOLN
INTRAMUSCULAR | Status: DC | PRN
  Administered 2024-10-23: 2 mg via INTRAVENOUS

## 2024-10-23 MED ORDER — LIDOCAINE HCL (PF) 2 % IJ SOLN
INTRAMUSCULAR | Status: AC
  Filled 2024-10-23: qty 5

## 2024-10-23 MED ORDER — LIDOCAINE HCL URETHRAL/MUCOSAL 2 % EX GEL
CUTANEOUS | Status: DC | PRN
  Administered 2024-10-23: 1 via TOPICAL

## 2024-10-23 MED ORDER — DEXAMETHASONE SOD PHOSPHATE PF 10 MG/ML IJ SOLN
INTRAMUSCULAR | Status: AC
  Filled 2024-10-23: qty 1

## 2024-10-23 MED ORDER — FENTANYL CITRATE (PF) 50 MCG/ML IJ SOSY: 25.0000 ug | PREFILLED_SYRINGE | INTRAMUSCULAR | Status: DC | PRN

## 2024-10-23 MED ORDER — FENTANYL CITRATE (PF) 100 MCG/2ML IJ SOLN
INTRAMUSCULAR | Status: DC | PRN
  Administered 2024-10-23 (×2): 50 ug via INTRAVENOUS

## 2024-10-23 MED ORDER — SODIUM CHLORIDE 0.9 % IR SOLN
Status: DC | PRN
  Administered 2024-10-23: 3000 mL via INTRAVESICAL

## 2024-10-23 MED ORDER — MIDAZOLAM HCL 2 MG/2ML IJ SOLN
INTRAMUSCULAR | Status: AC
  Filled 2024-10-23: qty 2

## 2024-10-23 MED ORDER — ONDANSETRON HCL 4 MG/2ML IJ SOLN
INTRAMUSCULAR | Status: AC
  Filled 2024-10-23: qty 2

## 2024-10-23 MED ORDER — LIDOCAINE HCL (PF) 2 % IJ SOLN
INTRAMUSCULAR | Status: DC | PRN
  Administered 2024-10-23: 80 mg via INTRADERMAL

## 2024-10-23 MED ORDER — ORAL CARE MOUTH RINSE: 15.0000 mL | Freq: Once | OROMUCOSAL | Status: AC

## 2024-10-23 MED ORDER — FENTANYL CITRATE (PF) 100 MCG/2ML IJ SOLN
INTRAMUSCULAR | Status: AC
  Filled 2024-10-23: qty 2

## 2024-10-23 MED ORDER — PROPOFOL 10 MG/ML IV BOLUS
INTRAVENOUS | Status: DC | PRN
  Administered 2024-10-23: 150 mg via INTRAVENOUS
  Administered 2024-10-23: 50 mg via INTRAVENOUS

## 2024-10-23 MED ORDER — PHENYLEPHRINE 80 MCG/ML (10ML) SYRINGE FOR IV PUSH (FOR BLOOD PRESSURE SUPPORT)
PREFILLED_SYRINGE | INTRAVENOUS | Status: DC | PRN
  Administered 2024-10-23 (×3): 80 ug via INTRAVENOUS

## 2024-10-23 MED ORDER — LACTATED RINGERS IV SOLN: INTRAVENOUS | Status: DC

## 2024-10-23 MED ORDER — STERILE WATER FOR IRRIGATION IR SOLN
Status: DC | PRN
  Administered 2024-10-23: 3000 mL via INTRAVESICAL

## 2024-10-23 NOTE — Op Note (Addendum)
 Preoperative diagnosis: Bladder erythema Postoperative diagnosis: Same  Procedure: Cystoscopy with bilateral retrograde pyelogram, bladder biopsy and fulguration up to 0.5 cm  Surgeon: Nieves  Anesthesia: General  Indication for procedure: Chips a 69 year old male with a history of bladder CIS.  He is undergoing BCG maintenance and had an increase in frequency and urgency  recently.  On surveillance cystoscopy in the office there was an erythematous patch that looked to be at his bladder neck anteriorly.  He is brought today for cystoscopy bladder biopsy and retrogrades in the OR.  Findings.  On exam the penis was circumcised without mass or lesion.  Scrotum appeared normal.  Glans and meatus appeared normal.  On DRE the prostate was small and no hard area or nodule.  On cystoscopy the urethra was unremarkable, the prostate was short and nonobstructive.  The bladder contained mild trabeculation but looked much better than in the office.  No distinct area of erythema and no papillary lesion.  No stone or foreign body in the bladder.  Trigone and ureteral orifices were in the normal orthotopic position with clear efflux.   Left retrograde pyelogram-this outlined a single ureter single collecting system unit without filling defect stricture or dilation.  Right retrograde pyelogram-this demonstrated a single ureter single collecting system unit without filling defect, stricture or dilation.  Description of procedure: After consent was obtained patient brought to the operating room.  After adequate anesthesia he was placed in lithotomy position and prepped and draped in the usual sterile fashion.  Timeout was performed to confirm the patient and procedure.  The cystoscope was passed per urethra and the bladder inspected.  I used a 30 and 70 degree lens to take a careful look.  We then switched back to the 30 and the left ureteral orifice was cannulated with open-ended catheter and retrograde  injection of contrast was performed.  Spot images were obtained.  Similarly the right ureteral orifice was cannulated and retrograde injection of contrast was performed with serial fluoroscopy spot imaging.  We then turned our attention to bladder biopsy.  I used the rigid biopsy forceps and with the bladder decompressed was able to biopsy anteriorly, right, posterior and left.  The biopsy sites were fulgurated with the Bugbee electrode.  I took a second look with the 70 to make sure the bladder neck biopsy was fulgurated and excellent hemostasis was noted.  I switched back to the 30 and again inspected the bladder and noted to have excellent hemostasis of all the biopsy sites.  No concern for perforation.  The bladder was drained and lidocaine  jelly instilled per urethra.  Exam/DRE was performed.  He was awakened taken the cover room in stable condition.  Complications: None  Blood loss: Minimal  Specimens to pathology: #1 bladder neck biopsy #2 right bladder biopsy #3 posterior bladder biopsy #4 left bladder biopsy  Disposition: Patient stable to PACU.  I discussed the procedure, postop care and follow-up with Stuart.

## 2024-10-23 NOTE — Anesthesia Procedure Notes (Addendum)
 Procedure Name: LMA Insertion Date/Time: 10/23/2024 12:19 PM  Performed by: Zulema Leita PARAS, CRNAPatient Re-evaluated:Patient Re-evaluated prior to induction Oxygen Delivery Method: Circle system utilized Preoxygenation: Pre-oxygenation with 100% oxygen Induction Type: IV induction Ventilation: Mask ventilation without difficulty LMA: LMA with gastric port inserted LMA Size: 4.0 Number of attempts: 1 Tube secured with: Tape Comments: Easy pass LMA

## 2024-10-23 NOTE — Anesthesia Preprocedure Evaluation (Addendum)
"                                    Anesthesia Evaluation  Patient identified by MRN, date of birth, ID band Patient awake    Reviewed: Allergy & Precautions, H&P , NPO status , Patient's Chart, lab work & pertinent test results, reviewed documented beta blocker date and time   Airway Mallampati: II  TM Distance: >3 FB Neck ROM: Full    Dental no notable dental hx. (+) Teeth Intact, Dental Advisory Given   Pulmonary former smoker   Pulmonary exam normal breath sounds clear to auscultation       Cardiovascular hypertension, Pt. on medications and Pt. on home beta blockers + CAD and + Peripheral Vascular Disease   Rhythm:Regular Rate:Normal     Neuro/Psych  Headaches  Anxiety Depression     negative psych ROS   GI/Hepatic Neg liver ROS,GERD  Medicated,,  Endo/Other  negative endocrine ROS  Patient on GLP-1 Agonist  Renal/GU Renal InsufficiencyRenal diseasenegative Renal ROS  negative genitourinary   Musculoskeletal negative musculoskeletal ROS (+)    Abdominal  (+) + obese  Peds  Hematology negative hematology ROS (+)   Anesthesia Other Findings PERSONAL HYISTORY OF BLADDER CANCER  Reproductive/Obstetrics negative OB ROS                              Anesthesia Physical Anesthesia Plan  ASA: 3  Anesthesia Plan: General   Post-op Pain Management: Ofirmev  IV (intra-op)*   Induction: Intravenous  PONV Risk Score and Plan: 3 and Ondansetron , Dexamethasone  and Treatment may vary due to age or medical condition  Airway Management Planned: LMA and Oral ETT  Additional Equipment:   Intra-op Plan:   Post-operative Plan: Extubation in OR  Informed Consent: I have reviewed the patients History and Physical, chart, labs and discussed the procedure including the risks, benefits and alternatives for the proposed anesthesia with the patient or authorized representative who has indicated his/her understanding and acceptance.      Dental advisory given  Plan Discussed with: CRNA  Anesthesia Plan Comments:          Anesthesia Quick Evaluation  "

## 2024-10-23 NOTE — Transfer of Care (Signed)
 Immediate Anesthesia Transfer of Care Note  Patient: Dustin [name]  Procedure(s) Performed: CYSTOSCOPY, WITH BIOPSY (Bladder) CYSTOSCOPY, WITH BLADDER FULGURATION (Bladder) CYSTOSCOPY, WITH RETROGRADE PYELOGRAM (Bilateral: Ureter)  Patient Location: PACU  Anesthesia Type:General  Level of Consciousness: awake, alert , and oriented  Airway & Oxygen Therapy: Patient Spontanous Breathing and Patient connected to face mask oxygen  Post-op Assessment: Report given to RN and Post -op Vital signs reviewed and stable  Post vital signs: Reviewed and stable  Last Vitals:  Vitals Value Taken Time  BP 167/89 10/23/24 13:27  Temp    Pulse 69 10/23/24 13:29  Resp 13 10/23/24 13:29  SpO2 100 % 10/23/24 13:29  Vitals shown include unfiled device data.  Last Pain:  Vitals:   10/23/24 0944  TempSrc:   PainSc: 0-No pain      Patients Stated Pain Goal: 5 (10/23/24 0944)  Complications: No notable events documented.

## 2024-10-23 NOTE — Interval H&P Note (Signed)
 History and Physical Interval Note:  10/23/2024 11:49 AM  Dustin [name]  has presented today for surgery, with the diagnosis of PERSONAL HYISTORY OF BLADDER CANCER.  The various methods of treatment have been discussed with the patient and family. After consideration of risks, benefits and other options for treatment, the patient has consented to  Procedures: CYSTOSCOPY, WITH BIOPSY (N/A) CYSTOSCOPY, WITH BLADDER FULGURATION (N/A) , bilateral RGP, as a surgical intervention.  The patient's history has been reviewed, patient examined, no change in status, stable for surgery.  I have reviewed the patient's chart and labs.  Questions were answered to the patient's satisfaction.  Discussed rationale for surveying the upper tracts as well as possible need for Foley catheter.  All questions answered.  He elects to proceed.     Donnice Brooks

## 2024-10-23 NOTE — Anesthesia Postprocedure Evaluation (Signed)
"   Anesthesia Post Note  Patient: Dustin [name]  Procedure(s) Performed: CYSTOSCOPY, WITH BIOPSY (Bladder) CYSTOSCOPY, WITH BLADDER FULGURATION (Bladder) CYSTOSCOPY, WITH RETROGRADE PYELOGRAM (Bilateral: Ureter)     Patient location during evaluation: PACU Anesthesia Type: General Level of consciousness: awake and alert Pain management: pain level controlled Vital Signs Assessment: post-procedure vital signs reviewed and stable Respiratory status: spontaneous breathing, nonlabored ventilation and respiratory function stable Cardiovascular status: blood pressure returned to baseline and stable Postop Assessment: no apparent nausea or vomiting Anesthetic complications: no   No notable events documented.  Last Vitals:  Vitals:   10/23/24 1330 10/23/24 1345  BP: (!) 152/79 (!) 165/91  Pulse: 68 66  Resp: 13 (!) 7  Temp:    SpO2: 98% 96%    Last Pain:  Vitals:   10/23/24 1345  TempSrc:   PainSc: 0-No pain                 Garnette FORBES Skillern      "

## 2024-10-24 ENCOUNTER — Encounter (HOSPITAL_COMMUNITY): Payer: Self-pay | Admitting: Urology

## 2024-10-25 LAB — SURGICAL PATHOLOGY

## 2025-02-18 ENCOUNTER — Encounter (INDEPENDENT_AMBULATORY_CARE_PROVIDER_SITE_OTHER)

## 2025-02-18 ENCOUNTER — Ambulatory Visit (INDEPENDENT_AMBULATORY_CARE_PROVIDER_SITE_OTHER): Admitting: Vascular Surgery
